# Patient Record
Sex: Male | Born: 1985 | Race: White | Hispanic: No | Marital: Single | Smoking: Current every day smoker
Health system: Southern US, Community
[De-identification: ages and names within clinical notes are randomized; demographics above are authoritative.]

## PROBLEM LIST (undated history)

## (undated) DIAGNOSIS — F411 Generalized anxiety disorder: Secondary | ICD-10-CM

## (undated) DIAGNOSIS — Z8659 Personal history of other mental and behavioral disorders: Secondary | ICD-10-CM

## (undated) DIAGNOSIS — R454 Irritability and anger: Secondary | ICD-10-CM

## (undated) DIAGNOSIS — I10 Essential (primary) hypertension: Secondary | ICD-10-CM

## (undated) DIAGNOSIS — F3289 Other specified depressive episodes: Secondary | ICD-10-CM

## (undated) DIAGNOSIS — F329 Major depressive disorder, single episode, unspecified: Secondary | ICD-10-CM

## (undated) DIAGNOSIS — F988 Other specified behavioral and emotional disorders with onset usually occurring in childhood and adolescence: Secondary | ICD-10-CM

## (undated) DIAGNOSIS — F32A Depression, unspecified: Secondary | ICD-10-CM

## (undated) DIAGNOSIS — F22 Delusional disorders: Secondary | ICD-10-CM

## (undated) HISTORY — PX: HX HERNIA REPAIR: SHX51

## (undated) HISTORY — PX: HERNIA REPAIR: SHX51

---

## 2015-10-21 ENCOUNTER — Emergency Department
Admission: EM | Admit: 2015-10-21 | Discharge: 2015-10-21 | Disposition: A | Discharge status: Home / Self Care | Payer: Self-pay | Attending: Emergency Medicine | Admitting: Emergency Medicine

## 2015-10-21 ENCOUNTER — Encounter: Payer: Self-pay | Admitting: Emergency Medicine

## 2015-10-21 DIAGNOSIS — Z5181 Encounter for therapeutic drug level monitoring: Secondary | ICD-10-CM | POA: Insufficient documentation

## 2015-10-21 DIAGNOSIS — F988 Other specified behavioral and emotional disorders with onset usually occurring in childhood and adolescence: Secondary | ICD-10-CM

## 2015-10-21 DIAGNOSIS — F17210 Nicotine dependence, cigarettes, uncomplicated: Secondary | ICD-10-CM | POA: Insufficient documentation

## 2015-10-21 DIAGNOSIS — F909 Attention-deficit hyperactivity disorder, unspecified type: Secondary | ICD-10-CM | POA: Insufficient documentation

## 2015-10-21 DIAGNOSIS — F341 Dysthymic disorder: Secondary | ICD-10-CM

## 2015-10-21 DIAGNOSIS — F32A Depression, unspecified: Secondary | ICD-10-CM

## 2015-10-21 DIAGNOSIS — F329 Major depressive disorder, single episode, unspecified: Secondary | ICD-10-CM | POA: Insufficient documentation

## 2015-10-21 DIAGNOSIS — R45851 Suicidal ideations: Secondary | ICD-10-CM

## 2015-10-21 HISTORY — DX: Depression, unspecified: F32.A

## 2015-10-21 HISTORY — DX: Delusional disorders: F22

## 2015-10-21 HISTORY — DX: Major depressive disorder, single episode, unspecified: F32.9

## 2015-10-21 HISTORY — DX: Other specified behavioral and emotional disorders with onset usually occurring in childhood and adolescence: F98.8

## 2015-10-21 LAB — SALICYLATE LEVEL: Salicylate Lvl: <7 mg/dL (ref 2.8–30.0)

## 2015-10-21 LAB — URINE DRUG SCREEN, QUALITATIVE (ARMC ONLY)
AMPHETAMINES, UR SCREEN: NOT DETECTED
Barbiturates, Ur Screen: NOT DETECTED
Benzodiazepine, Ur Scrn: NOT DETECTED
CANNABINOID 50 NG, UR ~~LOC~~: NOT DETECTED
Cocaine Metabolite,Ur ~~LOC~~: NOT DETECTED
MDMA (ECSTASY) UR SCREEN: NOT DETECTED
Methadone Scn, Ur: NOT DETECTED
OPIATE, UR SCREEN: NOT DETECTED
PHENCYCLIDINE (PCP) UR S: NOT DETECTED
Tricyclic, Ur Screen: NOT DETECTED

## 2015-10-21 LAB — COMPREHENSIVE METABOLIC PANEL
ALBUMIN: 4.5 g/dL (ref 3.5–5.0)
ALK PHOS: 59 U/L (ref 38–126)
ALT: 20 U/L (ref 17–63)
AST: 19 U/L (ref 15–41)
Anion gap: 7 (ref 5–15)
BILIRUBIN TOTAL: 0.7 mg/dL (ref 0.3–1.2)
BUN: 10 mg/dL (ref 6–20)
CO2: 28 mmol/L (ref 22–32)
CREATININE: 1.19 mg/dL (ref 0.61–1.24)
Calcium: 9.8 mg/dL (ref 8.9–10.3)
Chloride: 104 mmol/L (ref 101–111)
GFR calc Af Amer: >60 mL/min (ref >60)
GLUCOSE: 140 mg/dL — AB (ref 65–99)
Potassium: 4.5 mmol/L (ref 3.5–5.1)
Sodium: 139 mmol/L (ref 135–145)
TOTAL PROTEIN: 7.2 g/dL (ref 6.5–8.1)

## 2015-10-21 LAB — CBC
HEMATOCRIT: 49.5 % (ref 40.0–52.0)
Hemoglobin: 17.1 g/dL (ref 13.0–18.0)
MCH: 31.5 pg (ref 26.0–34.0)
MCHC: 34.5 g/dL (ref 32.0–36.0)
MCV: 91.4 fL (ref 80.0–100.0)
PLATELETS: 212 10*3/uL (ref 150–440)
RBC: 5.41 MIL/uL (ref 4.40–5.90)
RDW: 13.1 % (ref 11.5–14.5)
WBC: 7.6 10*3/uL (ref 3.8–10.6)

## 2015-10-21 LAB — ETHANOL

## 2015-10-21 LAB — ACETAMINOPHEN LEVEL: Acetaminophen (Tylenol), Serum: <10 ug/mL — ABNORMAL LOW (ref 10–30)
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
## 2015-10-21 NOTE — ED Provider Notes (Signed)
-----------------------------------------   4:49 PM on 10/21/2015 -----------------------------------------  The patient has been seen and evaluated by psychiatry and believe the patient is safe for discharge home. Patient has been medically cleared. We'll discharge with [facility] follow-up as needed.   Minna AntisKevin Bodhi Stenglein, MD 10/21/15 1650

## 2015-12-03 ENCOUNTER — Ambulatory Visit: Payer: Self-pay

## 2019-12-28 ENCOUNTER — Other Ambulatory Visit: Payer: Self-pay

## 2019-12-28 ENCOUNTER — Encounter: Payer: Self-pay | Admitting: Emergency Medicine

## 2019-12-28 ENCOUNTER — Emergency Department
Admission: EM | Admit: 2019-12-28 | Discharge: 2019-12-28 | Disposition: A | Discharge status: Home / Self Care | Payer: Self-pay | Attending: Emergency Medicine | Admitting: Emergency Medicine

## 2019-12-28 DIAGNOSIS — J3489 Other specified disorders of nose and nasal sinuses: Secondary | ICD-10-CM | POA: Insufficient documentation

## 2019-12-28 DIAGNOSIS — R059 Cough, unspecified: Secondary | ICD-10-CM | POA: Insufficient documentation

## 2019-12-28 DIAGNOSIS — F17210 Nicotine dependence, cigarettes, uncomplicated: Secondary | ICD-10-CM | POA: Insufficient documentation

## 2019-12-28 DIAGNOSIS — R42 Dizziness and giddiness: Secondary | ICD-10-CM | POA: Insufficient documentation

## 2019-12-28 DIAGNOSIS — J029 Acute pharyngitis, unspecified: Secondary | ICD-10-CM | POA: Insufficient documentation

## 2019-12-28 LAB — CBC
HCT: 47.7 % (ref 39.0–52.0)
Hemoglobin: 15.8 g/dL (ref 13.0–17.0)
MCH: 30.9 pg (ref 26.0–34.0)
MCHC: 33.1 g/dL (ref 30.0–36.0)
MCV: 93.3 fL (ref 80.0–100.0)
Platelets: 232 10*3/uL (ref 150–400)
RBC: 5.11 MIL/uL (ref 4.22–5.81)
RDW: 13 % (ref 11.5–15.5)
WBC: 7.5 10*3/uL (ref 4.0–10.5)
nRBC: 0 % (ref 0.0–0.2)

## 2019-12-28 LAB — BASIC METABOLIC PANEL
Anion gap: 12 (ref 5–15)
BUN: 10 mg/dL (ref 6–20)
CO2: 22 mmol/L (ref 22–32)
Calcium: 8.7 mg/dL — ABNORMAL LOW (ref 8.9–10.3)
Chloride: 105 mmol/L (ref 98–111)
Creatinine, Ser: 0.91 mg/dL (ref 0.61–1.24)
GFR, Estimated: >60 mL/min (ref >60)
Glucose, Bld: 96 mg/dL (ref 70–99)
Potassium: 4.2 mmol/L (ref 3.5–5.1)
Sodium: 139 mmol/L (ref 135–145)

## 2019-12-28 LAB — CBG MONITORING, ED: Glucose-Capillary: 100 mg/dL — ABNORMAL HIGH (ref 70–99)

## 2019-12-28 NOTE — ED Notes (Signed)
Pts UDS collected per [employer]; walked to lab by this tech.

## 2019-12-28 NOTE — ED Provider Notes (Signed)
[facility] Emergency Department Provider Note   ____________________________________________   Event Date/Time   First MD Initiated Contact with Patient 12/28/19 1138     (approximate)  I have reviewed the triage vital signs and the nursing notes.   HISTORY  Chief Complaint Dizziness    HPI Joshua [name] is a 34 y.o. male with a stated past medical history of ADHD who presents for lightheadedness that occurred while at work earlier today.  Patient states that he did have a heavy night of drinking prior to this.  Patient states he is now having lightheadedness with any exertion.  Patient also notes associated cough, rhinorrhea, and mild sore throat that began yesterday and has been worsening.  Patient also states that his supervisor sent paperwork requesting a urine drug screen for Worker's Comp.  Patient was informed that he would need to see a primary care physician.  Patient currently denies any vision changes, tinnitus, difficulty speaking, facial droop, sore throat, chest pain, shortness of breath, abdominal pain, nausea/vomiting/diarrhea, dysuria, or weakness/numbness/paresthesias in any extremity         Past Medical History:  Diagnosis Date   ADD (attention deficit disorder)    Depression    Paranoid (HCC)     Patient Active Problem List   Diagnosis Date Noted   ADD (attention deficit disorder) 10/21/2015   Dysthymia 10/21/2015    Past Surgical History:  Procedure Laterality Date   HERNIA REPAIR      Prior to Admission medications   Not on File    Allergies Patient has no known allergies.  History reviewed. No pertinent family history.  Social History Social History   Tobacco Use   Smoking status: Current Every Day Smoker    Packs/day: 0.50    Types: Cigarettes   Smokeless tobacco: Never Used  Substance Use Topics   Alcohol use: Yes    Comment: occassional 24oz beer at night   Drug use: No    Review of  Systems Constitutional: No fever/chills Eyes: No visual changes. ENT: No sore throat. Cardiovascular: Denies chest pain. Respiratory: Denies shortness of breath. Gastrointestinal: No abdominal pain.  No nausea, no vomiting.  No diarrhea. Genitourinary: Negative for dysuria. Musculoskeletal: Negative for acute arthralgias Skin: Negative for rash. Neurological: Negative for headaches, weakness/numbness/paresthesias in any extremity Psychiatric: Negative for suicidal ideation/homicidal ideation   ____________________________________________   PHYSICAL EXAM:  VITAL SIGNS: ED Triage Vitals [12/28/19 0935]  Enc Vitals Group     BP 124/82     Pulse Rate 79     Resp 18     Temp 99.1 F (37.3 C)     Temp Source Oral     SpO2 99 %     Weight 161 lb (73 kg)     Height 5\' 4"  (1.626 m)     Head Circumference      Peak Flow      Pain Score 7     Pain Loc      Pain Edu?      Excl. in GC?    Constitutional: Alert and oriented. Well appearing and in no acute distress. Eyes: Conjunctivae are normal. PERRL. Head: Atraumatic. Nose: No congestion/rhinnorhea. Mouth/Throat: Mucous membranes are moist.  Erythematous posterior oropharynx Neck: No stridor Cardiovascular: Grossly normal heart sounds.  Good peripheral circulation. Respiratory: Normal respiratory effort.  No retractions. Gastrointestinal: Soft and nontender. No distention. Musculoskeletal: No obvious deformities Neurologic:  Normal speech and language. No gross focal neurologic deficits are appreciated. Skin:  Skin is warm and dry. No rash noted. Psychiatric: Mood and affect are normal. Speech and behavior are normal.  ____________________________________________   LABS (all labs ordered are listed, but only abnormal results are displayed)  Labs Reviewed  BASIC METABOLIC PANEL - Abnormal; Notable for the following components:      Result Value   Calcium 8.7 (*)    All other components within normal limits  CBG  MONITORING, ED - Abnormal; Notable for the following components:   Glucose-Capillary 100 (*)    All other components within normal limits  CBC  URINALYSIS, COMPLETE (UACMP) WITH MICROSCOPIC   ____________________________________________  EKG  ED ECG REPORT I, Merwyn Katos, the attending physician, personally viewed and interpreted this ECG.  Date: 12/28/2019 EKG Time: 0931 Rate: 82 Rhythm: normal sinus rhythm QRS Axis: normal Intervals: normal ST/T Wave abnormalities: normal Narrative Interpretation: no evidence of acute ischemia    PROCEDURES  Procedure(s) performed (including Critical Care):  .1-3 Lead EKG Interpretation Performed by: Merwyn Katos, MD Authorized by: Merwyn Katos, MD     Interpretation: normal     ECG rate:  81   ECG rate assessment: normal     Rhythm: sinus rhythm     Ectopy: none     Conduction: normal       ____________________________________________   INITIAL IMPRESSION / ASSESSMENT AND PLAN / ED COURSE  As part of my medical decision making, I reviewed the following data within the electronic MEDICAL RECORD NUMBER Nursing notes reviewed and incorporated, Labs reviewed, EKG interpreted, Old chart reviewed, and Notes from prior ED visits reviewed and incorporated      Patient presents with complaints of lightheadedness ED Workup:  CBC, BMP, Troponin, ECG Differential diagnosis includes HF, ICH, seizure, stroke, HOCM, ACS, aortic dissection, malignant arrhythmia, or GI bleed. Findings: No evidence of acute laboratory abnormalities.  Troponin negative x1 EKG: No e/o STEMI. No evidence of Brugadas sign, delta wave, epsilon wave, significantly prolonged QTc, or malignant arrhythmia. Patient has history and symptoms concerning for upper respiratory infection.  Patient was offered Covid/flu testing here and denied. Disposition: Discharge. Patient is at baseline at this time. Return precautions expressed and understood in person. Advised  follow up with primary care provider or clinic physician in next 24 hours.     ____________________________________________   FINAL CLINICAL IMPRESSION(S) / ED DIAGNOSES  Final diagnoses:  Lightheadedness  Rhinorrhea  Cough     ED Discharge Orders    None       Note:  This document was prepared using Dragon voice recognition software and may include unintentional dictation errors.   Merwyn Katos, MD 12/28/19 [phone]

## 2019-12-28 NOTE — ED Triage Notes (Signed)
Pt to ED via [facility] with c/o feeling faint and light headed. Per EMS pt was at work and started feeling same. Per EMS pt's supervisor sent paperwork requesting UDS for Worker's Comp.

## 2020-03-28 ENCOUNTER — Emergency Department
Admission: EM | Admit: 2020-03-28 | Discharge: 2020-03-31 | Disposition: A | Discharge status: Home / Self Care | Payer: Self-pay | Attending: Emergency Medicine | Admitting: Emergency Medicine

## 2020-03-28 ENCOUNTER — Other Ambulatory Visit: Payer: Self-pay

## 2020-03-28 DIAGNOSIS — F17210 Nicotine dependence, cigarettes, uncomplicated: Secondary | ICD-10-CM | POA: Insufficient documentation

## 2020-03-28 DIAGNOSIS — T1491XA Suicide attempt, initial encounter: Secondary | ICD-10-CM

## 2020-03-28 DIAGNOSIS — F332 Major depressive disorder, recurrent severe without psychotic features: Secondary | ICD-10-CM | POA: Insufficient documentation

## 2020-03-28 DIAGNOSIS — R45851 Suicidal ideations: Secondary | ICD-10-CM | POA: Insufficient documentation

## 2020-03-28 DIAGNOSIS — F1010 Alcohol abuse, uncomplicated: Secondary | ICD-10-CM

## 2020-03-28 DIAGNOSIS — Z20822 Contact with and (suspected) exposure to covid-19: Secondary | ICD-10-CM | POA: Insufficient documentation

## 2020-03-28 LAB — URINE DRUG SCREEN, QUALITATIVE (ARMC ONLY)
Amphetamines, Ur Screen: NOT DETECTED
Barbiturates, Ur Screen: NOT DETECTED
Benzodiazepine, Ur Scrn: NOT DETECTED
Cannabinoid 50 Ng, Ur ~~LOC~~: NOT DETECTED
Cocaine Metabolite,Ur ~~LOC~~: NOT DETECTED
MDMA (Ecstasy)Ur Screen: NOT DETECTED
Methadone Scn, Ur: NOT DETECTED
Opiate, Ur Screen: NOT DETECTED
Phencyclidine (PCP) Ur S: NOT DETECTED
Tricyclic, Ur Screen: NOT DETECTED

## 2020-03-28 LAB — RESP PANEL BY RT-PCR (FLU A&B, COVID) ARPGX2
Influenza A by PCR: NEGATIVE
Influenza B by PCR: NEGATIVE
SARS Coronavirus 2 by RT PCR: NEGATIVE

## 2020-03-28 LAB — COMPREHENSIVE METABOLIC PANEL
ALT: 22 U/L (ref 0–44)
AST: 21 U/L (ref 15–41)
Albumin: 4.4 g/dL (ref 3.5–5.0)
Alkaline Phosphatase: 53 U/L (ref 38–126)
Anion gap: 9 (ref 5–15)
BUN: 13 mg/dL (ref 6–20)
CO2: 26 mmol/L (ref 22–32)
Calcium: 9.1 mg/dL (ref 8.9–10.3)
Chloride: 105 mmol/L (ref 98–111)
Creatinine, Ser: 0.94 mg/dL (ref 0.61–1.24)
GFR, Estimated: >60 mL/min (ref >60)
Glucose, Bld: 109 mg/dL — ABNORMAL HIGH (ref 70–99)
Potassium: 4.2 mmol/L (ref 3.5–5.1)
Sodium: 140 mmol/L (ref 135–145)
Total Bilirubin: 0.5 mg/dL (ref 0.3–1.2)
Total Protein: 7.1 g/dL (ref 6.5–8.1)

## 2020-03-28 LAB — CBC
HCT: 47.5 % (ref 39.0–52.0)
Hemoglobin: 15.8 g/dL (ref 13.0–17.0)
MCH: 31.3 pg (ref 26.0–34.0)
MCHC: 33.3 g/dL (ref 30.0–36.0)
MCV: 94.2 fL (ref 80.0–100.0)
Platelets: 247 10*3/uL (ref 150–400)
RBC: 5.04 MIL/uL (ref 4.22–5.81)
RDW: 13.3 % (ref 11.5–15.5)
WBC: 6.4 10*3/uL (ref 4.0–10.5)
nRBC: 0 % (ref 0.0–0.2)

## 2020-03-28 LAB — ACETAMINOPHEN LEVEL: Acetaminophen (Tylenol), Serum: <10 ug/mL — ABNORMAL LOW (ref 10–30)

## 2020-03-28 LAB — ETHANOL: Alcohol, Ethyl (B): 188 mg/dL — ABNORMAL HIGH (ref <10)

## 2020-03-28 LAB — SALICYLATE LEVEL: Salicylate Lvl: <7 mg/dL — ABNORMAL LOW (ref 7.0–30.0)

## 2020-03-28 MED ORDER — MELATONIN 3 MG PO TABS
3.0000 mg | ORAL_TABLET | Freq: Every day | ORAL | Status: DC
  Filled 2020-03-28 (×3): qty 1

## 2020-03-28 NOTE — ED Provider Notes (Signed)
[facility] Emergency Department Provider Note  Time seen: 2:57 AM  I have reviewed the triage vital signs and the nursing notes.   HISTORY  Chief Complaint Suicidal   HPI Joshua [name] is a 35 y.o. male the past medical history of depression, presents to the emergency department for suicidal ideation.  According to the patient he called 911 saying that he was going to hang himself.  Per IVC police arrived at his house to find him on top of his lawn more with a electrical cord tied around his neck but he was not hanging from the cord.  He was able to be talked down and they brought him to the emergency department under IVC.  The patient continues to state depression and suicidal thoughts.  Denies any acute event that caused him to feel this way.  No medical complaints.  Does admit to alcohol use.   Past Medical History:  Diagnosis Date  . ADD (attention deficit disorder)   . Depression   . Paranoid [name])     Patient Active Problem List   Diagnosis Date Noted  . ADD (attention deficit disorder) 10/21/2015  . Dysthymia 10/21/2015    Past Surgical History:  Procedure Laterality Date  . HERNIA REPAIR      Prior to Admission medications   Not on File    No Known Allergies  No family history on file.  Social History Social History   Tobacco Use  . Smoking status: Current Every Day Smoker    Packs/day: 0.50    Types: Cigarettes  . Smokeless tobacco: Never Used  Substance Use Topics  . Alcohol use: Yes    Comment: occassional 24oz beer at night  . Drug use: No    Review of Systems Constitutional: Negative for fever. Cardiovascular: Negative for chest pain. Respiratory: Negative for shortness of breath. Gastrointestinal: Negative for abdominal pain Musculoskeletal: Negative for musculoskeletal complaints Neurological: Negative for headache All other ROS negative  ____________________________________________   PHYSICAL EXAM:  VITAL  SIGNS: ED Triage Vitals  Enc Vitals Group     BP 03/28/20 0222 (!) 142/90     Pulse Rate 03/28/20 0222 81     Resp 03/28/20 0222 16     Temp 03/28/20 0222 97.7 F (36.5 C)     Temp Source 03/28/20 0222 Oral     SpO2 03/28/20 0222 98 %     Weight --      Height --      Head Circumference --      Peak Flow --      Pain Score 03/28/20 0221 0     Pain Loc --      Pain Edu? --      Excl. in GC? --     Constitutional: Alert and oriented. Well appearing and in no distress. Eyes: Normal exam ENT      Head: Normocephalic and atraumatic.  Neck appears normal as well, no contusions abrasions or indentations.      Mouth/Throat: Mucous membranes are moist. Cardiovascular: Normal rate, regular rhythm. No murmur Respiratory: Normal respiratory effort without tachypnea nor retractions. Breath sounds are clear  Gastrointestinal: Soft and nontender. No distention.  Musculoskeletal: Nontender with normal range of motion in all extremities.  Neurologic:  Normal speech and language. No gross focal neurologic deficits  Skin:  Skin is warm, dry and intact.  Psychiatric: Light affect  ____________________________________________   INITIAL IMPRESSION / ASSESSMENT AND PLAN / ED COURSE  Pertinent labs & imaging  results that were available during my care of the patient were reviewed by me and considered in my medical decision making (see chart for details).   Patient presents emergency department for worsening depression and suicidal ideation.  Was going to hang himself tonight.  We will maintain the IVC so psychiatry can evaluate.  We will check labs and continue to closely monitor.  Patient has no medical concerns tonight.  Lab work largely nonrevealing besides an alcohol level of 188.  Patient has been seen by psychiatry and they will be admitting to their service once a bed becomes available.  Joshua [name] was evaluated in Emergency Department on 03/28/2020 for the symptoms described in the  history of present illness. He was evaluated in the context of the global COVID-19 pandemic, which necessitated consideration that the patient might be at risk for infection with the SARS-CoV-2 virus that causes COVID-19. Institutional protocols and algorithms that pertain to the evaluation of patients at risk for COVID-19 are in a state of rapid change based on information released by regulatory bodies including the CDC and federal and state organizations. These policies and algorithms were followed during the patient's care in the ED.  The patient has been placed in psychiatric observation due to the need to provide a safe environment for the patient while obtaining psychiatric consultation and evaluation, as well as ongoing medical and medication management to treat the patient's condition.  The patient has been placed under full IVC at this time.  ____________________________________________   FINAL CLINICAL IMPRESSION(S) / ED DIAGNOSES  Suicidal ideation   Minna Antis, MD 03/28/20 [phone]

## 2020-03-28 NOTE — ED Notes (Signed)
Report to include Situation, Background, Assessment, and Recommendations received from Jennifer RN. Patient alert and oriented, warm and dry, in no acute distress. Patient denies SI, HI, AVH and pain. Patient made aware of Q15 minute rounds and security cameras for their safety. Patient instructed to come to me with needs or concerns.  

## 2020-03-28 NOTE — ED Notes (Signed)
Breakfast tray and drink given.  

## 2020-03-28 NOTE — ED Notes (Signed)
IVC  SEEN  BY  DR  CLAPACS  PENDING  PLACEMENT 

## 2020-03-28 NOTE — Consult Note (Signed)
[facility] Face-to-Face Psychiatry Consult   Reason for Consult:Suicidal Referring Physician: Dr. Lenard Lance Patient Identification: Joshua [name] MRN:  [id number] Principal Diagnosis: <principal problem not specified> Diagnosis:  Active Problems:   MDD (major depressive disorder), recurrent episode, severe (HCC)   Total Time spent with patient: 15 minutes  Subjective:   Joshua [name] is a 35 y.o. male patient presented to [facility] ED via BIB [place] [facility] placed under involuntary commitment status (IVC).  Per the ED triage nurse note, Pt states "I want to die." Pt states he tried to hang himself. Pt states [facility] has sent him here before for having suicidal thoughts. Pt states it has gotten worse over the years. Pt BIB [place] [facility] and stated they are taking out IVC paperwork.   The patient was seen face-to-face by this provider; chart reviewed and consulted with Dr. Lenard Lance on 03/28/2020 due to the care of the patient. It was discussed with the EDP that the patient does meet the criteria to be admitted to the psychiatric inpatient unit.  On evaluation the patient  is alert and oriented x 3, calm and cooperative, and mood-congruent with affect. The patient does not appear to be responding to internal or external stimuli. Neither is the patient presenting with any delusional thinking. The patient denies auditory or visual hallucinations. The patient admits to suicidal ideation with a plan to hang himself but denies homicidal, or self-harm ideations. The patient is presenting with some psychotic and paranoid behaviors.   HPI: per Dr. Lenard Lance, Joshua [name] is a 35 y.o. male the past medical history of depression, presents to the emergency department for suicidal ideation.  According to the patient he called 911 saying that he was going to hang himself.  Per IVC police arrived at his house to find him on top of his lawn more with a electrical cord tied around his neck but he was not hanging  from the cord.  He was able to be talked down and they brought him to the emergency department under IVC.  The patient continues to state depression and suicidal thoughts.  Denies any acute event that caused him to feel this way.  No medical complaints.  Does admit to alcohol use.  Past Psychiatric History:  ADD (attention deficit disorder) Depression Paranoid (HCC)  Risk to Self:   Yes Risk to Others:   No Prior Inpatient Therapy:  Yes Prior Outpatient Therapy:  Yes  Past Medical History:  Past Medical History:  Diagnosis Date  . ADD (attention deficit disorder)   . Depression   . Paranoid [facility])     Past Surgical History:  Procedure Laterality Date  . HERNIA REPAIR     Family History: No family history on file. Family Psychiatric  History:  Social History:  Social History   Substance and Sexual Activity  Alcohol Use Yes   Comment: occassional 24oz beer at night     Social History   Substance and Sexual Activity  Drug Use No    Social History   Socioeconomic History  . Marital status: Single    Spouse name: Not on file  . Number of children: Not on file  . Years of education: Not on file  . Highest education level: Not on file  Occupational History  . Not on file  Tobacco Use  . Smoking status: Current Every Day Smoker    Packs/day: 0.50    Types: Cigarettes  . Smokeless tobacco: Never Used  Substance and Sexual Activity  . Alcohol use: Yes  Comment: occassional 24oz beer at night  . Drug use: No  . Sexual activity: Not on file  Other Topics Concern  . Not on file  Social History Narrative  . Not on file   Social Determinants of Health   Financial Resource Strain: Not on file  Food Insecurity: Not on file  Transportation Needs: Not on file  Physical Activity: Not on file  Stress: Not on file  Social Connections: Not on file   Additional Social History:    Allergies:  No Known Allergies  Labs:  Results for orders placed or performed  during the hospital encounter of 03/28/20 (from the past 48 hour(s))  Comprehensive metabolic panel     Status: Abnormal   Collection Time: 03/28/20  2:28 AM  Result Value Ref Range   Sodium 140 135 - 145 mmol/L   Potassium 4.2 3.5 - 5.1 mmol/L   Chloride 105 98 - 111 mmol/L   CO2 26 22 - 32 mmol/L   Glucose, Bld 109 (H) 70 - 99 mg/dL    Comment: Glucose reference range applies only to samples taken after fasting for at least 8 hours.   BUN 13 6 - 20 mg/dL   Creatinine, Ser 5.78 0.61 - 1.24 mg/dL   Calcium 9.1 8.9 - 46.9 mg/dL   Total Protein 7.1 6.5 - 8.1 g/dL   Albumin 4.4 3.5 - 5.0 g/dL   AST 21 15 - 41 U/L   ALT 22 0 - 44 U/L   Alkaline Phosphatase 53 38 - 126 U/L   Total Bilirubin 0.5 0.3 - 1.2 mg/dL   GFR, Estimated >62 >95 mL/min    Comment: (NOTE) Calculated using the CKD-EPI Creatinine Equation (2021)    Anion gap 9 5 - 15    Comment: Performed at [facility], [address] [place], [place] [address]  Ethanol     Status: Abnormal   Collection Time: 03/28/20  2:28 AM  Result Value Ref Range   Alcohol, Ethyl (B) 188 (H) <10 mg/dL    Comment: (NOTE) Lowest detectable limit for serum alcohol is 10 mg/dL.  For medical purposes only. Performed at [facility], [address] [place], [place] [address]   Salicylate level     Status: Abnormal   Collection Time: 03/28/20  2:28 AM  Result Value Ref Range   Salicylate Lvl <7.0 (L) 7.0 - 30.0 mg/dL    Comment: Performed at [facility], [address] [place], [place] [address]  Acetaminophen level     Status: Abnormal   Collection Time: 03/28/20  2:28 AM  Result Value Ref Range   Acetaminophen (Tylenol), Serum <10 (L) 10 - 30 ug/mL    Comment: (NOTE) Therapeutic concentrations vary significantly. A range of 10-30 ug/mL  may be an effective concentration for many patients. However, some  are best treated at concentrations outside of this range. Acetaminophen concentrations >150 ug/mL at  4 hours after ingestion  and >50 ug/mL at 12 hours after ingestion are often associated with  toxic reactions.  Performed at [facility], [address] [place], [place] [address]   cbc     Status: None   Collection Time: 03/28/20  2:28 AM  Result Value Ref Range   WBC 6.4 4.0 - 10.5 K/uL   RBC 5.04 4.22 - 5.81 MIL/uL   Hemoglobin 15.8 13.0 - 17.0 g/dL   HCT 03.4 74.2 - 59.5 %   MCV 94.2 80.0 - 100.0 fL   MCH 31.3  26.0 - 34.0 pg   MCHC 33.3 30.0 - 36.0 g/dL   RDW 14.7 82.9 - 56.2 %   Platelets 247 150 - 400 K/uL   nRBC 0.0 0.0 - 0.2 %    Comment: Performed at [facility], [address] [place], [place] [address]  Urine Drug Screen, Qualitative     Status: None   Collection Time: 03/28/20  3:20 AM  Result Value Ref Range   Tricyclic, Ur Screen NONE DETECTED NONE DETECTED   Amphetamines, Ur Screen NONE DETECTED NONE DETECTED   MDMA (Ecstasy)Ur Screen NONE DETECTED NONE DETECTED   Cocaine Metabolite,Ur [place] NONE DETECTED NONE DETECTED   Opiate, Ur Screen NONE DETECTED NONE DETECTED   Phencyclidine (PCP) Ur S NONE DETECTED NONE DETECTED   Cannabinoid 50 Ng, Ur [place] NONE DETECTED NONE DETECTED   Barbiturates, Ur Screen NONE DETECTED NONE DETECTED   Benzodiazepine, Ur Scrn NONE DETECTED NONE DETECTED   Methadone Scn, Ur NONE DETECTED NONE DETECTED    Comment: (NOTE) Tricyclics + metabolites, urine    Cutoff 1000 ng/mL Amphetamines + metabolites, urine  Cutoff 1000 ng/mL MDMA (Ecstasy), urine              Cutoff 500 ng/mL Cocaine Metabolite, urine          Cutoff 300 ng/mL Opiate + metabolites, urine        Cutoff 300 ng/mL Phencyclidine (PCP), urine         Cutoff 25 ng/mL Cannabinoid, urine                 Cutoff 50 ng/mL Barbiturates + metabolites, urine  Cutoff 200 ng/mL Benzodiazepine, urine              Cutoff 200 ng/mL Methadone, urine                   Cutoff 300 ng/mL  The urine drug screen provides only a preliminary, unconfirmed analytical test  result and should not be used for non-medical purposes. Clinical consideration and professional judgment should be applied to any positive drug screen result due to possible interfering substances. A more specific alternate chemical method must be used in order to obtain a confirmed analytical result. Gas chromatography / mass spectrometry (GC/MS) is the preferred confirm atory method. Performed at [facility], [address] [place], [place] [address]   Resp Panel by RT-PCR (Flu A&B, Covid) Nasopharyngeal Swab     Status: None   Collection Time: 03/28/20  3:20 AM   Specimen: Nasopharyngeal Swab; Nasopharyngeal(NP) swabs in vial transport medium  Result Value Ref Range   SARS Coronavirus 2 by RT PCR NEGATIVE NEGATIVE    Comment: (NOTE) SARS-CoV-2 target nucleic acids are NOT DETECTED.  The SARS-CoV-2 RNA is generally detectable in upper respiratory specimens during the acute phase of infection. The lowest concentration of SARS-CoV-2 viral copies this assay can detect is 138 copies/mL. A negative result does not preclude SARS-Cov-2 infection and should not be used as the sole basis for treatment or other patient management decisions. A negative result may occur with  improper specimen collection/handling, submission of specimen other than nasopharyngeal swab, presence of viral mutation(s) within the areas targeted by this assay, and inadequate number of viral copies(<138 copies/mL). A negative result must be combined with clinical observations, patient history, and epidemiological information. The expected result is Negative.  Fact Sheet for Patients:  BloggerCourse.com  Fact Sheet for Healthcare Providers:  SeriousBroker.it  This test is no t yet  approved or cleared by the [place] FDA and  has been authorized for detection and/or diagnosis of SARS-CoV-2 by FDA under an Emergency Use Authorization (EUA). This EUA  will remain  in effect (meaning this test can be used) for the duration of the COVID-19 declaration under Section 564(b)(1) of the Act, 21 U.S.C.section 360bbb-3(b)(1), unless the authorization is terminated  or revoked sooner.       Influenza A by PCR NEGATIVE NEGATIVE   Influenza B by PCR NEGATIVE NEGATIVE    Comment: (NOTE) The Xpert Xpress SARS-CoV-2/FLU/RSV plus assay is intended as an aid in the diagnosis of influenza from Nasopharyngeal swab specimens and should not be used as a sole basis for treatment. Nasal washings and aspirates are unacceptable for Xpert Xpress SARS-CoV-2/FLU/RSV testing.  Fact Sheet for Patients: BloggerCourse.comhttps://www.fda.gov/media/152166/download  Fact Sheet for Healthcare Providers: SeriousBroker.ithttps://www.fda.gov/media/152162/download  This test is not yet approved or cleared by the [place] FDA and has been authorized for detection and/or diagnosis of SARS-CoV-2 by FDA under an Emergency Use Authorization (EUA). This EUA will remain in effect (meaning this test can be used) for the duration of the COVID-19 declaration under Section 564(b)(1) of the Act, 21 U.S.C. section 360bbb-3(b)(1), unless the authorization is terminated or revoked.  Performed at [facility], [address] [place], [place] [id number]     No current facility-administered medications for this encounter.   No current outpatient medications on file.    Musculoskeletal: Strength & Muscle Tone: within normal limits Gait & Station: normal Patient leans: N/A Psychiatric Specialty Exam:  Presentation  General Appearance: Bizarre  Eye Contact:Minimal  Speech:Garbled; Slow  Speech Volume:Decreased  Handedness:Right   Mood and Affect  Mood:Hopeless  Affect:Blunt; Congruent; Depressed   Thought Process  Thought Processes:Coherent  Descriptions of Associations:Loose  Orientation:Full (Time, Place and Person)  Thought Content:Logical  History of  Schizophrenia/Schizoaffective disorder:No data recorded Duration of Psychotic Symptoms:No data recorded Hallucinations:Hallucinations: None  Ideas of Reference:None  Suicidal Thoughts:Suicidal Thoughts: Yes, Active SI Active Intent and/or Plan: With Intent; With Plan; With Means to Carry Out  Homicidal Thoughts:Homicidal Thoughts: No   Sensorium  Memory:Immediate Good; Recent Good; Remote Good  Judgment:Impaired  Insight:Lacking   Executive Functions  Concentration:Poor  Attention Span:Fair  Recall:Fair  Fund of Knowledge:Fair  Language:Fair   Psychomotor Activity  Psychomotor Activity:Psychomotor Activity: Normal   Assets  Assets:Communication Skills; Desire for Improvement; Social Support; Resilience   Sleep  Sleep:Sleep: Fair   Physical Exam: Physical Exam Vitals and nursing note reviewed.  HENT:     Right Ear: External ear normal.     Left Ear: External ear normal.     Nose: Nose normal.  Cardiovascular:     Rate and Rhythm: Tachycardia present.  Pulmonary:     Effort: Pulmonary effort is normal.  Musculoskeletal:        General: Normal range of motion.     Cervical back: Normal range of motion and neck supple.  Neurological:     Mental Status: He is alert and oriented to person, place, and time.  Psychiatric:        Attention and Perception: Attention normal.        Mood and Affect: Mood is anxious and depressed.        Speech: Speech is delayed.        Behavior: Behavior is cooperative.        Thought Content: Thought content includes suicidal ideation. Thought content includes suicidal plan.  Cognition and Memory: Cognition and memory normal.        Judgment: Judgment is impulsive and inappropriate.    Review of Systems  Psychiatric/Behavioral: Positive for depression and suicidal ideas. The patient is nervous/anxious.   All other systems reviewed and are negative.  Blood pressure (!) 142/90, pulse 81, temperature 97.7 F (36.5  C), temperature source Oral, resp. rate 16, SpO2 98 %. There is no height or weight on file to calculate BMI.  Treatment Plan Summary: Plan The patient is a safety risk to himself and and requires psychiatric inpatient admission for stabilization and treatment.  Disposition: Recommend psychiatric Inpatient admission when medically cleared. Supportive therapy provided about ongoing stressors.  Gillermo Murdoch, NP 03/28/2020 5:58 AM

## 2020-03-28 NOTE — ED Notes (Signed)
Pt belongings:  1 pair brown boots 1 pair grey socks.  1 cell phone 1 black jacket 1 black shirt 1 pair blue pants 1 blue and white pair underwear.

## 2020-03-28 NOTE — ED Notes (Signed)
Pt given the phone at this time.  

## 2020-03-28 NOTE — ED Notes (Signed)
Hourly rounding reveals patient in room. No complaints, stable, in no acute distress. Q15 minute rounds and monitoring via Security Cameras to continue. 

## 2020-03-28 NOTE — Consult Note (Signed)
[facility] Face-to-Face Psychiatry Consult   Reason for Consult: Consult follow-up 35 year old man presented to the hospital after a suicide attempt Referring Physician: Scotty Court Patient Identification: Joshua [name] MRN:  [id number] Principal Diagnosis: MDD (major depressive disorder), recurrent episode, severe (HCC) Diagnosis:  Principal Problem:   MDD (major depressive disorder), recurrent episode, severe (HCC) Active Problems:   Alcohol abuse   Suicide attempt (HCC)   Total Time spent with patient: 30 minutes  Subjective:   Joshua [name] is a 35 y.o. male patient admitted with "I tried to kill myself".  HPI: Patient seen chart reviewed.  35 year old man presented to the hospital after trying to hang himself.  Called 911 himself but when they came he had wrapped an electrical cord around his neck and had to be cut down.  Patient says mood is been extremely depressed for a long time but is been getting worse for the last few weeks.  Energy level low.  Sleeping poorly.  Does not site any specific stressors other than being estranged from his family working 2 jobs feeling lonely having little support.  He is drinking alcohol daily and had a blood alcohol level of 188 on presentation.  Does not use other drugs other than very infrequent marijuana.  No medical problems currently taking no medicine getting no outpatient treatment.  No history of seizures or DTs.  Past Psychiatric History: Patient has presented with depression in the past.  Alcohol problem seems to a developed over the last couple years.  Has been referred for outpatient treatment but has not followed up for years.  Risk to Self:   Risk to Others:   Prior Inpatient Therapy:   Prior Outpatient Therapy:    Past Medical History:  Past Medical History:  Diagnosis Date  . ADD (attention deficit disorder)   . Depression   . Paranoid [facility])     Past Surgical History:  Procedure Laterality Date  . HERNIA REPAIR     Family  History: No family history on file. Family Psychiatric  History: None known Social History:  Social History   Substance and Sexual Activity  Alcohol Use Yes   Comment: occassional 24oz beer at night     Social History   Substance and Sexual Activity  Drug Use No    Social History   Socioeconomic History  . Marital status: Single    Spouse name: Not on file  . Number of children: Not on file  . Years of education: Not on file  . Highest education level: Not on file  Occupational History  . Not on file  Tobacco Use  . Smoking status: Current Every Day Smoker    Packs/day: 0.50    Types: Cigarettes  . Smokeless tobacco: Never Used  Substance and Sexual Activity  . Alcohol use: Yes    Comment: occassional 24oz beer at night  . Drug use: No  . Sexual activity: Not on file  Other Topics Concern  . Not on file  Social History Narrative  . Not on file   Social Determinants of Health   Financial Resource Strain: Not on file  Food Insecurity: Not on file  Transportation Needs: Not on file  Physical Activity: Not on file  Stress: Not on file  Social Connections: Not on file   Additional Social History:    Allergies:  No Known Allergies  Labs:  Results for orders placed or performed during the hospital encounter of 03/28/20 (from the past 48 hour(s))  Comprehensive metabolic panel  Status: Abnormal   Collection Time: 03/28/20  2:28 AM  Result Value Ref Range   Sodium 140 135 - 145 mmol/L   Potassium 4.2 3.5 - 5.1 mmol/L   Chloride 105 98 - 111 mmol/L   CO2 26 22 - 32 mmol/L   Glucose, Bld 109 (H) 70 - 99 mg/dL    Comment: Glucose reference range applies only to samples taken after fasting for at least 8 hours.   BUN 13 6 - 20 mg/dL   Creatinine, Ser 2.63 0.61 - 1.24 mg/dL   Calcium 9.1 8.9 - 78.5 mg/dL   Total Protein 7.1 6.5 - 8.1 g/dL   Albumin 4.4 3.5 - 5.0 g/dL   AST 21 15 - 41 U/L   ALT 22 0 - 44 U/L   Alkaline Phosphatase 53 38 - 126 U/L   Total  Bilirubin 0.5 0.3 - 1.2 mg/dL   GFR, Estimated >88 >50 mL/min    Comment: (NOTE) Calculated using the CKD-EPI Creatinine Equation (2021)    Anion gap 9 5 - 15    Comment: Performed at [facility], [address], [place], [place] [address]  Ethanol     Status: Abnormal   Collection Time: 03/28/20  2:28 AM  Result Value Ref Range   Alcohol, Ethyl (B) 188 (H) <10 mg/dL    Comment: (NOTE) Lowest detectable limit for serum alcohol is 10 mg/dL.  For medical purposes only. Performed at [facility], [address] [place], [place] [address]   Salicylate level     Status: Abnormal   Collection Time: 03/28/20  2:28 AM  Result Value Ref Range   Salicylate Lvl <7.0 (L) 7.0 - 30.0 mg/dL    Comment: Performed at [facility], [address] [place], [place] [address]  Acetaminophen level     Status: Abnormal   Collection Time: 03/28/20  2:28 AM  Result Value Ref Range   Acetaminophen (Tylenol), Serum <10 (L) 10 - 30 ug/mL    Comment: (NOTE) Therapeutic concentrations vary significantly. A range of 10-30 ug/mL  may be an effective concentration for many patients. However, some  are best treated at concentrations outside of this range. Acetaminophen concentrations >150 ug/mL at 4 hours after ingestion  and >50 ug/mL at 12 hours after ingestion are often associated with  toxic reactions.  Performed at [facility], [address] [place], [place] [address]   cbc     Status: None   Collection Time: 03/28/20  2:28 AM  Result Value Ref Range   WBC 6.4 4.0 - 10.5 K/uL   RBC 5.04 4.22 - 5.81 MIL/uL   Hemoglobin 15.8 13.0 - 17.0 g/dL   HCT 62.8 36.6 - 29.4 %   MCV 94.2 80.0 - 100.0 fL   MCH 31.3 26.0 - 34.0 pg   MCHC 33.3 30.0 - 36.0 g/dL   RDW 76.5 46.5 - 03.5 %   Platelets 247 150 - 400 K/uL   nRBC 0.0 0.0 - 0.2 %    Comment: Performed at [facility], [address], [place], [place] [address]  Urine Drug Screen, Qualitative      Status: None   Collection Time: 03/28/20  3:20 AM  Result Value Ref Range   Tricyclic, Ur Screen NONE DETECTED NONE DETECTED   Amphetamines, Ur Screen NONE DETECTED NONE DETECTED   MDMA (Ecstasy)Ur Screen NONE DETECTED NONE DETECTED   Cocaine Metabolite,Ur [place] NONE DETECTED NONE DETECTED   Opiate, Ur Screen NONE DETECTED NONE  DETECTED   Phencyclidine (PCP) Ur S NONE DETECTED NONE DETECTED   Cannabinoid 50 Ng, Ur [place] NONE DETECTED NONE DETECTED   Barbiturates, Ur Screen NONE DETECTED NONE DETECTED   Benzodiazepine, Ur Scrn NONE DETECTED NONE DETECTED   Methadone Scn, Ur NONE DETECTED NONE DETECTED    Comment: (NOTE) Tricyclics + metabolites, urine    Cutoff 1000 ng/mL Amphetamines + metabolites, urine  Cutoff 1000 ng/mL MDMA (Ecstasy), urine              Cutoff 500 ng/mL Cocaine Metabolite, urine          Cutoff 300 ng/mL Opiate + metabolites, urine        Cutoff 300 ng/mL Phencyclidine (PCP), urine         Cutoff 25 ng/mL Cannabinoid, urine                 Cutoff 50 ng/mL Barbiturates + metabolites, urine  Cutoff 200 ng/mL Benzodiazepine, urine              Cutoff 200 ng/mL Methadone, urine                   Cutoff 300 ng/mL  The urine drug screen provides only a preliminary, unconfirmed analytical test result and should not be used for non-medical purposes. Clinical consideration and professional judgment should be applied to any positive drug screen result due to possible interfering substances. A more specific alternate chemical method must be used in order to obtain a confirmed analytical result. Gas chromatography / mass spectrometry (GC/MS) is the preferred confirm atory method. Performed at [facility], [address] [place], [place] [address]   Resp Panel by RT-PCR (Flu A&B, Covid) Nasopharyngeal Swab     Status: None   Collection Time: 03/28/20  3:20 AM   Specimen: Nasopharyngeal Swab; Nasopharyngeal(NP) swabs in vial transport medium  Result Value Ref Range    SARS Coronavirus 2 by RT PCR NEGATIVE NEGATIVE    Comment: (NOTE) SARS-CoV-2 target nucleic acids are NOT DETECTED.  The SARS-CoV-2 RNA is generally detectable in upper respiratory specimens during the acute phase of infection. The lowest concentration of SARS-CoV-2 viral copies this assay can detect is 138 copies/mL. A negative result does not preclude SARS-Cov-2 infection and should not be used as the sole basis for treatment or other patient management decisions. A negative result may occur with  improper specimen collection/handling, submission of specimen other than nasopharyngeal swab, presence of viral mutation(s) within the areas targeted by this assay, and inadequate number of viral copies(<138 copies/mL). A negative result must be combined with clinical observations, patient history, and epidemiological information. The expected result is Negative.  Fact Sheet for Patients:  BloggerCourse.com  Fact Sheet for Healthcare Providers:  SeriousBroker.it  This test is no t yet approved or cleared by the [place] FDA and  has been authorized for detection and/or diagnosis of SARS-CoV-2 by FDA under an Emergency Use Authorization (EUA). This EUA will remain  in effect (meaning this test can be used) for the duration of the COVID-19 declaration under Section 564(b)(1) of the Act, 21 U.S.C.section 360bbb-3(b)(1), unless the authorization is terminated  or revoked sooner.       Influenza A by PCR NEGATIVE NEGATIVE   Influenza B by PCR NEGATIVE NEGATIVE    Comment: (NOTE) The Xpert Xpress SARS-CoV-2/FLU/RSV plus assay is intended as an aid in the diagnosis of influenza from Nasopharyngeal swab specimens and should not be used as a sole basis for  treatment. Nasal washings and aspirates are unacceptable for Xpert Xpress SARS-CoV-2/FLU/RSV testing.  Fact Sheet for Patients: BloggerCourse.comhttps://www.fda.gov/media/152166/download  Fact  Sheet for Healthcare Providers: SeriousBroker.ithttps://www.fda.gov/media/152162/download  This test is not yet approved or cleared by the [place] FDA and has been authorized for detection and/or diagnosis of SARS-CoV-2 by FDA under an Emergency Use Authorization (EUA). This EUA will remain in effect (meaning this test can be used) for the duration of the COVID-19 declaration under Section 564(b)(1) of the Act, 21 U.S.C. section 360bbb-3(b)(1), unless the authorization is terminated or revoked.  Performed at [facility], [address] [place], [place] [id number]     No current facility-administered medications for this encounter.   No current outpatient medications on file.    Musculoskeletal: Strength & Muscle Tone: within normal limits Gait & Station: normal Patient leans: N/A            Psychiatric Specialty Exam:  Presentation  General Appearance: Bizarre  Eye Contact:Minimal  Speech:Garbled; Slow  Speech Volume:Decreased  Handedness:Right   Mood and Affect  Mood:Hopeless  Affect:Blunt; Congruent; Depressed   Thought Process  Thought Processes:Coherent  Descriptions of Associations:Loose  Orientation:Full (Time, Place and Person)  Thought Content:Logical  History of Schizophrenia/Schizoaffective disorder:No  Duration of Psychotic Symptoms:No data recorded Hallucinations:Hallucinations: None  Ideas of Reference:None  Suicidal Thoughts:Suicidal Thoughts: Yes, Active SI Active Intent and/or Plan: With Intent; With Plan; With Means to Carry Out  Homicidal Thoughts:Homicidal Thoughts: No   Sensorium  Memory:Immediate Good; Recent Good; Remote Good  Judgment:Impaired  Insight:Lacking   Executive Functions  Concentration:Poor  Attention Span:Fair  Recall:Fair  Fund of Knowledge:Fair  Language:Fair   Psychomotor Activity  Psychomotor Activity:Psychomotor Activity: Normal   Assets  Assets:Communication Skills; Desire  for Improvement; Social Support; Resilience   Sleep  Sleep:Sleep: Fair   Physical Exam: Physical Exam Vitals and nursing note reviewed.  Constitutional:      Appearance: Normal appearance.  HENT:     Head: Normocephalic and atraumatic.     Mouth/Throat:     Pharynx: Oropharynx is clear.  Eyes:     Pupils: Pupils are equal, round, and reactive to light.  Cardiovascular:     Rate and Rhythm: Normal rate and regular rhythm.  Pulmonary:     Effort: Pulmonary effort is normal.     Breath sounds: Normal breath sounds.  Abdominal:     General: Abdomen is flat.     Palpations: Abdomen is soft.  Musculoskeletal:        General: Normal range of motion.  Skin:    General: Skin is warm and dry.  Neurological:     General: No focal deficit present.     Mental Status: He is alert. Mental status is at baseline.  Psychiatric:        Attention and Perception: He is inattentive.        Mood and Affect: Mood is depressed. Affect is blunt.        Speech: Speech is delayed.        Behavior: Behavior is slowed.        Thought Content: Thought content includes suicidal ideation. Thought content includes suicidal plan.        Cognition and Memory: Cognition is impaired. Memory is impaired.        Judgment: Judgment is impulsive.    Review of Systems  Constitutional: Negative.   HENT: Negative.   Eyes: Negative.   Respiratory: Negative.   Cardiovascular: Negative.   Gastrointestinal: Negative.   Musculoskeletal:  Negative.   Skin: Negative.   Neurological: Negative.   Psychiatric/Behavioral: Positive for depression, substance abuse and suicidal ideas. The patient is nervous/anxious.    Blood pressure (!) 142/90, pulse 81, temperature 97.7 F (36.5 C), temperature source Oral, resp. rate 16, SpO2 98 %. There is no height or weight on file to calculate BMI.  Treatment Plan Summary: Medication management and Plan 35 year old man with severe major depression without psychotic features  and suicide attempt continues to have suicidal ideation.  Alcohol abuse intoxicated but not delirious no history of complicated withdrawal.  Patient is under IVC.  Recommend continue under IVC monitor for any symptoms of withdrawal.  Needs inpatient psychiatric hospitalization for depression and suicidality.  Recommend referral out to inpatient beds.  Disposition: Recommend psychiatric Inpatient admission when medically cleared. Supportive therapy provided about ongoing stressors.  Mordecai Rasmussen, MD 03/28/2020 11:42 AM

## 2020-03-28 NOTE — ED Notes (Signed)
Snack and beverage given. 

## 2020-03-28 NOTE — ED Triage Notes (Signed)
Pt states "I want to die." Pt states he tried to hang himself. Pt states [facility] has sent him here before for having suicidal thoughts. Pt states it has gotten worse over the years.   Pt BIB [facility] and stated they are taking out IVC paperwork.

## 2020-03-28 NOTE — ED Provider Notes (Signed)
Emergency Medicine Observation Re-evaluation Note  Joshua [name] is a 35 y.o. male, seen on rounds today.  Pt initially presented to the ED for complaints of Suicidal  Currently, the patient is calm, no acute complaints.  Physical Exam  Blood pressure (!) 155/85, pulse 77, temperature 98.4 F (36.9 C), temperature source Oral, resp. rate 16, SpO2 96 %. Physical Exam General: NAD Lungs: CTAB Psych: not agitated  ED Course / MDM  EKG:    I have reviewed the labs performed to date as well as medications administered while in observation.  Recent changes in the last 24 hours include no acute events overnight.  Psychiatry reassessed today, continues to recommend inpatient treatment  Plan  Current plan is for inpatient psychiatric treatment. Patient is under full IVC at this time.   Sharman Cheek, MD 03/28/20 1435

## 2020-03-28 NOTE — BH Assessment (Signed)
Referral information for Psychiatric Hospitalization faxed to;   . [facility] ([phone]-or- [phone]),   . [facility] ([phone]--[phone])  . [facility] ([phone]---[phone]---[phone]),  . [facility] ([phone], [phone], [phone] or [phone]),   . [facility] ([phone] or [phone])  . [facility] ([phone]),   . [facility] ([phone] -or- [phone]),   . [facility] ([phone]). 

## 2020-03-28 NOTE — ED Notes (Signed)
Lunch meal tray given.  

## 2020-03-28 NOTE — BH Assessment (Signed)
Comprehensive Clinical Assessment (CCA) Note  03/28/2020 Joshua [name] [id number]   Recommendations for Services/Supports/Treatments: Per Madaline Brilliant., NP, pt. is recommended for inpatient treatment.   Joshua [name] is a 35 year old patient who presented to [facility] ED under IVC due to SI. Pt was noted to have soft, barely intelligible speech. Pt was not expansive and answered with short responses. Pt presents with thought processes that were preoccupied with not wanting to be alive. Pt presented with a casual appearance. When asked what brought him to the hospital the pt. stated, "I tried to hang myself" Pt presented with a depressed mood and a flat affect. The patient was calm and cooperative throughout the interview. Pt reported his last alcohol use was 03/27/20. Pt presents with a BAL of 188. Pt presents with poor insight and judgment. Pt denied SI, paranoia, HI, and AV/H. Pt refused to answer questions for the remainder of the assessment.  Flowsheet Row ED from 03/28/2020 in [facility] EMERGENCY DEPARTMENT  C-SSRS RISK CATEGORY High Risk    The patient demonstrates the following risk factors for suicide: Chronic risk factors for suicide include: psychiatric disorder of Major Depressive Disorder. Acute risk factors for suicide include: N/A. Protective factors for this patient include: n/a. Considering these factors, the overall suicide risk at this point appears to be high. Patient is not appropriate for outpatient follow up.  Chief Complaint:  Chief Complaint  Patient presents with  . Suicidal   Visit Diagnosis: Major Depressive Disorder, recurrent severe    CCA Screening, Triage and Referral (STR)  Patient Reported Information How did you hear about [place]? Other (Comment)  Referral name: [facility]  Referral phone number: [phone]   Whom do you see for routine medical problems? I don't have a doctor  Practice/Facility Name: No data recorded Practice/Facility Phone  Number: No data recorded Name of Contact: No data recorded Contact Number: No data recorded Contact Fax Number: No data recorded Prescriber Name: No data recorded Prescriber Address (if known): No data recorded  What Is the Reason for Your Visit/Call Today? Suicidal ideation  How Long Has This Been Causing You Problems? -- (Unable to quantify)  What Do You Feel Would Help You the Most Today? -- (UTA)   Have You Recently Been in Any Inpatient Treatment (Hospital/Detox/Crisis Center/28-Day Program)? No  Name/Location of Program/Hospital:No data recorded How Long Were You There? No data recorded When Were You Discharged? No data recorded  Have You Ever Received Services From [facility] Before? No  Who Do You See at [facility]? No data recorded  Have You Recently Had Any Thoughts About Hurting Yourself? Yes  Are You Planning to Commit Suicide/Harm Yourself At This time? No   Have you Recently Had Thoughts About Hurting Someone Joshua [name]? No  Explanation: No data recorded  Have You Used Any Alcohol or Drugs in the Past 24 Hours? No  How Long Ago Did You Use Drugs or Alcohol? No data recorded What Did You Use and How Much? No data recorded  Do You Currently Have a Therapist/Psychiatrist? No data recorded Name of Therapist/Psychiatrist: No data recorded  Have You Been Recently Discharged From Any Office Practice or Programs? No  Explanation of Discharge From Practice/Program: No data recorded    CCA Screening Triage Referral Assessment Type of Contact: No data recorded Is this Initial or Reassessment? No data recorded Date Telepsych consult ordered in [facility]:  No data recorded Time Telepsych consult ordered in [facility]:  No data recorded  Patient Reported Information Reviewed?  Yes  Patient Left Without Being Seen? No data recorded Reason for Not Completing Assessment: No data recorded  Collateral Involvement: No data recorded  Does Patient Have a Court Appointed Legal  Guardian? No data recorded Name and Contact of Legal Guardian: No data recorded If Minor and Not Living with Parent(s), Who has Custody? No data recorded Is CPS involved or ever been involved? Never  Is APS involved or ever been involved? Never   Patient Determined To Be At Risk for Harm To Self or Others Based on Review of Patient Reported Information or Presenting Complaint? Yes, for Self-Harm  Method: No data recorded Availability of Means: No data recorded Intent: No data recorded Notification Required: No data recorded Additional Information for Danger to Others Potential: No data recorded Additional Comments for Danger to Others Potential: No data recorded Are There Guns or Other Weapons in Your Home? No data recorded Types of Guns/Weapons: No data recorded Are These Weapons Safely Secured?                            No data recorded Who Could Verify You Are Able To Have These Secured: No data recorded Do You Have any Outstanding Charges, Pending Court Dates, Parole/Probation? No data recorded Contacted To Inform of Risk of Harm To Self or Others: No data recorded  Location of Assessment: [facility] ED   Does Patient Present under Involuntary Commitment? Yes  IVC Papers Initial File Date: 03/28/2020   [place] of Residence: [place]   Patient Currently Receiving the Following Services: No data recorded  Determination of Need: Urgent (48 hours)   Options For Referral: Inpatient Hospitalization     CCA Biopsychosocial Intake/Chief Complaint:  Suicidal ideation  Current Symptoms/Problems: Pt reports no wanting to live   Patient Reported Schizophrenia/Schizoaffective Diagnosis in Past: No   Strengths: Pt has insight into his problems  Preferences: None noted  Abilities: Able to communicate feelings   Type of Services Patient Feels are Needed: Unable to specify   Initial Clinical Notes/Concerns: No data recorded  Mental Health Symptoms Depression:   Hopelessness; Worthlessness; Tearfulness; Change in energy/activity   Duration of Depressive symptoms: Greater than two weeks   Mania:  None   Anxiety:   Tension   Psychosis:  None   Duration of Psychotic symptoms: No data recorded  Trauma:  None   Obsessions:  None   Compulsions:  None   Inattention:  N/A   Hyperactivity/Impulsivity:  N/A   Oppositional/Defiant Behaviors:  N/A   Emotional Irregularity:  Recurrent suicidal behaviors/gestures/threats   Other Mood/Personality Symptoms:  No data recorded   Mental Status Exam Appearance and self-care  Stature:  Average   Weight:  Average weight   Clothing:  Casual   Grooming:  Normal   Cosmetic use:  None   Posture/gait:  Normal   Motor activity:  Slowed   Sensorium  Attention:  Normal   Concentration:  Normal   Orientation:  X5   Recall/memory:  Normal   Affect and Mood  Affect:  Depressed   Mood:  Depressed   Relating  Eye contact:  Avoided   Facial expression:  Depressed   Attitude toward examiner:  Cooperative   Thought and Language  Speech flow: Soft   Thought content:  Appropriate to Mood and Circumstances   Preoccupation:  Suicide   Hallucinations:  None   Organization:  No data recorded  [employer] of Knowledge:  Average  Intelligence:  Average   Abstraction:  Normal   Judgement:  Poor   Reality Testing:  Adequate   Insight:  No data recorded  Decision Making:  Impulsive   Social Functioning  Social Maturity:  Impulsive   Social Judgement:  Normal   Stress  Stressors:  -- Industrial/product designer)   Coping Ability:  Exhausted   Skill Deficits:  Decision making   Supports:  Support needed     Religion:    Leisure/Recreation:    Exercise/Diet: Exercise/Diet Do You Have Any Trouble Sleeping?: No   CCA Employment/Education Employment/Work Situation:    Education:     CCA Family/Childhood History Family and Relationship History: Family  history Marital status: Single Are you sexually active?:  (UTA)  Childhood History:     Child/Adolescent Assessment:     CCA Substance Use Alcohol/Drug Use: Alcohol / Drug Use Pain Medications: See PTA Prescriptions: See PTA History of alcohol / drug use?: Yes Longest period of sobriety (when/how long): Unable to quantify Negative Consequences of Use: Personal relationships Withdrawal Symptoms: Other (Comment) (None noted) Substance #1 Name of Substance 1: Alcohol 1 - Frequency: Daily 1 - Last Use / Amount: 03/27/20                       ASAM's:  Six Dimensions of Multidimensional Assessment  Dimension 1:  Acute Intoxication and/or Withdrawal Potential:      Dimension 2:  Biomedical Conditions and Complications:      Dimension 3:  Emotional, Behavioral, or Cognitive Conditions and Complications:     Dimension 4:  Readiness to Change:     Dimension 5:  Relapse, Continued use, or Continued Problem Potential:     Dimension 6:  Recovery/Living Environment:     ASAM Severity Score:    ASAM Recommended Level of Treatment:     Substance use Disorder (SUD)    Recommendations for Services/Supports/Treatments:    DSM5 Diagnoses: Patient Active Problem List   Diagnosis Date Noted  . MDD (major depressive disorder), recurrent episode, severe (HCC) 03/28/2020  . ADD (attention deficit disorder) 10/21/2015  . Dysthymia 10/21/2015    Joshua [name] R Fonnie Crookshanks, LCAS

## 2020-03-29 MED ORDER — MELATONIN 5 MG PO TABS
2.5000 mg | ORAL_TABLET | Freq: Every day | ORAL | Status: DC
  Administered 2020-03-29 – 2020-03-30 (×2): 2.5 mg via ORAL
  Filled 2020-03-29 (×2): qty 1

## 2020-03-29 NOTE — ED Notes (Signed)
Hourly rounding reveals patient in room. No complaints, stable, in no acute distress. Q15 minute rounds and monitoring via Security Cameras to continue. 

## 2020-03-29 NOTE — ED Notes (Signed)
Pt asleep. Will obtain vitals once awake.  

## 2020-03-29 NOTE — ED Notes (Signed)
Pt given breakfast tray. States that room was hot but pt got some sleep. States that he "feels the same" as yesterday. Calm, cooperative, pleasant.

## 2020-03-29 NOTE — BHH Counselor (Signed)
[facility] Counselor called [facility] to follow up on referral; Coordinator states they have 22 admissions today; Admission will call back on Sunday regarding status; 09:35AM  Joshua [name] TTS

## 2020-03-29 NOTE — ED Provider Notes (Signed)
Emergency Medicine Observation Re-evaluation Note  Joshua [name] is a 35 y.o. male, seen on rounds today.  Pt initially presented to the ED for complaints of Suicidal Currently, the patient is laying comfortably in bed, denies complaints.  Physical Exam  BP 135/71   Pulse (!) 55   Temp 97.9 F (36.6 C) (Oral)   Resp 18   SpO2 98%  Physical Exam Constitutional: Resting comfortably. Eyes: Conjunctivae are normal. Head: Atraumatic. Nose: No congestion/rhinnorhea. Mouth/Throat: Mucous membranes are moist. Neck: Normal ROM Cardiovascular: No cyanosis noted. Respiratory: Normal respiratory effort. Gastrointestinal: Non-distended. Genitourinary: deferred Musculoskeletal: No lower extremity tenderness nor edema. Neurologic:  Normal speech and language. No gross focal neurologic deficits are appreciated. Skin:  Skin is warm, dry and intact. No rash noted.    ED Course / MDM  EKG:   I have reviewed the labs performed to date as well as medications administered while in observation.  Recent changes in the last 24 hours include none.  Plan  Current plan is for psychiatric admission pending placement. Patient is under full IVC at this time.   Chesley Noon, MD 03/29/20 [phone]

## 2020-03-29 NOTE — ED Notes (Signed)
Report to include Situation, Background, Assessment, and Recommendations received from Sarah RN. Patient alert and oriented, warm and dry, in no acute distress. Patient denies SI, HI, AVH and pain. Patient made aware of Q15 minute rounds and security cameras for their safety. Patient instructed to come to me with needs or concerns.  

## 2020-03-29 NOTE — BH Assessment (Signed)
Referral checks:   Joshua [name] ([phone]- [phone]), Per Kenard Gower, pt denied due to financial.   Joshua [name] ([phone]--[phone]) no answer/option to leave voicemail.   Joshua [name] ([phone]---[phone]), Per Tamela Oddi, there are no intake staff available. Advised to try again later.   Joshua [name] [phone], [phone] or [phone]), no answer   Joshua [name] [phone] or [phone]) Joshua [name] requested a refax. Task completed at: 6:20 AM   [facility] [phone]), Joshua [name] pt's are currently on the wait list. Follow up needed.   Joshua [name] [phone] -or- [phone]), Joshua [name] requested a refax. Task completed at: 6:20 AM   Joshua [name] [phone]). no answer; mailbox is full

## 2020-03-29 NOTE — ED Notes (Signed)
Pt given meal tray.

## 2020-03-30 NOTE — ED Notes (Signed)
Hourly rounding reveals patient in room. No complaints, stable, in no acute distress. Q15 minute rounds and monitoring via Security Cameras to continue. 

## 2020-03-30 NOTE — ED Provider Notes (Signed)
Emergency Medicine Observation Re-evaluation Note  Joshua [name] is a 35 y.o. male, seen on rounds today.  Pt initially presented to the ED for complaints of Suicidal Currently, the patient is resting.  Physical Exam  BP 133/84   Pulse 64   Temp 98.3 F (36.8 C) (Oral)   Resp 18   SpO2 98%  Physical Exam General: resting.  NAD Cardiac: well perfused Lungs: even and unlabored resp Psych: calm  ED Course / MDM  EKG:   I have reviewed the labs performed to date as well as medications administered while in observation.  Recent changes in the last 24 hours include none.  Plan  Current plan is for psych dispo. Patient is under full IVC at this time.   Willy Eddy, MD 03/30/20 [phone]

## 2020-03-30 NOTE — BH Assessment (Signed)
Referral checks:   [facility] ([phone]--[phone]) no answer; (after hours).   [facility] ([phone]---[phone]), Per Tamela Oddi, there are no intake staff available. Advised to refax to access center.    [facility] [phone], [phone], [phone] or [phone]), no answer   [facility] [phone] or [phone]) Joni Reining advised this writer to call back as there is no provider to review referrals at this time.    [facility] [phone]), Under review. Follow up needed.   [facility] [phone] -or- [phone]), Durenda Age denied due to financial.   [facility] [phone]). no answer; mailbox is full   Pt's information refaxed to [facility] after hours access [phone]). Task completed at 12:55 AM.

## 2020-03-30 NOTE — ED Notes (Signed)
Pt given lunch tray.

## 2020-03-30 NOTE — ED Notes (Signed)
VS not taken, patient asleep 

## 2020-03-30 NOTE — ED Notes (Signed)
Report to include Situation, Background, Assessment, and Recommendations received from Amber RN. Patient alert and oriented, warm and dry, in no acute distress. Patient denies SI, HI, AVH and pain. Patient made aware of Q15 minute rounds and security cameras for their safety. Patient instructed to come to me with needs or concerns.  

## 2020-03-30 NOTE — ED Notes (Signed)
Pt awake, breakfast tray served.

## 2020-03-30 NOTE — ED Notes (Signed)
Patient given sprite.

## 2020-03-30 NOTE — ED Notes (Signed)
Pt was given dinner tray and drink 

## 2020-03-31 NOTE — ED Notes (Signed)
Hourly rounding reveals patient in room. No complaints, stable, in no acute distress. Q15 minute rounds and monitoring via Security Cameras to continue. 

## 2020-03-31 NOTE — ED Notes (Signed)
Dr. Toni Amend at bedside,

## 2020-03-31 NOTE — ED Notes (Signed)
Pt given Sprite 

## 2020-03-31 NOTE — ED Notes (Signed)
Pt is irritable and wants to go home.  "I'm tired of sitting here. The doctor needs to hurry up."

## 2020-03-31 NOTE — ED Notes (Signed)
Meal tray given 

## 2020-03-31 NOTE — ED Provider Notes (Signed)
Patient cleared for discharge by psychiatry.  Discharge stable condition.   Gilles Chiquito, MD 03/31/20 [phone]

## 2020-03-31 NOTE — ED Notes (Signed)
   [facility] ([phone]--[phone])no answer; (after hours).  [facility] ([phone]),No staff to provide updates.    [facility] [phone], [phone] or [phone]),no answer

## 2020-03-31 NOTE — ED Notes (Signed)
Pt discharged home. VS stable. All belongings returned to patient. Discharge instructions reviewed with patient.  Pt given work note.  Pt denies SI.

## 2020-03-31 NOTE — ED Notes (Signed)
VS not taken, patient asleep 

## 2020-03-31 NOTE — ED Notes (Signed)
Pt asleep. VS will be taken once awake. 

## 2020-03-31 NOTE — BH Assessment (Signed)
Referral Check:   [facility] ([phone]--[phone])Per Archie Patten not accepting outside referrals at this time    [facility] ([phone]---[phone]) [facility] request re-fax, task completed 11:08am   [facility] [phone], [phone], [phone] or [phone]) Left a voicemail for [facility] [phone] or [phone] answer, left voicemail    [facility] [phone]) [place] denied due to no insurance, can only offer out of pocket pay rate   [facility] [phone] -or- [phone]) Per [place] no beds available today, try tomorrow     [facility] [phone]) No answer, received message of mailbox being full

## 2020-03-31 NOTE — ED Notes (Signed)
Pt allowed to use the phone. 

## 2020-03-31 NOTE — Consult Note (Signed)
[facility] Face-to-Face Psychiatry Consult   Reason for Consult: Follow-up consult for 35 year old man with a history of alcohol abuse and depression who has been in the emergency room over the weekend Referring Physician: Katrinka Blazing Patient Identification: Joshua [name] MRN:  [id number] Principal Diagnosis: MDD (major depressive disorder), recurrent episode, severe (HCC) Diagnosis:  Principal Problem:   MDD (major depressive disorder), recurrent episode, severe (HCC) Active Problems:   Alcohol abuse   Suicide attempt (HCC)   Total Time spent with patient: 30 minutes  Subjective:   Joshua [name] is a 35 y.o. male patient admitted with "I think maybe I can just go".  HPI: Patient seen chart reviewed.  Patient is a gentleman who came to the emergency room prior to the weekend after a suicide attempt at home.  He was intoxicated on presentation.  Today after resting for 2 more days he tells me he is feeling well enough that he thinks he can go home.  I explained to him that we have no beds in our hospital and have referred him out to multiple other facilities.  Patient states he does not have any acute suicidal ideation.  Still feels mildly dysphoric but feels like he can cope with that better.  Minimizes the degree to which his drinking influences his behavior.  Denies homicidal ideation denies psychotic symptoms.  Patient has at least 2 jobs that he mentions and is anxious to get back to them.  He says that he feels certain he is not going to harm himself in the near future and can plan to reach out and get help before it gets that bad again.  Past Psychiatric History: History of depression and alcohol abuse.  Minimal follow-up.  He says he has taken medicines in the past but they never really made him feel any better.  He is strongly convinced that he has ADHD.  Not clear he is ever been treated for it.  Risk to Self:   Risk to Others:   Prior Inpatient Therapy:   Prior Outpatient Therapy:    Past  Medical History:  Past Medical History:  Diagnosis Date  . ADD (attention deficit disorder)   . Depression   . Paranoid [facility])     Past Surgical History:  Procedure Laterality Date  . HERNIA REPAIR     Family History: No family history on file. Family Psychiatric  History: See previous Social History:  Social History   Substance and Sexual Activity  Alcohol Use Yes   Comment: occassional 24oz beer at night     Social History   Substance and Sexual Activity  Drug Use No    Social History   Socioeconomic History  . Marital status: Single    Spouse name: Not on file  . Number of children: Not on file  . Years of education: Not on file  . Highest education level: Not on file  Occupational History  . Not on file  Tobacco Use  . Smoking status: Current Every Day Smoker    Packs/day: 0.50    Types: Cigarettes  . Smokeless tobacco: Never Used  Substance and Sexual Activity  . Alcohol use: Yes    Comment: occassional 24oz beer at night  . Drug use: No  . Sexual activity: Not on file  Other Topics Concern  . Not on file  Social History Narrative  . Not on file   Social Determinants of Health   Financial Resource Strain: Not on file  Food Insecurity: Not on file  Transportation Needs: Not on file  Physical Activity: Not on file  Stress: Not on file  Social Connections: Not on file   Additional Social History:    Allergies:  No Known Allergies  Labs: No results found for this or any previous visit (from the past 48 hour(s)).  Current Facility-Administered Medications  Medication Dose Route Frequency Provider Last Rate Last Admin  . melatonin tablet 2.5 mg  2.5 mg Oral QHS Foye Deer, RPH   2.5 mg at 03/30/20 2121   No current outpatient medications on file.    Musculoskeletal: Strength & Muscle Tone: within normal limits Gait & Station: normal Patient leans: N/A            Psychiatric Specialty Exam:  Presentation  General Appearance:  Bizarre  Eye Contact:Minimal  Speech:Garbled; Slow  Speech Volume:Decreased  Handedness:Right   Mood and Affect  Mood:Hopeless  Affect:Blunt; Congruent; Depressed   Thought Process  Thought Processes:Coherent  Descriptions of Associations:Loose  Orientation:Full (Time, Place and Person)  Thought Content:Logical  History of Schizophrenia/Schizoaffective disorder:No  Duration of Psychotic Symptoms:No data recorded Hallucinations:No data recorded Ideas of Reference:None  Suicidal Thoughts:No data recorded Homicidal Thoughts:No data recorded  Sensorium  Memory:Immediate Good; Recent Good; Remote Good  Judgment:Impaired  Insight:Lacking   Executive Functions  Concentration:Poor  Attention Span:Fair  Recall:Fair  Fund of Knowledge:Fair  Language:Fair   Psychomotor Activity  Psychomotor Activity:No data recorded  Assets  Assets:Communication Skills; Desire for Improvement; Social Support; Resilience   Sleep  Sleep:No data recorded  Physical Exam: Physical Exam Vitals and nursing note reviewed.  Constitutional:      Appearance: Normal appearance.  HENT:     Head: Normocephalic and atraumatic.     Mouth/Throat:     Pharynx: Oropharynx is clear.  Eyes:     Pupils: Pupils are equal, round, and reactive to light.  Cardiovascular:     Rate and Rhythm: Normal rate and regular rhythm.  Pulmonary:     Effort: Pulmonary effort is normal.     Breath sounds: Normal breath sounds.  Abdominal:     General: Abdomen is flat.     Palpations: Abdomen is soft.  Musculoskeletal:        General: Normal range of motion.  Skin:    General: Skin is warm and dry.  Neurological:     General: No focal deficit present.     Mental Status: He is alert. Mental status is at baseline.  Psychiatric:        Mood and Affect: Mood normal.        Thought Content: Thought content normal.    Review of Systems  Constitutional: Negative.   HENT: Negative.   Eyes:  Negative.   Respiratory: Negative.   Cardiovascular: Negative.   Gastrointestinal: Negative.   Musculoskeletal: Negative.   Skin: Negative.   Neurological: Negative.   Psychiatric/Behavioral: Negative for depression, hallucinations, memory loss, substance abuse and suicidal ideas. The patient is nervous/anxious and has insomnia.    Blood pressure 125/82, pulse 74, temperature 98.2 F (36.8 C), temperature source Oral, resp. rate 18, SpO2 97 %. There is no height or weight on file to calculate BMI.  Treatment Plan Summary: Plan Reviewed with patient's of the risks of depression and alcohol abuse.  Strongly encouraged him to avoid drinking again as this obviously makes his mood much worse and increases his risk of self-harm.  He agrees that he will work on that and will follow-up with [facility].  Denies any current suicidal thoughts.  I have discontinued the IVC and reviewed the case with emergency room doctor.  He can be discharged with follow-up at [facility].  Disposition: No evidence of imminent risk to self or others at present.   Patient does not meet criteria for psychiatric inpatient admission. Supportive therapy provided about ongoing stressors. Discussed crisis plan, support from social network, calling 911, coming to the Emergency Department, and calling Suicide Hotline.  Mordecai Rasmussen, MD 03/31/2020 3:57 PM

## 2020-04-01 ENCOUNTER — Other Ambulatory Visit: Payer: Self-pay

## 2020-04-01 ENCOUNTER — Emergency Department
Admission: EM | Admit: 2020-04-01 | Discharge: 2020-04-02 | Disposition: A | Discharge status: Home / Self Care | Payer: Self-pay | Attending: Emergency Medicine | Admitting: Emergency Medicine

## 2020-04-01 DIAGNOSIS — F988 Other specified behavioral and emotional disorders with onset usually occurring in childhood and adolescence: Secondary | ICD-10-CM | POA: Diagnosis present

## 2020-04-01 DIAGNOSIS — F331 Major depressive disorder, recurrent, moderate: Secondary | ICD-10-CM

## 2020-04-01 DIAGNOSIS — F17210 Nicotine dependence, cigarettes, uncomplicated: Secondary | ICD-10-CM | POA: Insufficient documentation

## 2020-04-01 DIAGNOSIS — R45851 Suicidal ideations: Secondary | ICD-10-CM | POA: Insufficient documentation

## 2020-04-01 DIAGNOSIS — F1010 Alcohol abuse, uncomplicated: Secondary | ICD-10-CM | POA: Diagnosis present

## 2020-04-01 DIAGNOSIS — F10920 Alcohol use, unspecified with intoxication, uncomplicated: Secondary | ICD-10-CM

## 2020-04-01 DIAGNOSIS — F10129 Alcohol abuse with intoxication, unspecified: Secondary | ICD-10-CM | POA: Insufficient documentation

## 2020-04-01 DIAGNOSIS — F332 Major depressive disorder, recurrent severe without psychotic features: Secondary | ICD-10-CM | POA: Insufficient documentation

## 2020-04-01 DIAGNOSIS — F341 Dysthymic disorder: Secondary | ICD-10-CM | POA: Diagnosis present

## 2020-04-01 DIAGNOSIS — Z20822 Contact with and (suspected) exposure to covid-19: Secondary | ICD-10-CM | POA: Insufficient documentation

## 2020-04-01 LAB — CBC
HCT: 49.4 % (ref 39.0–52.0)
Hemoglobin: 16.5 g/dL (ref 13.0–17.0)
MCH: 31.1 pg (ref 26.0–34.0)
MCHC: 33.4 g/dL (ref 30.0–36.0)
MCV: 93 fL (ref 80.0–100.0)
Platelets: 242 10*3/uL (ref 150–400)
RBC: 5.31 MIL/uL (ref 4.22–5.81)
RDW: 12.8 % (ref 11.5–15.5)
WBC: 9.9 10*3/uL (ref 4.0–10.5)
nRBC: 0 % (ref 0.0–0.2)

## 2020-04-01 LAB — COMPREHENSIVE METABOLIC PANEL
ALT: 29 U/L (ref 0–44)
AST: 24 U/L (ref 15–41)
Albumin: 4.7 g/dL (ref 3.5–5.0)
Alkaline Phosphatase: 55 U/L (ref 38–126)
Anion gap: 12 (ref 5–15)
BUN: 11 mg/dL (ref 6–20)
CO2: 21 mmol/L — ABNORMAL LOW (ref 22–32)
Calcium: 9.3 mg/dL (ref 8.9–10.3)
Chloride: 103 mmol/L (ref 98–111)
Creatinine, Ser: 0.94 mg/dL (ref 0.61–1.24)
GFR, Estimated: >60 mL/min (ref >60)
Glucose, Bld: 96 mg/dL (ref 70–99)
Potassium: 3.5 mmol/L (ref 3.5–5.1)
Sodium: 136 mmol/L (ref 135–145)
Total Bilirubin: 0.8 mg/dL (ref 0.3–1.2)
Total Protein: 7.5 g/dL (ref 6.5–8.1)

## 2020-04-01 LAB — SALICYLATE LEVEL: Salicylate Lvl: <7 mg/dL — ABNORMAL LOW (ref 7.0–30.0)

## 2020-04-01 LAB — ETHANOL: Alcohol, Ethyl (B): 143 mg/dL — ABNORMAL HIGH (ref <10)

## 2020-04-01 LAB — ACETAMINOPHEN LEVEL: Acetaminophen (Tylenol), Serum: <10 ug/mL — ABNORMAL LOW (ref 10–30)
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
## 2020-04-01 NOTE — ED Notes (Signed)
Black shirt Tan pants White socks Black hoodie Black boots Plaid underwear Cell phone Lighter Cigarettes

## 2020-04-01 NOTE — ED Provider Notes (Signed)
[facility] Emergency Department Provider Note   ____________________________________________   Event Date/Time   First MD Initiated Contact with Patient 04/01/20 2322     (approximate)  I have reviewed the triage vital signs and the nursing notes.   HISTORY  Chief Complaint Mental Health Problem    HPI Joshua [name] is a 35 y.o. male returns to the ED from home requesting behavioral medicine evaluation for depression.  Patient was seen 03/28/2020 after attempting to hang himself and released by psychiatry yesterday.  He returns for continued suicidal thoughts.  Endorses EtOH consumption.  Denies HI/AH/VH.  Voices no medical complaints.     Past Medical History:  Diagnosis Date  . ADD (attention deficit disorder)   . Depression   . Paranoid [facility])     Patient Active Problem List   Diagnosis Date Noted  . MDD (major depressive disorder), recurrent episode, severe (HCC) 03/28/2020  . Alcohol abuse 03/28/2020  . Suicide attempt (HCC) 03/28/2020  . ADD (attention deficit disorder) 10/21/2015  . Dysthymia 10/21/2015    Past Surgical History:  Procedure Laterality Date  . HERNIA REPAIR      Prior to Admission medications   Not on File    Allergies Patient has no known allergies.  History reviewed. No pertinent family history.  Social History Social History   Tobacco Use  . Smoking status: Current Every Day Smoker    Packs/day: 0.50    Types: Cigarettes  . Smokeless tobacco: Never Used  Substance Use Topics  . Alcohol use: Yes    Comment: occassional 24oz beer at night  . Drug use: No    Review of Systems  Constitutional: No fever/chills Eyes: No visual changes. ENT: No sore throat. Cardiovascular: Denies chest pain. Respiratory: Denies shortness of breath. Gastrointestinal: No abdominal pain.  No nausea, no vomiting.  No diarrhea.  No constipation. Genitourinary: Negative for dysuria. Musculoskeletal: Negative for back  pain. Skin: Negative for rash. Neurological: Negative for headaches, focal weakness or numbness. Psychiatric:  Positive for depression with SI.  ____________________________________________   PHYSICAL EXAM:  VITAL SIGNS: ED Triage Vitals [04/01/20 2223]  Enc Vitals Group     BP 123/81     Pulse Rate 84     Resp 18     Temp 98 F (36.7 C)     Temp Source Oral     SpO2 96 %     Weight 168 lb (76.2 kg)     Height 5\' 4"  (1.626 m)     Head Circumference      Peak Flow      Pain Score 0     Pain Loc      Pain Edu?      Excl. in GC?     Constitutional: Alert and oriented. Well appearing and in no acute distress. Eyes: Conjunctivae are normal. PERRL. EOMI. Head: Atraumatic. Nose: No congestion/rhinnorhea. Mouth/Throat: Mucous membranes are moist.   Neck: No stridor.   Cardiovascular: Normal rate, regular rhythm. Grossly normal heart sounds.  Good peripheral circulation. Respiratory: Normal respiratory effort.  No retractions. Lungs CTAB. Gastrointestinal: Soft and nontender. No distention. No abdominal bruits. No CVA tenderness. Musculoskeletal: No lower extremity tenderness nor edema.  No joint effusions. Neurologic:  Normal speech and language. No gross focal neurologic deficits are appreciated. No gait instability. Skin:  Skin is warm, dry and intact. No rash noted. Psychiatric: Mood and affect are flat. Speech and behavior are normal.  ____________________________________________   LABS (all labs ordered  are listed, but only abnormal results are displayed)  Labs Reviewed  COMPREHENSIVE METABOLIC PANEL - Abnormal; Notable for the following components:      Result Value   CO2 21 (*)    All other components within normal limits  ETHANOL - Abnormal; Notable for the following components:   Alcohol, Ethyl (B) 143 (*)    All other components within normal limits  SALICYLATE LEVEL - Abnormal; Notable for the following components:   Salicylate Lvl <7.0 (*)    All other  components within normal limits  ACETAMINOPHEN LEVEL - Abnormal; Notable for the following components:   Acetaminophen (Tylenol), Serum <10 (*)    All other components within normal limits  RESP PANEL BY RT-PCR (FLU A&B, COVID) ARPGX2  CBC  URINE DRUG SCREEN, QUALITATIVE ([facility] ONLY)   ____________________________________________  EKG  None ____________________________________________  RADIOLOGY I, Perri Aragones J, personally viewed and evaluated these images (plain radiographs) as part of my medical decision making, as well as reviewing the written report by the radiologist.  ED MD interpretation: None  Official radiology report(s): No results found.  ____________________________________________   PROCEDURES  Procedure(s) performed (including Critical Care):  Procedures   ____________________________________________   INITIAL IMPRESSION / ASSESSMENT AND PLAN / ED COURSE  As part of my medical decision making, I reviewed the following data within the electronic MEDICAL RECORD NUMBER Nursing notes reviewed and incorporated, Labs reviewed, Old chart reviewed, A consult was requested and obtained from this/these consultant(s) Psychiatry and Notes from prior ED visits     35 year old male returning for continued depression.  Contracts for safety while in the emergency department. The patient has been placed in psychiatric observation due to the need to provide a safe environment for the patient while obtaining psychiatric consultation and evaluation, as well as ongoing medical and medication management to treat the patient's condition.  The patient has not been placed under full IVC at this time.       ____________________________________________   FINAL CLINICAL IMPRESSION(S) / ED DIAGNOSES  Final diagnoses:  Moderate episode of recurrent major depressive disorder (HCC)  Alcoholic intoxication without complication [facility])     ED Discharge Orders    None      *Please  note:  Joshua [name] was evaluated in Emergency Department on 04/02/2020 for the symptoms described in the history of present illness. He was evaluated in the context of the global COVID-19 pandemic, which necessitated consideration that the patient might be at risk for infection with the SARS-CoV-2 virus that causes COVID-19. Institutional protocols and algorithms that pertain to the evaluation of patients at risk for COVID-19 are in a state of rapid change based on information released by regulatory bodies including the CDC and federal and state organizations. These policies and algorithms were followed during the patient's care in the ED.  Some ED evaluations and interventions may be delayed as a result of limited staffing during and the pandemic.*   Note:  This document was prepared using Dragon voice recognition software and may include unintentional dictation errors.   Irean Hong, MD 04/02/20 0700

## 2020-04-01 NOTE — ED Triage Notes (Signed)
Pt presents to ER stating he wants to get checked out for his mental problems.  Pt states he was seen here Friday after attempting to hang himself.  When asked if he still has thoughts of harming himself, pt states "yes and no."  Pt states he wants to go through with treatment this time around. Pt A&O x4 at this time.  No other needs noted.  Pt states he has been drinking since around 1500 today and stopped drinking appx 45 minutes ago.

## 2020-04-02 ENCOUNTER — Encounter: Payer: Self-pay | Admitting: Psychiatry

## 2020-04-02 ENCOUNTER — Inpatient Hospital Stay
Admission: AD | Admit: 2020-04-02 | Discharge: 2020-04-07 | DRG: 885 | Disposition: A | Discharge status: Home / Self Care | Payer: Self-pay | Source: Intra-hospital | Attending: Behavioral Health | Admitting: Behavioral Health

## 2020-04-02 DIAGNOSIS — F332 Major depressive disorder, recurrent severe without psychotic features: Secondary | ICD-10-CM

## 2020-04-02 DIAGNOSIS — Z9151 Personal history of suicidal behavior: Secondary | ICD-10-CM

## 2020-04-02 DIAGNOSIS — Z79899 Other long term (current) drug therapy: Secondary | ICD-10-CM

## 2020-04-02 DIAGNOSIS — F17210 Nicotine dependence, cigarettes, uncomplicated: Secondary | ICD-10-CM | POA: Diagnosis present

## 2020-04-02 DIAGNOSIS — I10 Essential (primary) hypertension: Secondary | ICD-10-CM | POA: Diagnosis present

## 2020-04-02 DIAGNOSIS — F1020 Alcohol dependence, uncomplicated: Secondary | ICD-10-CM | POA: Diagnosis present

## 2020-04-02 DIAGNOSIS — Z20822 Contact with and (suspected) exposure to covid-19: Secondary | ICD-10-CM | POA: Diagnosis present

## 2020-04-02 DIAGNOSIS — F988 Other specified behavioral and emotional disorders with onset usually occurring in childhood and adolescence: Secondary | ICD-10-CM | POA: Diagnosis present

## 2020-04-02 DIAGNOSIS — Y906 Blood alcohol level of 120-199 mg/100 ml: Secondary | ICD-10-CM | POA: Diagnosis present

## 2020-04-02 DIAGNOSIS — R45851 Suicidal ideations: Secondary | ICD-10-CM | POA: Diagnosis present

## 2020-04-02 LAB — RESP PANEL BY RT-PCR (FLU A&B, COVID) ARPGX2
Influenza A by PCR: NEGATIVE
Influenza B by PCR: NEGATIVE
SARS Coronavirus 2 by RT PCR: NEGATIVE

## 2020-04-02 MED ORDER — MAGNESIUM HYDROXIDE 400 MG/5ML PO SUSP: 30.0000 mL | Freq: Every day | ORAL | Status: DC | PRN

## 2020-04-02 MED ORDER — ALUM & MAG HYDROXIDE-SIMETH 200-200-20 MG/5ML PO SUSP: 30.0000 mL | ORAL | Status: DC | PRN

## 2020-04-02 MED ORDER — HYDROXYZINE HCL 50 MG PO TABS
50.0000 mg | ORAL_TABLET | Freq: Three times a day (TID) | ORAL | Status: DC | PRN
  Administered 2020-04-03 – 2020-04-07 (×5): 50 mg via ORAL
  Filled 2020-04-02 (×6): qty 1

## 2020-04-02 MED ORDER — ACETAMINOPHEN 325 MG PO TABS
650.0000 mg | ORAL_TABLET | Freq: Four times a day (QID) | ORAL | Status: DC | PRN
  Administered 2020-04-04: 650 mg via ORAL
  Filled 2020-04-02: qty 2

## 2020-04-02 MED ORDER — TRAZODONE HCL 100 MG PO TABS
100.0000 mg | ORAL_TABLET | Freq: Every evening | ORAL | Status: DC | PRN
  Administered 2020-04-03 – 2020-04-06 (×4): 100 mg via ORAL
  Filled 2020-04-02 (×4): qty 1

## 2020-04-02 NOTE — ED Notes (Signed)
Pt ate all of breakfast tray. 

## 2020-04-02 NOTE — ED Notes (Signed)
Resumed care from ally rn.  Pt sitting in room, alert, calm watching tv.

## 2020-04-02 NOTE — ED Notes (Signed)
Hourly rounding reveals patient in room. No complaints, stable, in no acute distress. Q15 minute rounds and monitoring via Security Cameras to continue. 

## 2020-04-02 NOTE — BH Assessment (Signed)
Patient is to be admitted to [facility] by Dr. Toni Amend.  Attending Physician will be Dr. Neale Burly.   Patient has been assigned to room 322, by [facility] Charge Nurse Marchelle Folks.     ER staff is aware of the admission:  Misty Stanley, ER Secretary    Dr. Larinda Buttery, ER MD   Amy, Patient's Nurse   Clydie Braun, Patient Access.

## 2020-04-02 NOTE — BH Assessment (Signed)
Referral information for Psychiatric Hospitalization faxed to:   [facility] ([phone]-or- [phone]),    [facility] ([phone]),    [facility] ([phone] -or- [phone]),    [facility] (-[phone] -or- [phone])  [phone]   [place] ([phone]---[phone]---[phone]),    [place] ([phone] or [phone])   [facility] ([phone] or [phone])   [place] ([phone] or [phone]),    [place] ([phone]). 

## 2020-04-02 NOTE — ED Notes (Signed)
Report off to [facility]wan rn 

## 2020-04-02 NOTE — ED Notes (Signed)
Pt eating dinner tray °

## 2020-04-02 NOTE — ED Notes (Signed)
Pt awake and eating breakfast.

## 2020-04-02 NOTE — ED Notes (Signed)
Pt. To BHU from ED ambulatory without difficulty, to room  BHU 2. Report from [facility]. Pt. Is alert and oriented, warm and dry in no distress. Pt. Denies SI, HI, and AVH. Pt. Calm and cooperative. Pt. Made aware of security cameras and Q15 minute rounds. Pt. Encouraged to let Nursing staff know of any concerns or needs.

## 2020-04-02 NOTE — ED Notes (Signed)
VOL  PENDING  GOING  TO  [facility]  TONIGHT

## 2020-04-02 NOTE — ED Notes (Addendum)
Resumed care from ally rn.  Pt in room .

## 2020-04-02 NOTE — ED Notes (Signed)
Patient is stable in NAD. He is calm and cooperative. He is transferred to [facility] via NT and officer. Patient belongings given to BMU unit. VOL consent sent to BMU unit via [facility]. Report given to [facility] RN. No issues.

## 2020-04-02 NOTE — BH Assessment (Signed)
Comprehensive Clinical Assessment (CCA) Note  04/02/2020 Joshua [name] [id number] Recommendations for Services/Supports/Treatments: Consulted with Madaline Brilliant., NP, who recommended pt. be observed overnight and reassess in the AM. Notified Dr. Dolores Frame and Cala Bradford, RN of disposition recommendation.    Joshua [name] is a 35 year old patient who presented to [facility] ED, voluntarily for a psych evaluation. Pt presented with slowed psychomotor activity. Pt was drowsy, but thought processes were clear and intact. When asked what brought him to the hospital the pt. stated, "I was just released. I thought I was alright. I called to talk to my aunt and uncle, and they suggested I come back." Pt had fair insight. Pt presented with a depressed mood and a dysphoric affect. Pt identified his stressors as his inability to see his daughter and his grandfather being ill. Pt endorsed symptoms of depression. Pt denied NSSIB, however he reported a hx of cutting. Pt denies suicidal/homicidal ideations and auditory/visual hallucinations. Pt reported having poor sleep and a good appetite. Pt admitted to substance abuse, however pt. minimized his drinking and was unable to identify it as a problem. Pt reported that he currently stays with a friend. When asked why he didn't follow up with [facility] as recommended pt. stated, "I tried to, but they didn't want to help me. They wouldn't listen to me, really." Flowsheet Row ED from 04/01/2020 in [facility] EMERGENCY DEPARTMENT ED from 03/28/2020 in [facility]  C-SSRS RISK CATEGORY High Risk Error: Q3, 4, or 5 should not be populated when Q2 is No    The patient demonstrates the following risk factors for suicide: Chronic risk factors for suicide include: psychiatric disorder of MDD and substance use disorder. Acute risk factors for suicide include: family or marital conflict. Protective factors for this patient include: responsibility  to others (children, family). Considering these factors, the overall suicide risk at this point appears to be moderate. Patient is appropriate for outpatient follow up.  Chief Complaint:  Chief Complaint  Patient presents with  . Mental Health Problem   Visit Diagnosis: Major Depressive Disorder, recurrent, severe without psychosis  Alcohol use disorder, mild    CCA Screening, Triage and Referral (STR)  Patient Reported Information How did you hear about [place]? Self  Referral name: Self  Referral phone number: [phone]   Whom do you see for routine medical problems? I don't have a doctor  Practice/Facility Name: No data recorded Practice/Facility Phone Number: No data recorded Name of Contact: No data recorded Contact Number: No data recorded Contact Fax Number: No data recorded Prescriber Name: No data recorded Prescriber Address (if known): No data recorded  What Is the Reason for Your Visit/Call Today? Psych Eval  How Long Has This Been Causing You Problems? 1 wk - 1 month  What Do You Feel Would Help You the Most Today? Treatment for Depression or other mood problem   Have You Recently Been in Any Inpatient Treatment (Hospital/Detox/Crisis Center/28-Day Program)? No  Name/Location of Program/Hospital:No data recorded How Long Were You There? No data recorded When Were You Discharged? No data recorded  Have You Ever Received Services From [facility] Before? No  Who Do You See at [facility]? No data recorded  Have You Recently Had Any Thoughts About Hurting Yourself? Yes  Are You Planning to Commit Suicide/Harm Yourself At This time? No   Have you Recently Had Thoughts About Hurting Someone Karolee Ohs? No  Explanation: No data recorded  Have You Used Any Alcohol or Drugs  in the Past 24 Hours? Yes  How Long Ago Did You Use Drugs or Alcohol? No data recorded What Did You Use and How Much? Alcohol; unknown amount   Do You Currently Have a  Therapist/Psychiatrist? No  Name of Therapist/Psychiatrist: No data recorded  Have You Been Recently Discharged From Any Office Practice or Programs? No  Explanation of Discharge From Practice/Program: No data recorded    CCA Screening Triage Referral Assessment Type of Contact: Face-to-Face  Is this Initial or Reassessment? No data recorded Date Telepsych consult ordered in [facility]:  No data recorded Time Telepsych consult ordered in [facility]:  No data recorded  Patient Reported Information Reviewed? Yes  Patient Left Without Being Seen? No data recorded Reason for Not Completing Assessment: No data recorded  Collateral Involvement: No data recorded  Does Patient Have a Court Appointed Legal Guardian? No data recorded Name and Contact of Legal Guardian: No data recorded If Minor and Not Living with Parent(s), Who has Custody? n/a  Is CPS involved or ever been involved? Never  Is APS involved or ever been involved? Never   Patient Determined To Be At Risk for Harm To Self or Others Based on Review of Patient Reported Information or Presenting Complaint? No  Method: No data recorded Availability of Means: No data recorded Intent: No data recorded Notification Required: No data recorded Additional Information for Danger to Others Potential: No data recorded Additional Comments for Danger to Others Potential: No data recorded Are There Guns or Other Weapons in Your Home? No data recorded Types of Guns/Weapons: No data recorded Are These Weapons Safely Secured?                            No data recorded Who Could Verify You Are Able To Have These Secured: No data recorded Do You Have any Outstanding Charges, Pending Court Dates, Parole/Probation? No data recorded Contacted To Inform of Risk of Harm To Self or Others: No data recorded  Location of Assessment: [facility]   Does Patient Present under Involuntary Commitment? No  IVC Papers Initial File Date: 03/28/2020   [place] of  Residence: [place]   Patient Currently Receiving the Following Services: Not Receiving Services   Determination of Need: Routine (7 days)   Options For Referral: Other: Comment (Per Annice Pih, T, NP pt is recommended for overnight obs and reassessment in the AM)     CCA Biopsychosocial Intake/Chief Complaint:  Psych Eval  Current Symptoms/Problems: Depression   Patient Reported Schizophrenia/Schizoaffective Diagnosis in Past: No   Strengths: Pt has insight into his problems  Preferences: None noted  Abilities: Able to take care of self   Type of Services Patient Feels are Needed: Treatment   Initial Clinical Notes/Concerns: Pt was just discharge with a recommendation to follow up with [facility]; pt reported that he didn't like [facility] as they wouldn't listen to him.   Mental Health Symptoms Depression:  Hopelessness; Worthlessness; Change in energy/activity   Duration of Depressive symptoms: Greater than two weeks   Mania:  None   Anxiety:   Tension   Psychosis:  None   Duration of Psychotic symptoms: No data recorded  Trauma:  None   Obsessions:  None   Compulsions:  None   Inattention:  N/A; None   Hyperactivity/Impulsivity:  N/A   Oppositional/Defiant Behaviors:  None   Emotional Irregularity:  Potentially harmful impulsivity   Other Mood/Personality Symptoms:  No data recorded   Mental Status  Exam Appearance and self-care  Stature:  Average   Weight:  Average weight   Clothing:  -- (n/a; pt in hospital scrubs)   Grooming:  Neglected   Cosmetic use:  None   Posture/gait:  -- (n/a)   Motor activity:  Not Remarkable   Sensorium  Attention:  Normal   Concentration:  Normal   Orientation:  X5   Recall/memory:  Normal   Affect and Mood  Affect:  Depressed   Mood:  Dysphoric   Relating  Eye contact:  Normal   Facial expression:  Sad   Attitude toward examiner:  Cooperative   Thought and Language  Speech flow: Clear and Coherent    Thought content:  Appropriate to Mood and Circumstances   Preoccupation:  None   Hallucinations:  None   Organization:  No data recorded  Aff[employer] Computer ServicesExecutive Functions  Fund of Knowledge:  Average   Intelligence:  Average   Abstraction:  Normal   Judgement:  Impaired   Reality Testing:  Adequate   Insight:  Good   Decision Making:  Vacilates   Social Functioning  Social Maturity:  Impulsive   Social Judgement:  Normal   Stress  Stressors:  Family conflict   Coping Ability:  Deficient supports; Overwhelmed   Skill Deficits:  Decision making   Supports:  Support needed     Religion: Religion/Spirituality How Might This Affect Treatment?:  (UTA)  Leisure/Recreation: Leisure / Recreation Do You Have Hobbies?:  (N/A)  Exercise/Diet: Exercise/Diet Do You Exercise?:  (N/A) Have You Gained or Lost A Significant Amount of Weight in the Past Six Months?: No Do You Follow a Special Diet?: No Do You Have Any Trouble Sleeping?: No   CCA Employment/Education Employment/Work Situation: Employment / Work Situation Employment situation: Employed Where is patient currently employed?: [employer] and at [employer] Has patient ever been in the [employer] and Companymilitary?: No  Education: Education Is Patient Currently Attending School?: No   CCA Family/Childhood History Family and Relationship History: Family history Are you sexually active?:  (N/A) Does patient have children?: Yes How many children?: 1 How is patient's relationship with their children?: Estranged; pt reports issues with seeing daughter  Childhood History:  Childhood History By whom was/is the patient raised?: Both parents Patient's description of current relationship with people who raised him/her: Pt reports having a distant relationship with his parents Did patient suffer any verbal/emotional/physical/sexual abuse as a child?: No Did patient suffer from severe childhood neglect?: No Has patient ever been  sexually abused/assaulted/raped as an adolescent or adult?: No Was the patient ever a victim of a crime or a disaster?: No Witnessed domestic violence?: No Has patient been affected by domestic violence as an adult?: No  Child/Adolescent Assessment:     CCA Substance Use Alcohol/Drug Use: Alcohol / Drug Use Pain Medications: See PTA Prescriptions: See PTA Over the Counter: See PTA History of alcohol / drug use?: Yes Longest period of sobriety (when/how long): Unable to quantify Negative Consequences of Use: Personal relationships Withdrawal Symptoms:  (Reports none) Substance #1 Name of Substance 1: Alcohol 1 - Frequency: Daily 1 - Last Use / Amount: 04/01/20                       ASAM's:  Six Dimensions of Multidimensional Assessment  Dimension 1:  Acute Intoxication and/or Withdrawal Potential:   Dimension 1:  Description of individual's past and current experiences of substance use and withdrawal: Pt denies withdrawla sx  Dimension 2:  Biomedical Conditions and Complications:   Dimension 2:  Description of patient's biomedical conditions and  complications: None noted  Dimension 3:  Emotional, Behavioral, or Cognitive Conditions and Complications:  Dimension 3:  Description of emotional, behavioral, or cognitive conditions and complications: Pt has a hx of MMD  Dimension 4:  Readiness to Change:  Dimension 4:  Description of Readiness to Change criteria: Pt minimized drinking  Dimension 5:  Relapse, Continued use, or Continued Problem Potential:  Dimension 5:  Relapse, continued use, or continued problem potential critiera description: Pt unable to recognize problematic nature of drinking  Dimension 6:  Recovery/Living Environment:  Dimension 6:  Recovery/Iiving environment criteria description: 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 Severity Score: ASAM's Severity Rating Score: 8  ASAM Recommended Level of Treatment: ASAM Recommended Level of Treatment:  Level I Outpatient Treatment   Substance use Disorder (SUD) Substance Use Disorder (SUD)  Checklist Symptoms of Substance Use: Continued use despite persistent or recurrent social, interpersonal problems, caused or exacerbated by use,Continued use despite having a persistent/recurrent physical/psychological problem caused/exacerbated by use,Presence of craving or strong urge to use  Recommendations for Services/Supports/Treatments: Recommendations for Services/Supports/Treatments Recommendations For Services/Supports/Treatments: Other (Comment),SAIOP (Substance Abuse Intensive Outpatient Program)  DSM5 Diagnoses: Patient Active Problem List   Diagnosis Date Noted  . MDD (major depressive disorder), recurrent episode, severe (HCC) 03/28/2020  . Alcohol abuse 03/28/2020  . Suicide attempt (HCC) 03/28/2020  . ADD (attention deficit disorder) 10/21/2015  . Dysthymia 10/21/2015    Vung Kush R Morgon Pamer, LCAS

## 2020-04-02 NOTE — ED Notes (Signed)
Pt given lunch. Denies further needs currently. Pleasant, cooperative.

## 2020-04-02 NOTE — ED Notes (Addendum)
Pt had phone for call to [employer] approx 7 minutes.

## 2020-04-02 NOTE — ED Notes (Signed)
This Clinical research associate called [facility] to give report and BMU nurse said they getting ready to get report for another ED patient and will get the patient in a few hours. No issues.

## 2020-04-02 NOTE — Consult Note (Signed)
[facility] Face-to-Face Psychiatry Consult   Reason for Consult: Psychiatric evaluation Referring Physician: Dr. Dolores Frame Patient Identification: Joshua [name] MRN:  [id number] Principal Diagnosis: <principal problem not specified> Diagnosis:  Active Problems:   ADD (attention deficit disorder)   Dysthymia   MDD (major depressive disorder), recurrent episode, severe (HCC)   Alcohol abuse   Suicide attempt (HCC)   Total Time spent with patient: 30 minutes  Subjective: " I need some help." Joshua [name] is a 35 y.o. male patient presented to [facility] ED via POV voluntarily.  The patient is seen today or same presentation.  He disclosed that he needs mental health help.  The patient was seen and referred out to seek care.  The patient did not follow through with treatment.  The patient was seen face-to-face by this provider; chart reviewed and consulted with Dr. Dolores Frame on 04/02/2020 due to the care of the patient. It was discussed with the EDP the patient remained under observation overnight and will be reassessed in the a.m. to determine if he meets the criteria for psychiatric inpatient admission; he could be discharged back home.  On evaluation the patient is alert and oriented x 4, calm and cooperative, and mood-congruent with affect. The patient does not appear to be responding to internal or external stimuli. Neither is the patient presenting with any delusional thinking. The patient denies auditory or visual hallucinations. The patient denies any suicidal, homicidal, or self-harm ideations. The patient is not presenting with any psychotic or paranoid behaviors. During an encounter with the patient, he/she was able to answer questions appropriately. Make sure information about collaborative is in the notes Ex. Collateral was obtained by mother who expresses concerns for patient's suicidal thoughts.    HPI: Per Dr. Dolores Frame,   Past Psychiatric History:   Risk to Self:   Risk to Others:   Prior Inpatient  Therapy:   Prior Outpatient Therapy:    Past Medical History:  Past Medical History:  Diagnosis Date  . ADD (attention deficit disorder)   . Depression   . Paranoid [facility])     Past Surgical History:  Procedure Laterality Date  . HERNIA REPAIR     Family History: History reviewed. No pertinent family history. Family Psychiatric  History:  Social History:  Social History   Substance and Sexual Activity  Alcohol Use Yes   Comment: occassional 24oz beer at night     Social History   Substance and Sexual Activity  Drug Use No    Social History   Socioeconomic History  . Marital status: Single    Spouse name: Not on file  . Number of children: Not on file  . Years of education: Not on file  . Highest education level: Not on file  Occupational History  . Not on file  Tobacco Use  . Smoking status: Current Every Day Smoker    Packs/day: 0.50    Types: Cigarettes  . Smokeless tobacco: Never Used  Substance and Sexual Activity  . Alcohol use: Yes    Comment: occassional 24oz beer at night  . Drug use: No  . Sexual activity: Not on file  Other Topics Concern  . Not on file  Social History Narrative  . Not on file   Social Determinants of Health   Financial Resource Strain: Not on file  Food Insecurity: Not on file  Transportation Needs: Not on file  Physical Activity: Not on file  Stress: Not on file  Social Connections: Not on file   Additional  Social History:    Allergies:  No Known Allergies  Labs:  Results for orders placed or performed during the hospital encounter of 04/01/20 (from the past 48 hour(s))  Comprehensive metabolic panel     Status: Abnormal   Collection Time: 04/01/20 10:28 PM  Result Value Ref Range   Sodium 136 135 - 145 mmol/L   Potassium 3.5 3.5 - 5.1 mmol/L   Chloride 103 98 - 111 mmol/L   CO2 21 (L) 22 - 32 mmol/L   Glucose, Bld 96 70 - 99 mg/dL    Comment: Glucose reference range applies only to samples taken after fasting for  at least 8 hours.   BUN 11 6 - 20 mg/dL   Creatinine, Ser 6.23 0.61 - 1.24 mg/dL   Calcium 9.3 8.9 - 76.2 mg/dL   Total Protein 7.5 6.5 - 8.1 g/dL   Albumin 4.7 3.5 - 5.0 g/dL   AST 24 15 - 41 U/L   ALT 29 0 - 44 U/L   Alkaline Phosphatase 55 38 - 126 U/L   Total Bilirubin 0.8 0.3 - 1.2 mg/dL   GFR, Estimated >83 >15 mL/min    Comment: (NOTE) Calculated using the CKD-EPI Creatinine Equation (2021)    Anion gap 12 5 - 15    Comment: Performed at [facility], [address] [place], [place] [address]  Ethanol     Status: Abnormal   Collection Time: 04/01/20 10:28 PM  Result Value Ref Range   Alcohol, Ethyl (B) 143 (H) <10 mg/dL    Comment: (NOTE) Lowest detectable limit for serum alcohol is 10 mg/dL.  For medical purposes only. Performed at [facility], [address] [place], [place] [address]   Salicylate level     Status: Abnormal   Collection Time: 04/01/20 10:28 PM  Result Value Ref Range   Salicylate Lvl <7.0 (L) 7.0 - 30.0 mg/dL    Comment: Performed at [facility], [address] [place], [place] [address]  Acetaminophen level     Status: Abnormal   Collection Time: 04/01/20 10:28 PM  Result Value Ref Range   Acetaminophen (Tylenol), Serum <10 (L) 10 - 30 ug/mL    Comment: (NOTE) Therapeutic concentrations vary significantly. A range of 10-30 ug/mL  may be an effective concentration for many patients. However, some  are best treated at concentrations outside of this range. Acetaminophen concentrations >150 ug/mL at 4 hours after ingestion  and >50 ug/mL at 12 hours after ingestion are often associated with  toxic reactions.  Performed at [facility], [address] [place], [place] [address]   cbc     Status: None   Collection Time: 04/01/20 10:28 PM  Result Value Ref Range   WBC 9.9 4.0 - 10.5 K/uL   RBC 5.31 4.22 - 5.81 MIL/uL   Hemoglobin 16.5 13.0 - 17.0 g/dL   HCT 27.0 35.0 - 09.3 %   MCV 93.0 80.0 -  100.0 fL   MCH 31.1 26.0 - 34.0 pg   MCHC 33.4 30.0 - 36.0 g/dL   RDW 81.8 29.9 - 37.1 %   Platelets 242 150 - 400 K/uL   nRBC 0.0 0.0 - 0.2 %    Comment: Performed at [facility], [address] [place], [place] [address]  Resp Panel by RT-PCR (Flu A&B, Covid) Nasopharyngeal Swab     Status: None   Collection Time: 04/02/20 12:02 AM   Specimen: Nasopharyngeal Swab; Nasopharyngeal(NP) swabs in vial transport medium  Result Value Ref Range   SARS Coronavirus 2 by RT PCR NEGATIVE NEGATIVE    Comment: (NOTE) SARS-CoV-2 target nucleic acids are NOT DETECTED.  The SARS-CoV-2 RNA is generally detectable in upper respiratory specimens during the acute phase of infection. The lowest concentration of SARS-CoV-2 viral copies this assay can detect is 138 copies/mL. A negative result does not preclude SARS-Cov-2 infection and should not be used as the sole basis for treatment or other patient management decisions. A negative result may occur with  improper specimen collection/handling, submission of specimen other than nasopharyngeal swab, presence of viral mutation(s) within the areas targeted by this assay, and inadequate number of viral copies(<138 copies/mL). A negative result must be combined with clinical observations, patient history, and epidemiological information. The expected result is Negative.  Fact Sheet for Patients:  BloggerCourse.com  Fact Sheet for Healthcare Providers:  SeriousBroker.it  This test is no t yet approved or cleared by the [place] FDA and  has been authorized for detection and/or diagnosis of SARS-CoV-2 by FDA under an Emergency Use Authorization (EUA). This EUA will remain  in effect (meaning this test can be used) for the duration of the COVID-19 declaration under Section 564(b)(1) of the Act, 21 U.S.C.section 360bbb-3(b)(1), unless the authorization is terminated  or revoked sooner.        Influenza A by PCR NEGATIVE NEGATIVE   Influenza B by PCR NEGATIVE NEGATIVE    Comment: (NOTE) The Xpert Xpress SARS-CoV-2/FLU/RSV plus assay is intended as an aid in the diagnosis of influenza from Nasopharyngeal swab specimens and should not be used as a sole basis for treatment. Nasal washings and aspirates are unacceptable for Xpert Xpress SARS-CoV-2/FLU/RSV testing.  Fact Sheet for Patients: BloggerCourse.com  Fact Sheet for Healthcare Providers: SeriousBroker.it  This test is not yet approved or cleared by the [place] FDA and has been authorized for detection and/or diagnosis of SARS-CoV-2 by FDA under an Emergency Use Authorization (EUA). This EUA will remain in effect (meaning this test can be used) for the duration of the COVID-19 declaration under Section 564(b)(1) of the Act, 21 U.S.C. section 360bbb-3(b)(1), unless the authorization is terminated or revoked.  Performed at [facility], [address] [place], [place] [address]     No current facility-administered medications for this encounter.   No current outpatient medications on file.    Musculoskeletal: Strength & Muscle Tone: within normal limits Gait & Station: normal Patient leans: N/A Psychiatric Specialty Exam:  Presentation  General Appearance: Appropriate for Environment  Eye Contact:Good  Speech:Clear and Coherent  Speech Volume:Decreased  Handedness:Right   Mood and Affect  Mood:Hopeless  Affect:Blunt; Congruent   Thought Process  Thought Processes:Coherent  Descriptions of Associations:Loose  Orientation:Full (Time, Place and Person)  Thought Content:Illogical  History of Schizophrenia/Schizoaffective disorder:No  Duration of Psychotic Symptoms:No data recorded Hallucinations:Hallucinations: None  Ideas of Reference:None  Suicidal Thoughts:Suicidal Thoughts: Yes, Passive SI Passive Intent  and/or Plan: With Intent; Without Intent  Homicidal Thoughts:Homicidal Thoughts: No   Sensorium  Memory:Immediate Good; Recent Good; Remote Good  Judgment:Impaired  Insight:Lacking   Executive Functions  Concentration:Poor  Attention Span:Fair  Recall:Fair  Fund of Knowledge:Fair  Language:Fair   Psychomotor Activity  Psychomotor Activity:Psychomotor Activity: Normal   Assets  Assets:Communication Skills; Desire for Improvement; Housing   Sleep  Sleep:Sleep: Fair   Physical Exam: Physical Exam HENT:     Mouth/Throat:     Mouth: Mucous membranes are moist.  Cardiovascular:     Rate and Rhythm: Normal  rate.     Pulses: Normal pulses.  Pulmonary:     Effort: Pulmonary effort is normal.  Musculoskeletal:        General: Normal range of motion.     Cervical back: Normal range of motion and neck supple.  Neurological:     General: No focal deficit present.     Mental Status: He is alert and oriented to person, place, and time. Mental status is at baseline.  Psychiatric:        Attention and Perception: Attention and perception normal.        Mood and Affect: Mood is depressed.        Cognition and Memory: Cognition normal.    ROS Blood pressure 123/81, pulse 84, temperature 98 F (36.7 C), temperature source Oral, resp. rate 18, height 5\' 4"  (1.626 m), weight 76.2 kg, SpO2 96 %. Body mass index is 28.84 kg/m.  Treatment Plan Summary: Daily contact with patient to assess and evaluate symptoms and progress in treatment and Plan The patient remained under observation overnight and will be reassessed in the a.m. to determine if he meets the criteria for psychiatric inpatient admission; he could be discharged back home.  Disposition: No evidence of imminent risk to self or others at present.   Supportive therapy provided about ongoing stressors. Discussed crisis plan, support from social network, calling 911, coming to the Emergency Department, and calling  Suicide Hotline. The patient remained under observation overnight and will be reassessed in the a.m. to determine if he meets the criteria for psychiatric inpatient admission; he could be discharged back home.  Gillermo MurdochJacqueline Diksha Tagliaferro, NP 04/02/2020 6:08 AM

## 2020-04-02 NOTE — ED Notes (Signed)
Report to include Situation, Background, Assessment, and Recommendations received from Amy RN. Patient alert and oriented, warm and dry, in no acute distress. Patient reported SI. Denied HI, AVH and pain. Patient made aware of Q15 minute rounds and security cameras for their safety. Patient instructed to come to me with needs or concerns.

## 2020-04-02 NOTE — ED Notes (Signed)
Pt in room lying on bed, calm and cooperative.

## 2020-04-02 NOTE — Consult Note (Signed)
[facility] Face-to-Face Psychiatry Consult   Reason for Consult: Follow-up note for this 35 year old man who returned to the emergency room with alcohol abuse depression and suicidal ideation Referring Physician: Siadecki Patient Identification: Joshua [name] MRN:  [id number] Principal Diagnosis: MDD (major depressive disorder), recurrent episode, severe (HCC) Diagnosis:  Principal Problem:   MDD (major depressive disorder), recurrent episode, severe (HCC) Active Problems:   ADD (attention deficit disorder)   Dysthymia   Alcohol abuse   Total Time spent with patient: 30 minutes  Subjective:   Joshua [name] is a 35 y.o. male patient admitted with "I decided to come back and get help".  HPI: Patient seen chart reviewed.  Patient was just discharged from the emergency room 2 days ago.  He went out and started drinking again.  Had a conversation apparently with his uncle who encouraged him to come back to the hospital for help.  Patient says his mood continues to be sad down negative and depressed and that he occasionally has suicidal thoughts.  Has not made any new attempts since his hanging last week but has had intermittent thoughts of self-harm.  Continues to drink daily.  Continues to feel hopeless about his situation.  Did not apparently do anything to attempt to follow-up with outpatient treatment.  Past Psychiatric History: Past history of depression and alcohol abuse suicide attempt  Risk to Self:   Risk to Others:   Prior Inpatient Therapy:   Prior Outpatient Therapy:    Past Medical History:  Past Medical History:  Diagnosis Date  . ADD (attention deficit disorder)   . Depression   . Paranoid [facility])     Past Surgical History:  Procedure Laterality Date  . HERNIA REPAIR     Family History: History reviewed. No pertinent family history. Family Psychiatric  History: See previous Social History:  Social History   Substance and Sexual Activity  Alcohol Use Yes   Comment:  occassional 24oz beer at night     Social History   Substance and Sexual Activity  Drug Use No    Social History   Socioeconomic History  . Marital status: Single    Spouse name: Not on file  . Number of children: Not on file  . Years of education: Not on file  . Highest education level: Not on file  Occupational History  . Not on file  Tobacco Use  . Smoking status: Current Every Day Smoker    Packs/day: 0.50    Types: Cigarettes  . Smokeless tobacco: Never Used  Substance and Sexual Activity  . Alcohol use: Yes    Comment: occassional 24oz beer at night  . Drug use: No  . Sexual activity: Not on file  Other Topics Concern  . Not on file  Social History Narrative  . Not on file   Social Determinants of Health   Financial Resource Strain: Not on file  Food Insecurity: Not on file  Transportation Needs: Not on file  Physical Activity: Not on file  Stress: Not on file  Social Connections: Not on file   Additional Social History:    Allergies:  No Known Allergies  Labs:  Results for orders placed or performed during the hospital encounter of 04/01/20 (from the past 48 hour(s))  Comprehensive metabolic panel     Status: Abnormal   Collection Time: 04/01/20 10:28 PM  Result Value Ref Range   Sodium 136 135 - 145 mmol/L   Potassium 3.5 3.5 - 5.1 mmol/L   Chloride 103 98 -  111 mmol/L   CO2 21 (L) 22 - 32 mmol/L   Glucose, Bld 96 70 - 99 mg/dL    Comment: Glucose reference range applies only to samples taken after fasting for at least 8 hours.   BUN 11 6 - 20 mg/dL   Creatinine, Ser 3.15 0.61 - 1.24 mg/dL   Calcium 9.3 8.9 - 17.6 mg/dL   Total Protein 7.5 6.5 - 8.1 g/dL   Albumin 4.7 3.5 - 5.0 g/dL   AST 24 15 - 41 U/L   ALT 29 0 - 44 U/L   Alkaline Phosphatase 55 38 - 126 U/L   Total Bilirubin 0.8 0.3 - 1.2 mg/dL   GFR, Estimated >16 >07 mL/min    Comment: (NOTE) Calculated using the CKD-EPI Creatinine Equation (2021)    Anion gap 12 5 - 15    Comment:  Performed at [facility], [address] [place], [place] [address]  Ethanol     Status: Abnormal   Collection Time: 04/01/20 10:28 PM  Result Value Ref Range   Alcohol, Ethyl (B) 143 (H) <10 mg/dL    Comment: (NOTE) Lowest detectable limit for serum alcohol is 10 mg/dL.  For medical purposes only. Performed at [facility], [address] [place], [place] [address]   Salicylate level     Status: Abnormal   Collection Time: 04/01/20 10:28 PM  Result Value Ref Range   Salicylate Lvl <7.0 (L) 7.0 - 30.0 mg/dL    Comment: Performed at [facility], [address] [place], [place] [address]  Acetaminophen level     Status: Abnormal   Collection Time: 04/01/20 10:28 PM  Result Value Ref Range   Acetaminophen (Tylenol), Serum <10 (L) 10 - 30 ug/mL    Comment: (NOTE) Therapeutic concentrations vary significantly. A range of 10-30 ug/mL  may be an effective concentration for many patients. However, some  are best treated at concentrations outside of this range. Acetaminophen concentrations >150 ug/mL at 4 hours after ingestion  and >50 ug/mL at 12 hours after ingestion are often associated with  toxic reactions.  Performed at [facility], [address] [place], [place] [address]   cbc     Status: None   Collection Time: 04/01/20 10:28 PM  Result Value Ref Range   WBC 9.9 4.0 - 10.5 K/uL   RBC 5.31 4.22 - 5.81 MIL/uL   Hemoglobin 16.5 13.0 - 17.0 g/dL   HCT 93.8 18.2 - 99.3 %   MCV 93.0 80.0 - 100.0 fL   MCH 31.1 26.0 - 34.0 pg   MCHC 33.4 30.0 - 36.0 g/dL   RDW 71.6 96.7 - 89.3 %   Platelets 242 150 - 400 K/uL   nRBC 0.0 0.0 - 0.2 %    Comment: Performed at [facility], [address], [place], [place] [address]  Resp Panel by RT-PCR (Flu A&B, Covid) Nasopharyngeal Swab     Status: None   Collection Time: 04/02/20 12:02 AM   Specimen: Nasopharyngeal Swab; Nasopharyngeal(NP) swabs in vial transport medium  Result Value  Ref Range   SARS Coronavirus 2 by RT PCR NEGATIVE NEGATIVE    Comment: (NOTE) SARS-CoV-2 target nucleic acids are NOT DETECTED.  The SARS-CoV-2 RNA is generally detectable in upper respiratory specimens during the acute phase of infection. The lowest concentration of SARS-CoV-2 viral copies this assay can detect is 138 copies/mL. A negative result does not preclude SARS-Cov-2 infection and should not be used as the sole basis  for treatment or other patient management decisions. A negative result may occur with  improper specimen collection/handling, submission of specimen other than nasopharyngeal swab, presence of viral mutation(s) within the areas targeted by this assay, and inadequate number of viral copies(<138 copies/mL). A negative result must be combined with clinical observations, patient history, and epidemiological information. The expected result is Negative.  Fact Sheet for Patients:  BloggerCourse.comhttps://www.fda.gov/media/152166/download  Fact Sheet for Healthcare Providers:  SeriousBroker.ithttps://www.fda.gov/media/152162/download  This test is no t yet approved or cleared by the [place] FDA and  has been authorized for detection and/or diagnosis of SARS-CoV-2 by FDA under an Emergency Use Authorization (EUA). This EUA will remain  in effect (meaning this test can be used) for the duration of the COVID-19 declaration under Section 564(b)(1) of the Act, 21 U.S.C.section 360bbb-3(b)(1), unless the authorization is terminated  or revoked sooner.       Influenza A by PCR NEGATIVE NEGATIVE   Influenza B by PCR NEGATIVE NEGATIVE    Comment: (NOTE) The Xpert Xpress SARS-CoV-2/FLU/RSV plus assay is intended as an aid in the diagnosis of influenza from Nasopharyngeal swab specimens and should not be used as a sole basis for treatment. Nasal washings and aspirates are unacceptable for Xpert Xpress SARS-CoV-2/FLU/RSV testing.  Fact Sheet for  Patients: BloggerCourse.comhttps://www.fda.gov/media/152166/download  Fact Sheet for Healthcare Providers: SeriousBroker.ithttps://www.fda.gov/media/152162/download  This test is not yet approved or cleared by the [place] FDA and has been authorized for detection and/or diagnosis of SARS-CoV-2 by FDA under an Emergency Use Authorization (EUA). This EUA will remain in effect (meaning this test can be used) for the duration of the COVID-19 declaration under Section 564(b)(1) of the Act, 21 U.S.C. section 360bbb-3(b)(1), unless the authorization is terminated or revoked.  Performed at [facility], [address] [place], [place] [address]     No current facility-administered medications for this encounter.   No current outpatient medications on file.    Musculoskeletal: Strength & Muscle Tone: within normal limits Gait & Station: normal Patient leans: N/A            Psychiatric Specialty Exam:  Presentation  General Appearance: Appropriate for Environment  Eye Contact:Good  Speech:Clear and Coherent  Speech Volume:Decreased  Handedness:Right   Mood and Affect  Mood:Hopeless  Affect:Blunt; Congruent   Thought Process  Thought Processes:Coherent  Descriptions of Associations:Loose  Orientation:Full (Time, Place and Person)  Thought Content:Illogical  History of Schizophrenia/Schizoaffective disorder:No  Duration of Psychotic Symptoms:No data recorded Hallucinations:Hallucinations: None  Ideas of Reference:None  Suicidal Thoughts:Suicidal Thoughts: Yes, Passive SI Passive Intent and/or Plan: With Intent; Without Intent  Homicidal Thoughts:Homicidal Thoughts: No   Sensorium  Memory:Immediate Good; Recent Good; Remote Good  Judgment:Impaired  Insight:Lacking   Executive Functions  Concentration:Poor  Attention Span:Fair  Recall:Fair  Fund of Knowledge:Fair  Language:Fair   Psychomotor Activity  Psychomotor Activity:Psychomotor Activity:  Normal   Assets  Assets:Communication Skills; Desire for Improvement; Housing   Sleep  Sleep:Sleep: Fair   Physical Exam: Physical Exam Constitutional:      Appearance: Normal appearance.  HENT:     Head: Normocephalic and atraumatic.     Mouth/Throat:     Pharynx: Oropharynx is clear.  Eyes:     Pupils: Pupils are equal, round, and reactive to light.  Cardiovascular:     Rate and Rhythm: Normal rate and regular rhythm.  Pulmonary:     Effort: Pulmonary effort is normal.     Breath sounds: Normal breath sounds.  Abdominal:     General:  Abdomen is flat.     Palpations: Abdomen is soft.  Musculoskeletal:        General: Normal range of motion.  Skin:    General: Skin is warm and dry.  Neurological:     General: No focal deficit present.     Mental Status: He is alert. Mental status is at baseline.  Psychiatric:        Attention and Perception: He is inattentive.        Mood and Affect: Mood normal. Affect is blunt.        Speech: Speech is delayed.        Behavior: Behavior is slowed.        Thought Content: Thought content includes suicidal ideation. Thought content does not include suicidal plan.        Cognition and Memory: Cognition is impaired. Memory is impaired.        Judgment: Judgment is impulsive.    Review of Systems  Constitutional: Negative.   HENT: Negative.   Eyes: Negative.   Respiratory: Negative.   Cardiovascular: Negative.   Gastrointestinal: Negative.   Musculoskeletal: Negative.   Skin: Negative.   Neurological: Negative.   Psychiatric/Behavioral: Positive for depression, substance abuse and suicidal ideas.   Blood pressure (!) 135/91, pulse 66, temperature 98.2 F (36.8 C), temperature source Oral, resp. rate 18, height 5\' 4"  (1.626 m), weight 76.2 kg, SpO2 98 %. Body mass index is 28.84 kg/m.  Treatment Plan Summary: Medication management and Plan 35 year old man with depression and alcohol abuse returns voicing suicidal ideation.   Not currently looking like he is delirious or having active withdrawal.  Requesting "help".  Continue with plan to refer him to inpatient psychiatric beds as we have none available here.  Reassess daily.  Disposition: Recommend psychiatric Inpatient admission when medically cleared.  34, MD 04/02/2020 3:41 PM

## 2020-04-03 ENCOUNTER — Other Ambulatory Visit: Payer: Self-pay

## 2020-04-03 DIAGNOSIS — F332 Major depressive disorder, recurrent severe without psychotic features: Principal | ICD-10-CM

## 2020-04-03 DIAGNOSIS — F1020 Alcohol dependence, uncomplicated: Secondary | ICD-10-CM | POA: Diagnosis present

## 2020-04-03 LAB — URINALYSIS, ROUTINE W REFLEX MICROSCOPIC
Bilirubin Urine: NEGATIVE
Glucose, UA: NEGATIVE mg/dL
Hgb urine dipstick: NEGATIVE
Ketones, ur: NEGATIVE mg/dL
Leukocytes,Ua: NEGATIVE
Nitrite: NEGATIVE
Protein, ur: NEGATIVE mg/dL
Specific Gravity, Urine: 1.021 (ref 1.005–1.030)
pH: 6 (ref 5.0–8.0)

## 2020-04-03 MED ORDER — BUPROPION HCL ER (XL) 150 MG PO TB24
150.0000 mg | ORAL_TABLET | Freq: Every day | ORAL | Status: DC
  Administered 2020-04-03 – 2020-04-07 (×5): 150 mg via ORAL
  Filled 2020-04-03 (×5): qty 1

## 2020-04-03 MED ORDER — THIAMINE HCL 100 MG PO TABS
100.0000 mg | ORAL_TABLET | Freq: Every day | ORAL | Status: DC
  Administered 2020-04-03 – 2020-04-07 (×5): 100 mg via ORAL
  Filled 2020-04-03 (×5): qty 1

## 2020-04-03 MED ORDER — NALTREXONE HCL 50 MG PO TABS
50.0000 mg | ORAL_TABLET | Freq: Every day | ORAL | Status: DC
  Administered 2020-04-03 – 2020-04-07 (×5): 50 mg via ORAL
  Filled 2020-04-03 (×5): qty 1

## 2020-04-03 MED ORDER — ADULT MULTIVITAMIN W/MINERALS CH
1.0000 | ORAL_TABLET | Freq: Every day | ORAL | Status: DC
  Administered 2020-04-03 – 2020-04-07 (×5): 1 via ORAL
  Filled 2020-04-03 (×5): qty 1

## 2020-04-03 MED ORDER — NICOTINE POLACRILEX 2 MG MT GUM: 2.0000 mg | CHEWING_GUM | OROMUCOSAL | Status: DC | PRN

## 2020-04-03 MED ORDER — TRAZODONE HCL 50 MG PO TABS: 50.0000 mg | ORAL_TABLET | Freq: Every evening | ORAL | Status: DC | PRN

## 2020-04-03 NOTE — Progress Notes (Signed)
Patient admitted from [facility], report recieved from [place], [place]. Patient calm and cooperative during assessment. Pt endorses passive SI but verbally contracts for safety with staff. Patient denies HI/AVH. Patient stated he came in for wanting to hurt himself and for drinking to much alcohol. Pt stated his goal is to go to long term treatment for alcohol abuse. Patient skin check completed with Cleo, pt has a small scab on right knee. No contraband found on patient. Patient oriented to the unit and his room. Pt given education, support, and encouragement to be active in his treatment plan. Patient being monitored Q 15 minutes for safety per unit protocol. Pt remains safe on the unit.

## 2020-04-03 NOTE — BHH Counselor (Signed)
Adult Comprehensive Assessment  Patient ID: Joshua [name], male   DOB: 08-31-1985, 35 y.o.   MRN: [id number]  Information Source: Information source: Patient  Current Stressors:  Patient states their primary concerns and needs for treatment are:: "Friday morning around one or two o'clock in the morning I tried to hang myself...not being able to see my daughter, grandfather is sick." Patient states their goals for this hospitilization and ongoing recovery are:: "Try to get the help I need, get on some medicine that helps...depression." Educational / Learning stressors: None identified Employment / Job issues: Pt working two jobs. He states that it is dificult for him to maintain employment. Family Relationships: Pt feels that they should also be able to call him and that he should not be the one who is always reaching out to them. Financial / Lack of resources (include bankruptcy): None identified Housing / Lack of housing: Pt stays with friends but states that he wants to find his own house. Physical health (include injuries & life threatening diseases): None reported Social relationships: "Not able to see my daughter." Substance abuse: "Drink quite a bit normally." Bereavement / Loss: None reported  Living/Environment/Situation:  Living Arrangements: Non-relatives/Friends Living conditions (as described by patient or guardian): "Aight, unless I get annoyed." Who else lives in the home?: Living with friends How long has patient lived in current situation?: "On and off over the years." What is atmosphere in current home: Comfortable,Temporary  Family History:  Marital status: Single Are you sexually active?: No What is your sexual orientation?: Heterosexual Has your sexual activity been affected by drugs, alcohol, medication, or emotional stress?: None identified Does patient have children?: Yes How many children?: 68 (15 month old) How is patient's relationship with their children?:  Estranged; pt reports issues with seeing daughter  Childhood History:  By whom was/is the patient raised?: Both parents Additional childhood history information: Parents divorced when pt was in eighth grade. Description of patient's relationship with caregiver when they were a child: "Hit and miss at times." Patient's description of current relationship with people who raised him/her: "Same now" as when he was a child. How were you disciplined when you got in trouble as a child/adolescent?: "Things taken away from me, whooped, things like that." Does patient have siblings?: Yes Number of Siblings: 2 (one older brother and a twin that is older by a few seconds.) Description of patient's current relationship with siblings: He describes his oldest brother as the golden child. He states that he wants to "beat the hell ouf of" his twin. Did patient suffer any verbal/emotional/physical/sexual abuse as a child?: No Did patient suffer from severe childhood neglect?: No Has patient ever been sexually abused/assaulted/raped as an adolescent or adult?: No Was the patient ever a victim of a crime or a disaster?: No Witnessed domestic violence?: No Has patient been affected by domestic violence as an adult?: No  Education:  Highest grade of school patient has completed: High school graduate Currently a student?: No Learning disability?: Yes What learning problems does patient have?: He reports he was in [facility] classes for AD/HD or ADD.  Employment/Work Situation:   Employment situation: Employed Where is patient currently employed?: [employer] How long has patient been employed?: Since October 2021 Patient's job has been impacted by current illness: Yes Describe how patient's job has been impacted: He stated that he finds it hard to focus and gets frustrated with what he is doing. Pt reports he used to be  on disability as a child but was taken off due to parents'  income. What is the longest time patient has a held a job?: "Little over a year" Where was the patient employed at that time?: "At a couple of jobs" Has patient ever been in the [employer]?: No  Financial Resources:   Surveyor, quantity resources: Income from [employer] Does patient have a representative payee or guardian?: No  Alcohol/Substance Abuse:   What has been your use of drugs/alcohol within the last 12 months?: He states that he does not drink on Thursdays, Fridays, or Saturdays. He reports drinking at least one bootlegger and at most an 18 pack. If attempted suicide, did drugs/alcohol play a role in this?: Yes (He states that he was drunk when he tried to hang himself.) Alcohol/Substance Abuse Treatment Hx: Denies past history If yes, describe treatment: N/A Has alcohol/substance abuse ever caused legal problems?: No (Pt states that he does not feel he needs treatment for his alcohol use.)  Social Support System:   Patient's Community Support System: Good Describe Community Support System: "Pretty good" Type of faith/religion: Pt denies any How does patient's faith help to cope with current illness?: Pt denies any faith  Leisure/Recreation:   Do You Have Hobbies?: Yes Leisure and Hobbies: "Go fishing, play golf. I like playing pool and bowling."  Strengths/Needs:   What is the patient's perception of their strengths?: "Playing pool, I'll take your money." Patient states they can use these personal strengths during their treatment to contribute to their recovery: N/A Patient states these barriers may affect/interfere with their treatment: Pt denies any outside of working times. Patient states these barriers may affect their return to the community: Pt denies Other important information patient would like considered in planning for their treatment: He wants them to be flexible to his work schedule.  Discharge Plan:   Currently receiving community mental health services: No  (Has history with [facility], says last went in 2016 or 2017.) Patient states concerns and preferences for aftercare planning are: He reports he would reconnect with [facility]. Patient states they will know when they are safe and ready for discharge when: "Just depends on how I feel the medication is doing." Does patient have access to transportation?: Yes Does patient have financial barriers related to discharge medications?: No Patient description of barriers related to discharge medications: He denies any but does not have insurance but states he can pay for it. Will patient be returning to same living situation after discharge?: Yes  Summary/Recommendations:   Summary and Recommendations (to be completed by the evaluator): Patient is a 35 year old, single, father of one (51-month-old who is with child's mother) from [place], [place] [facility] [place]). He stated that he came to the hospital because he tried to hang himself while under the influence of alcohol in the context of not being able to see his daughter and his grandfather being sick. Patient stated desire to get on some medication to make sure that this does not continue to happen (specifically depression) but does not feel that his alcohol use is an issue. He is currently stating with some friends and plans to return there upon discharge. Patient is employed but does not have insurance. Past outpatient treatment through [facility] endorsed with last visit in 2016-17. He expresses some interest in continued outpatient upon discharge at least for medication management. 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 Recommendations include crisis stabilization, therapeutic milieu, encourage group attendance and participation, medication  management for detox/mood stabilization, and development of comprehensive mental wellness/sobriety plan.  Glenis Smoker. 04/03/2020

## 2020-04-03 NOTE — Progress Notes (Signed)
Pt is alert and oriented to person, place, time and situation. PT is calm, cooperative, pleasant, social with staff and peers. Pt's affect is bright, smiles on contact, pt is polite. Pt spends some time watching tv in the dayroom, otherwise rests in bed. Pt is medication compliant, appetite is good. Pt denies suicidal ideation at this time, denies homicidal ideation, denies hallucinations. Pt reports that at times throughout the day he will have passive suicidal ideation without plan or intent, fleeting thought, contracts verbally for safety. Will continue to monitor pt per Q15 minute face checks and monitor for safety and progress.

## 2020-04-03 NOTE — BHH Group Notes (Signed)
LCSW Group Therapy Note     04/03/2020 2:01 PM     Type of Therapy/Topic:  Group Therapy:  Balance in Life     Participation Level:  Did Not Attend     Description of Group:    This group will address the concept of balance and how it feels and looks when one is unbalanced. Patients will be encouraged to process areas in their lives that are out of balance and identify reasons for remaining unbalanced. Facilitators will guide patients in utilizing problem-solving interventions to address and correct the stressor making their life unbalanced. Understanding and applying boundaries will be explored and addressed for obtaining and maintaining a balanced life. Patients will be encouraged to explore ways to assertively make their unbalanced needs known to significant others in their lives, using other group members and facilitator for support and feedback.     Therapeutic Goals:  1.      Patient will identify two or more emotions or situations they have that consume much of in their lives.  2.      Patient will identify signs/triggers that life has become out of balance:  3.      Patient will identify two ways to set boundaries in order to achieve balance in their lives:  4.      Patient will demonstrate ability to communicate their needs through discussion and/or role plays     Summary of Patient Progress: X      Therapeutic Modalities:   Cognitive Behavioral Therapy  Solution-Focused Therapy  Assertiveness Training     Felesha Moncrieffe MSW, LCSW-A  04/03/2020 2:01 PM  

## 2020-04-03 NOTE — Progress Notes (Signed)
BRIEF PHARMACY NOTE   This patient attended and participated in Medication Management Group counseling led by [facility] staff pharmacist.  This interactive class reviews basic information about prescription medications and education on personal responsibility in medication management.  The class also includes general knowledge of 3 main classes of behavioral medications, including antipsychotics, antidepressants, and mood stabilizers.     Patient behavior was appropriate for group setting.   Educational materials sourced from:  "Medication Do's and Don'ts" from [email or web address]   "Mental Health Medications" from [facility] FaxRack.tn.shtml#part 456256   Albina Billet, PharmD, BCPS Clinical Pharmacist 04/03/2020 3:13 PM

## 2020-04-03 NOTE — Plan of Care (Signed)
Patient new to the unit tonight, hasn't had time to progress  Problem: Education: Goal: Knowledge of [facility] Education information/materials will improve Outcome: Not Progressing Goal: Emotional status will improve Outcome: Not Progressing Goal: Mental status will improve Outcome: Not Progressing Goal: Verbalization of understanding the information provided will improve Outcome: Not Progressing   Problem: Safety: Goal: Periods of time without injury will increase Outcome: Not Progressing   Problem: Education: Goal: Ability to make informed decisions regarding treatment will improve Outcome: Not Progressing   Problem: Medication: Goal: Compliance with prescribed medication regimen will improve Outcome: Not Progressing   

## 2020-04-03 NOTE — Progress Notes (Signed)
Recreation Therapy Notes  Date: 04/03/2020  Time: 9:30 am  Location: Craft room    Behavioral response: Appropriate   Intervention Topic: Animal Assisted Therapy   Discussion/Intervention:  Animal Assisted Therapy took place today during group.  Animal Assisted Therapy is the planned inclusion of an animal in a patient's treatment plan. The patients were able to engage in therapy with an animal during group. Participants were educated on what a service dog is and the different between a support dog and a service dog. Patient were informed on the many animal needs there are and how their needs are similar. Individuals were enlightened on the process to get a service animal or support animal. Patients got the opportunity to pet the animal and were offered emotional support from the animal and staff.  Clinical Observations/Feedback:  Patient came to group late and was on topic and was focused on what peers and staff had to say. Participant shared their experiences and history with animals.  Individual was social with peers, staff and animal while participating in group.  Shaverence Outlaw LRT/CTRS          Shaverence  Outlaw 04/03/2020 12:45 PM

## 2020-04-03 NOTE — [facility] Suicide Risk Assessment (Signed)
[facility] INPATIENT:  Family/Significant Other Suicide Prevention Education  Suicide Prevention Education:  Contact Attempts: [name] Patrum/friend [phone]), has been identified by the patient as the family member/significant other with whom the patient will be residing, and identified as the person(s) who will aid the patient in the event of a mental health crisis.  With written consent from the patient, two attempts were made to provide suicide prevention education, prior to and/or following the patient's discharge.  We were unsuccessful in providing suicide prevention education.  A suicide education pamphlet was given to the patient to share with family/significant other.  Date and time of first attempt: 04/03/20 at 1:51pm Date and time of second attempt: Second attempt needed  Joshua [name] 04/03/2020, 1:52 PM

## 2020-04-03 NOTE — Tx Team (Signed)
Initial Treatment Plan 04/03/2020 12:00 AM Shuayb Schepers VUY:[id number]    PATIENT STRESSORS: Medication change or noncompliance Substance abuse   PATIENT STRENGTHS: Ability for insight Supportive family/friends   PATIENT IDENTIFIED PROBLEMS: Suicidal Ideation  Depression                   DISCHARGE CRITERIA:  Improved stabilization in mood, thinking, and/or behavior Verbal commitment to aftercare and medication compliance  PRELIMINARY DISCHARGE PLAN: Outpatient therapy Return to previous living arrangement  PATIENT/FAMILY INVOLVEMENT: This treatment plan has been presented to and reviewed with the patient, Brian Zeitlin. The patient has been given the opportunity to ask questions and make suggestions.  Elmyra Ricks, RN 04/03/2020, 12:00 AM
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
## 2020-04-04 ENCOUNTER — Inpatient Hospital Stay: Payer: Self-pay

## 2020-04-04 NOTE — Tx Team (Signed)
Interdisciplinary Treatment and Diagnostic Plan Update  04/04/2020 Time of Session: 9:00AM Joshua [name] MRN: [id number]  Principal Diagnosis: Severe recurrent major depression without psychotic features [facility])  Secondary Diagnoses: Principal Problem:   Severe recurrent major depression without psychotic features ([place]) Active Problems:   ADD (attention deficit disorder)   Alcohol use disorder, severe, dependence ([place])   Current Medications:  Current Facility-Administered Medications  Medication Dose Route Frequency Provider Last Rate Last Admin  . acetaminophen (TYLENOL) tablet 650 mg  650 mg Oral Q6H PRN Clapacs, John T, MD      . alum & mag hydroxide-simeth (MAALOX/MYLANTA) 200-200-20 MG/5ML suspension 30 mL  30 mL Oral Q4H PRN Clapacs, John T, MD      . buPROPion (WELLBUTRIN XL) 24 hr tablet 150 mg  150 mg Oral Daily Selina Cooley M, MD   150 mg at 04/04/20 0818  . hydrOXYzine (ATARAX/VISTARIL) tablet 50 mg  50 mg Oral TID PRN Clapacs, Madie Reno, MD   50 mg at 04/03/20 2131  . magnesium hydroxide (MILK OF MAGNESIA) suspension 30 mL  30 mL Oral Daily PRN Clapacs, John T, MD      . multivitamin with minerals tablet 1 tablet  1 tablet Oral Daily Salley Scarlet, MD   1 tablet at 04/04/20 0818  . naltrexone (DEPADE) tablet 50 mg  50 mg Oral Daily Salley Scarlet, MD   50 mg at 04/04/20 0818  . nicotine polacrilex (NICORETTE) gum 2 mg  2 mg Oral Q4H PRN Salley Scarlet, MD      . thiamine tablet 100 mg  100 mg Oral Daily Salley Scarlet, MD   100 mg at 04/04/20 0818  . traZODone (DESYREL) tablet 100 mg  100 mg Oral QHS PRN Clapacs, Madie Reno, MD   100 mg at 04/03/20 2131   PTA Medications: No medications prior to admission.    Patient Stressors: Medication change or noncompliance Substance abuse  Patient Strengths: Ability for insight Supportive family/friends  Treatment Modalities: Medication Management, Group therapy, Case management,  1 to 1 session with clinician,  Psychoeducation, Recreational therapy.   Physician Treatment Plan for Primary Diagnosis: Severe recurrent major depression without psychotic features ([place]) Long Term Goal(s): Improvement in symptoms so as ready for discharge Improvement in symptoms so as ready for discharge   Short Term Goals: Ability to identify changes in lifestyle to reduce recurrence of condition will improve Ability to verbalize feelings will improve Ability to disclose and discuss suicidal ideas Ability to demonstrate self-control will improve Ability to identify and develop effective coping behaviors will improve Ability to maintain clinical measurements within normal limits will improve Compliance with prescribed medications will improve Ability to identify triggers associated with substance abuse/mental health issues will improve Ability to identify changes in lifestyle to reduce recurrence of condition will improve Ability to verbalize feelings will improve Ability to disclose and discuss suicidal ideas Ability to demonstrate self-control will improve Ability to identify and develop effective coping behaviors will improve Ability to maintain clinical measurements within normal limits will improve Compliance with prescribed medications will improve Ability to identify triggers associated with substance abuse/mental health issues will improve  Medication Management: Evaluate patient's response, side effects, and tolerance of medication regimen.  Therapeutic Interventions: 1 to 1 sessions, Unit Group sessions and Medication administration.  Evaluation of Outcomes: Not Met  Physician Treatment Plan for Secondary Diagnosis: Principal Problem:   Severe recurrent major depression without psychotic features ([place]) Active Problems:   ADD (attention deficit disorder)  Alcohol use disorder, severe, dependence ([place])  Long Term Goal(s): Improvement in symptoms so as ready for discharge Improvement in symptoms so as  ready for discharge   Short Term Goals: Ability to identify changes in lifestyle to reduce recurrence of condition will improve Ability to verbalize feelings will improve Ability to disclose and discuss suicidal ideas Ability to demonstrate self-control will improve Ability to identify and develop effective coping behaviors will improve Ability to maintain clinical measurements within normal limits will improve Compliance with prescribed medications will improve Ability to identify triggers associated with substance abuse/mental health issues will improve Ability to identify changes in lifestyle to reduce recurrence of condition will improve Ability to verbalize feelings will improve Ability to disclose and discuss suicidal ideas Ability to demonstrate self-control will improve Ability to identify and develop effective coping behaviors will improve Ability to maintain clinical measurements within normal limits will improve Compliance with prescribed medications will improve Ability to identify triggers associated with substance abuse/mental health issues will improve     Medication Management: Evaluate patient's response, side effects, and tolerance of medication regimen.  Therapeutic Interventions: 1 to 1 sessions, Unit Group sessions and Medication administration.  Evaluation of Outcomes: Not Met   RN Treatment Plan for Primary Diagnosis: Severe recurrent major depression without psychotic features ([place]) Long Term Goal(s): Knowledge of disease and therapeutic regimen to maintain health will improve  Short Term Goals: Ability to remain free from injury will improve, Ability to verbalize frustration and anger appropriately will improve, Ability to demonstrate self-control, Ability to participate in decision making will improve, Ability to verbalize feelings will improve, Ability to disclose and discuss suicidal ideas, Ability to identify and develop effective coping behaviors will  improve and Compliance with prescribed medications will improve  Medication Management: RN will administer medications as ordered by provider, will assess and evaluate patient's response and provide education to patient for prescribed medication. RN will report any adverse and/or side effects to prescribing provider.  Therapeutic Interventions: 1 on 1 counseling sessions, Psychoeducation, Medication administration, Evaluate responses to treatment, Monitor vital signs and CBGs as ordered, Perform/monitor CIWA, COWS, AIMS and Fall Risk screenings as ordered, Perform wound care treatments as ordered.  Evaluation of Outcomes: Not Met   LCSW Treatment Plan for Primary Diagnosis: Severe recurrent major depression without psychotic features ([place]) Long Term Goal(s): Safe transition to appropriate next level of care at discharge, Engage patient in therapeutic group addressing interpersonal concerns.  Short Term Goals: Engage patient in aftercare planning with referrals and resources, Increase social support, Increase ability to appropriately verbalize feelings, Increase emotional regulation, Facilitate acceptance of mental health diagnosis and concerns, Facilitate patient progression through stages of change regarding substance use diagnoses and concerns, Identify triggers associated with mental health/substance abuse issues and Increase skills for wellness and recovery  Therapeutic Interventions: Assess for all discharge needs, 1 to 1 time with Social worker, Explore available resources and support systems, Assess for adequacy in community support network, Educate family and significant other(s) on suicide prevention, Complete Psychosocial Assessment, Interpersonal group therapy.  Evaluation of Outcomes: Not Met   Progress in Treatment: Attending groups: No. Participating in groups: No. Taking medication as prescribed: Yes. Toleration medication: Yes. Family/Significant other contact made: No, will  contact:  attempt made to contact friend, Melissa. Patient understands diagnosis: Yes. Discussing patient identified problems/goals with staff: Yes. Medical problems stabilized or resolved: Yes. Denies suicidal/homicidal ideation: Yes. Issues/concerns per patient self-inventory: No. Other: None.  New problem(s) identified: No, Describe:  None.  New  Short Term/Long Term Goal(s): detox, medication management for mood stabilization; elimination of SI thoughts; development of comprehensive mental wellness/sobriety plan.  Patient Goals: "Get up with someone about my medications."   Discharge Plan or Barriers: CSW will assist pt with development of an appropriate discharge/aftercare plan.  Reason for Continuation of Hospitalization: Anxiety Depression Medication stabilization Suicidal ideation Withdrawal symptoms  Estimated Length of Stay: 1-7 days  Attendees: Patient: Joshua [name] 04/04/2020 9:34 AM  Physician: Selina Cooley, MD 04/04/2020 9:34 AM  Nursing: Vivianne Spence, RN 04/04/2020 9:34 AM  RN Care Manager: 04/04/2020 9:34 AM  Social Worker: Chalmers Guest. Guerry Bruin, MSW, [place], [place] 04/04/2020 9:34 AM  Recreational Therapist: Devin Going, LRT  04/04/2020 9:34 AM  Other: Assunta Curtis, MSW, LCSW 04/04/2020 9:34 AM  Other:  04/04/2020 9:34 AM  Other: 04/04/2020 9:34 AM    Scribe for Treatment Team: Shirl Harris, LCSW 04/04/2020 9:34 AM
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
## 2020-04-04 NOTE — [facility] Group Notes (Signed)
LCSW Group Therapy Note  04/04/2020 12:31 PM  Type of Therapy/Topic:  Group Therapy:  Emotion Regulation  Participation Level:  Active   Description of Group:   The purpose of this group is to assist patients in learning to regulate negative emotions and experience positive emotions. Patients will be guided to discuss ways in which they have been vulnerable to their negative emotions. These vulnerabilities will be juxtaposed with experiences of positive emotions or situations, and patients will be challenged to use positive emotions to combat negative ones. Special emphasis will be placed on coping with negative emotions in conflict situations, and patients will process healthy conflict resolution skills.  Therapeutic Goals: 1. Patient will identify two positive emotions or experiences to reflect on in order to balance out negative emotions 2. Patient will label two or more emotions that they find the most difficult to experience 3. Patient will demonstrate positive conflict resolution skills through discussion and/or role plays  Summary of Patient Progress: Patient was present in group.  Patient reports that when he thinks of emotion regulation he thinks "going home".  He reports that he has felt the following emotions "everything is all over the place and getting like I need help".  He reports that his triggers are "drinking, talking to others, loss and places that you go".  He reports that he has dealt with feelings of being overwhelmed "by doing things with some friends".   Therapeutic Modalities:   Cognitive Behavioral Therapy Feelings Identification Dialectical Behavioral Therapy  Clancy Mullarkey, MSW, LCSW 04/04/2020 12:31 PM

## 2020-04-04 NOTE — Progress Notes (Signed)
Patient alert and oriented x 4, affect is flat thoughts are organized and coherent, he rated pain a 5/10 around his lower ABD he started " it feels like a sharp pain" ABD appears undistended, last BM was 04/03/20, BS X 4 present, he was compliant with medication regimen, noted interrelating appropriately with peers ands staff, 15 minutes safety checks ,aimtained will continue to monitor .

## 2020-04-04 NOTE — Progress Notes (Signed)
Pt has been calm, cooperative, pleasant, alert and oriented to person, place, time and situation. Pt c/o abdominal pain, MD ordered CT scan, which was completed. Pt has been social with peers, and staff, watching tv, is medication compliant, denies suicidal and homicidal ideation, denies hallucinations, reports anxiety and depression, rates them 4/10 on a 0-10 scale, 10 being worst. Will continue to monitor pt per Q15 minute face checks and monitor for safety and progress.

## 2020-04-05 MED ORDER — DOCUSATE SODIUM 100 MG PO CAPS
100.0000 mg | ORAL_CAPSULE | Freq: Two times a day (BID) | ORAL | Status: DC
  Administered 2020-04-05 – 2020-04-07 (×4): 100 mg via ORAL
  Filled 2020-04-05 (×4): qty 1

## 2020-04-05 MED ORDER — LISINOPRIL 5 MG PO TABS
5.0000 mg | ORAL_TABLET | Freq: Every day | ORAL | Status: DC
  Administered 2020-04-05 – 2020-04-07 (×3): 5 mg via ORAL
  Filled 2020-04-05 (×3): qty 1

## 2020-04-05 NOTE — Progress Notes (Signed)
Patient is pleasant cooperative and easy to engage in conversation.  He is active on the unit and has been in the milieu alone watching TV.  He reports feeling ready to leave and plans to follow up with [facility] once he is discharged.  He denies SI  HI AVH depression and pain at this encounter. He does endorse some mild anxiety, but reports that it stems from activity on the unit having him feeling anxious. He received PRN medications to help with symptoms. He was encouraged to come staff with any concerns and remains safe with 15 minute safety rounds.   Cleo Butler-Nicholson, LPN

## 2020-04-05 NOTE — Progress Notes (Signed)
Pt is alert and oriented to person, place, time and situation. Pt is calm, cooperative, denies suicidal and homicidal ideation, denies hallucinations, denies depression, reports anxiety. Pt is social with peers and staff. 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 Pt's appetite is good. Pt continues to c/o intermittent abdominal pain. Pt offered PRN medication for constipation but refused. Pt later given scheduled medication, stool softer. Will continue to monitor pt per Q15 minute face checks and monitor for safety and progress.
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
## 2020-04-05 NOTE — [facility] Group Notes (Signed)
CSW spoke with patient about his discharge plan due to no other group members. Patient stated he does not want to go to rehab for weeks because he wants to work. Patient stated he would like to continue with outpatient treatment. Patient stated the plan is for him to go to [facility].

## 2020-04-05 NOTE — Plan of Care (Signed)
  Problem: Education: Goal: Knowledge of [facility] Education information/materials will improve Outcome: Progressing Goal: Emotional status will improve Outcome: Progressing Goal: Mental status will improve Outcome: Progressing Goal: Verbalization of understanding the information provided will improve Outcome: Progressing   

## 2020-04-05 NOTE — [facility] Group Notes (Signed)
Patient present to NA Group.

## 2020-04-05 NOTE — Progress Notes (Incomplete)
Patient is pleasant cooperative and easy to engage in conversation.  He is active on the unit and has been in the milieu alone watching TV.  He reports feeling ready to leave the unit and plans to follow up with [facility] once he is discaheHe denies SI  HI AVH depression and pain at this encounter. He does endorse some mild anxiety, but reports that it stems from activity on the unit having him feeling anxious. He received PRN medications to help with symptoms

## 2020-04-05 NOTE — Progress Notes (Addendum)
Patient alert and oriented x 4, affect is flat , thoughts are organized and coherent, he denies SI/HI/AVH he was noted in the dayroom interacting appropriately with peers and staff. Patient was complaint with medication regimen, 15 minutes safety checks maintained will continue to monitor closely.

## 2020-04-06 NOTE — Plan of Care (Signed)
  Problem: Education: Goal: Knowledge of [name] Education information/materials will improve Outcome: Progressing Goal: Emotional status will improve Outcome: Progressing Goal: Mental status will improve Outcome: Progressing Goal: Verbalization of understanding the information provided will improve Outcome: Progressing   Problem: Safety: Goal: Periods of time without injury will increase Outcome: Progressing   Problem: Education: Goal: Ability to make informed decisions regarding treatment will improve Outcome: Progressing   Problem: Medication: Goal: Compliance with prescribed medication regimen will improve Outcome: Progressing   

## 2020-04-06 NOTE — Progress Notes (Signed)
Patient is A & O to person, place, time, and situation.  He is calm, cooperative, denies SI/HI and depression.  He endorses anxiety.  Pt is social and cooperative with staff and peers.  Patient attended N/A meeting, and is speaking appropriately with the nursing staff on students on the unit.  His appetite is good.  He accepted his medication without any issues.  Stated he slept very well, he was given trazodone at bedtime.  He states its important to get back to work sometime this week.

## 2020-04-06 NOTE — Progress Notes (Signed)
Patient is pleasant and cooperative. He has been active on the unit watching TV and hanging out in the milieu.  He denies SI  HI  AVH depression and pain at this encounter. He continues to endorse mild anxiety. He requested and received PRN medication for anxiety and sleep. He reports being ready for discharge and is hoping to leave before he has to return to work on Friday.  He was encouraged to come to staff with any concerns and remains safe with 15 minute safety rounds.     Cleo Butler-Nicholson, LPN

## 2020-04-06 NOTE — Progress Notes (Signed)
[facility] MD Progress Note  04/06/2020 11:55 AM Arwin Bisceglia  MRN:  [id number] Subjective: Follow-up patient with depression and alcohol abuse.  Patient seen chart reviewed.  Patient tells me he is feeling much better than when he first came in.  He is not having any suicidal thoughts at all.  He is optimistic and hopeful about the future.  He is thinking about plans for where he will stay on his work plans.  He is perhaps a little too optimistic about staying off of alcohol given the struggles he has had with it.  Nevertheless not currently suicidal homicidal or psychotic.  Vitals looking good and stable no new lab studies. Principal Problem: Severe recurrent major depression without psychotic features (HCC) Diagnosis: Principal Problem:   Severe recurrent major depression without psychotic features (HCC) Active Problems:   ADD (attention deficit disorder)   Alcohol use disorder, severe, dependence (HCC)  Total Time spent with patient: 30 minutes  Past Psychiatric History: Past history of alcohol abuse and depression with recent suicide attempt  Past Medical History:  Past Medical History:  Diagnosis Date  . ADD (attention deficit disorder)   . Depression   . Paranoid [facility])     Past Surgical History:  Procedure Laterality Date  . HERNIA REPAIR     Family History: History reviewed. No pertinent family history. Family Psychiatric  History: See previous Social History:  Social History   Substance and Sexual Activity  Alcohol Use Yes   Comment: occassional 24oz beer at night     Social History   Substance and Sexual Activity  Drug Use No    Social History   Socioeconomic History  . Marital status: Single    Spouse name: Not on file  . Number of children: Not on file  . Years of education: Not on file  . Highest education level: Not on file  Occupational History  . Not on file  Tobacco Use  . Smoking status: Current Every Day Smoker    Packs/day: 0.50    Types: Cigarettes   . Smokeless tobacco: Never Used  Substance and Sexual Activity  . Alcohol use: Yes    Comment: occassional 24oz beer at night  . Drug use: No  . Sexual activity: Not on file  Other Topics Concern  . Not on file  Social History Narrative  . Not on file   Social Determinants of Health   Financial Resource Strain: Not on file  Food Insecurity: Not on file  Transportation Needs: Not on file  Physical Activity: Not on file  Stress: Not on file  Social Connections: Not on file   Additional Social History:                         Sleep: Fair  Appetite:  Fair  Current Medications: Current Facility-Administered Medications  Medication Dose Route Frequency Provider Last Rate Last Admin  . acetaminophen (TYLENOL) tablet 650 mg  650 mg Oral Q6H PRN Benaiah Behan, Jackquline Denmark, MD   650 mg at 04/04/20 2114  . alum & mag hydroxide-simeth (MAALOX/MYLANTA) 200-200-20 MG/5ML suspension 30 mL  30 mL Oral Q4H PRN Umair Rosiles T, MD      . buPROPion (WELLBUTRIN XL) 24 hr tablet 150 mg  150 mg Oral Daily Jesse Sans, MD   150 mg at 04/06/20 4967  . docusate sodium (COLACE) capsule 100 mg  100 mg Oral BID Jabron Weese, Jackquline Denmark, MD   100 mg at 04/06/20 5916  .  hydrOXYzine (ATARAX/VISTARIL) tablet 50 mg  50 mg Oral TID PRN Normagene Harvie, Jackquline Denmark, MD   50 mg at 04/05/20 2123  . lisinopril (ZESTRIL) tablet 5 mg  5 mg Oral Daily Shonita Rinck, Jackquline Denmark, MD   5 mg at 04/06/20 6761  . magnesium hydroxide (MILK OF MAGNESIA) suspension 30 mL  30 mL Oral Daily PRN Ellory Khurana T, MD      . multivitamin with minerals tablet 1 tablet  1 tablet Oral Daily Jesse Sans, MD   1 tablet at 04/06/20 9509  . naltrexone (DEPADE) tablet 50 mg  50 mg Oral Daily Jesse Sans, MD   50 mg at 04/06/20 0834  . nicotine polacrilex (NICORETTE) gum 2 mg  2 mg Oral Q4H PRN Jesse Sans, MD      . thiamine tablet 100 mg  100 mg Oral Daily Jesse Sans, MD   100 mg at 04/06/20 3267  . traZODone (DESYREL) tablet 100 mg  100 mg  Oral QHS PRN Jakalyn Kratky, Jackquline Denmark, MD   100 mg at 04/05/20 2123    Lab Results: No results found for this or any previous visit (from the past 48 hour(s)).  Blood Alcohol level:  Lab Results  Component Value Date   ETH 143 (H) 04/01/2020   ETH 188 (H) 03/28/2020    Metabolic Disorder Labs: No results found for: HGBA1C, MPG No results found for: PROLACTIN No results found for: CHOL, TRIG, HDL, CHOLHDL, VLDL, LDLCALC  Physical Findings: AIMS:  , ,  ,  ,    CIWA:    COWS:     Musculoskeletal: Strength & Muscle Tone: within normal limits Gait & Station: normal Patient leans: N/A  Psychiatric Specialty Exam:  Presentation  General Appearance: Casual  Eye Contact:Fair  Speech:Clear and Coherent  Speech Volume:Normal  Handedness:Right   Mood and Affect  Mood:Irritable  Affect:Congruent   Thought Process  Thought Processes:Coherent  Descriptions of Associations:Intact  Orientation:Full (Time, Place and Person)  Thought Content:Logical  History of Schizophrenia/Schizoaffective disorder:No  Duration of Psychotic Symptoms:No data recorded Hallucinations:No data recorded Ideas of Reference:None  Suicidal Thoughts:No data recorded Homicidal Thoughts:No data recorded  Sensorium  Memory:Immediate Good; Recent Good; Remote Good  Judgment:Intact  Insight:Shallow   Executive Functions  Concentration:Fair  Attention Span:Fair  Recall:Fair  Fund of Knowledge:Fair  Language:Fair   Psychomotor Activity  Psychomotor Activity:No data recorded  Assets  Assets:Communication Skills; Desire for Improvement; Housing; Leisure Time; Physical Health; Resilience; Social Support   Sleep  Sleep:No data recorded   Physical Exam: Physical Exam Vitals and nursing note reviewed.  Constitutional:      Appearance: Normal appearance.  HENT:     Head: Normocephalic and atraumatic.     Mouth/Throat:     Pharynx: Oropharynx is clear.  Eyes:     Pupils: Pupils  are equal, round, and reactive to light.  Cardiovascular:     Rate and Rhythm: Normal rate and regular rhythm.  Pulmonary:     Effort: Pulmonary effort is normal.     Breath sounds: Normal breath sounds.  Abdominal:     General: Abdomen is flat.     Palpations: Abdomen is soft.  Musculoskeletal:        General: Normal range of motion.  Skin:    General: Skin is warm and dry.  Neurological:     General: No focal deficit present.     Mental Status: He is alert. Mental status is at baseline.  Psychiatric:  Mood and Affect: Mood normal.        Thought Content: Thought content normal.    Review of Systems  Constitutional: Negative.   HENT: Negative.   Eyes: Negative.   Respiratory: Negative.   Cardiovascular: Negative.   Gastrointestinal: Negative.   Musculoskeletal: Negative.   Skin: Negative.   Neurological: Negative.   Psychiatric/Behavioral: Negative.    Blood pressure 128/78, pulse (!) 51, temperature 98.3 F (36.8 C), temperature source Oral, resp. rate 18, height 5\' 4"  (1.626 m), weight 73 kg, SpO2 100 %. Body mass index is 27.64 kg/m.   Treatment Plan Summary: Plan Patient looks like he is doing much better.  He tells me very explicitly that he thinks he needs to stay in the hospital until "Tuesday or Wednesday".  It sounds like that may be chosen on the grounds of when somebody will let him stay with them.  Encourage patient to think about the future.  No change to any medication orders.  Thursday, MD 04/06/2020, 11:55 AM

## 2020-04-06 NOTE — [facility] Group Notes (Signed)
LCSW Group Therapy 04/06/20: 12:20 PM- 1:00 PM    Type of Therapy and Topic:  Group Therapy:  Setting Goals   Participation Level:  Active   Description of Group: In this process group, patients discussed using strengths to work toward goals and address challenges.  Patients identified two positive things about themselves and one goal they were working on.  Patients were given the opportunity to share openly and support each other's plan for self-empowerment.  The group discussed the value of gratitude and were encouraged to have a daily reflection of positive characteristics or circumstances.  Patients were encouraged to identify a plan to utilize their strengths to work on current challenges and goals.   Therapeutic Goals 1. Patient will verbalize personal strengths/positive qualities and relate how these can assist with achieving desired personal goals 2. Patients will verbalize affirmation of peers plans for personal change and goal setting 3. Patients will explore the value of gratitude and positive focus as related to successful achievement of goals 4. Patients will verbalize a plan for regular reinforcement of personal positive qualities and circumstances.   Summary of Patient Progress: Patient stated his first goal is to get out of the hospital. Patient stated his plan was to continue to take his medication and go back to work. Patient stated he is thinking about going back to [facility] but if the medication is working then what is the point. CSW encouraged patient to continue with counseling and work on his sobriety. Patient admitted if he goes to a bar that he shouldn't drink. Patient is also looking forward to his 31 B being lifted on April 5th so he can see his daughter. Patient stated he does not know his daughters location but believes they still reside in [place]. Patient also spoke about getting a place of his own and possibly his own home in the next 5 years.        Therapeutic  Modalities Cognitive Behavioral Therapy Motivational Interviewing

## 2020-04-07 ENCOUNTER — Other Ambulatory Visit (HOSPITAL_COMMUNITY): Payer: Self-pay | Admitting: Behavioral Health

## 2020-04-07 MED ORDER — TRAZODONE HCL 100 MG PO TABS: 100.0000 mg | ORAL_TABLET | Freq: Every evening | ORAL | 0 refills | Status: DC | PRN

## 2020-04-07 MED ORDER — LISINOPRIL 5 MG PO TABS: 5.0000 mg | ORAL_TABLET | Freq: Every day | ORAL | 1 refills | Status: DC

## 2020-04-07 MED ORDER — BUPROPION HCL ER (XL) 150 MG PO TB24: 150.0000 mg | ORAL_TABLET | Freq: Every day | ORAL | 1 refills | Status: DC

## 2020-04-07 MED ORDER — TRAZODONE HCL 100 MG PO TABS: 100.0000 mg | ORAL_TABLET | Freq: Every evening | ORAL | 1 refills | Status: DC | PRN

## 2020-04-07 MED ORDER — HYDROXYZINE HCL 50 MG PO TABS: 50.0000 mg | ORAL_TABLET | Freq: Three times a day (TID) | ORAL | 1 refills | Status: DC | PRN

## 2020-04-07 MED ORDER — NALTREXONE HCL 50 MG PO TABS: 50.0000 mg | ORAL_TABLET | Freq: Every day | ORAL | 0 refills | Status: DC

## 2020-04-07 MED ORDER — BUPROPION HCL ER (XL) 150 MG PO TB24: 150.0000 mg | ORAL_TABLET | Freq: Every day | ORAL | 0 refills | Status: DC

## 2020-04-07 MED ORDER — NALTREXONE HCL 50 MG PO TABS: 50.0000 mg | ORAL_TABLET | Freq: Every day | ORAL | 1 refills | Status: DC

## 2020-04-07 MED ORDER — HYDROXYZINE HCL 50 MG PO TABS: 50.0000 mg | ORAL_TABLET | Freq: Once | ORAL | Status: DC

## 2020-04-07 MED ORDER — LISINOPRIL 5 MG PO TABS: 5.0000 mg | ORAL_TABLET | Freq: Every day | ORAL | 0 refills | Status: DC

## 2020-04-07 NOTE — Progress Notes (Signed)
Patient denies SI/HI, denies A/V hallucinations. Patient verbalizes understanding of discharge instructions, follow up care and prescriptions. Patient given all belongings from [facility] locker. Patient escorted out by staff, transported by safe transport. 

## 2020-04-07 NOTE — Progress Notes (Signed)
  [facility] Adult Case Management Discharge Plan :  Will you be returning to the same living situation after discharge:  Yes,  pt plans to return to his friend's home. At discharge, do you have transportation home?: Yes,  CSW will arrange transportation home. Do you have the ability to pay for your medications: No.  Release of information consent forms completed and in the chart;  Patient's signature needed at discharge.  Patient to Follow up at:  Follow-up Information    [facility] Follow up.   Why: Your appointment is scheduled for Thursday, 04/10/20 at 9:00 with Samaria. This is an assessment over the phone. Thanks! Contact information: [address] [place] [address] [phone]               Next level of care provider has access to [facility] Link:no  Safety Planning and Suicide Prevention discussed: Yes,  SPE completed with friend, Remi Haggard.  Have you used any form of tobacco in the last 30 days? (Cigarettes, Smokeless Tobacco, Cigars, and/or Pipes): Yes  Has patient been referred to the Quitline?: Patient refused referral  Patient has been referred for addiction treatment: Pt. refused referral  Glenis Smoker, LCSW 04/07/2020, 9:38 AM

## 2020-04-07 NOTE — [facility] Suicide Risk Assessment (Signed)
[facility] Discharge Suicide Risk Assessment   Principal Problem: Severe recurrent major depression without psychotic features [facility]) Discharge Diagnoses: Principal Problem:   Severe recurrent major depression without psychotic features (HCC) Active Problems:   ADD (attention deficit disorder)   Alcohol use disorder, severe, dependence (HCC)   Total Time spent with patient: 35 minutes- 25 minutes face-to-face contact with patient, 10 minutes documentation, coordination of care, scripts   Musculoskeletal: Strength & Muscle Tone: within normal limits Gait & Station: normal Patient leans: N/A  Psychiatric Specialty Exam: Review of Systems  Constitutional: Negative for appetite change and fatigue.  HENT: Negative for rhinorrhea and sore throat.   Eyes: Negative for photophobia and visual disturbance.  Respiratory: Negative for cough and shortness of breath.   Cardiovascular: Negative for chest pain and palpitations.  Gastrointestinal: Negative for nausea and vomiting.  Endocrine: Negative for cold intolerance and heat intolerance.  Genitourinary: Negative for difficulty urinating and dysuria.  Musculoskeletal: Negative for arthralgias and myalgias.  Skin: Negative for rash and wound.  Allergic/Immunologic: Negative for environmental allergies and food allergies.  Neurological: Negative for dizziness and headaches.  Hematological: Negative for adenopathy. Does not bruise/bleed easily.  Psychiatric/Behavioral: Negative for dysphoric mood, hallucinations, sleep disturbance and suicidal ideas.    Blood pressure (!) 144/89, pulse 62, temperature 98.2 F (36.8 C), temperature source Oral, resp. rate 18, height 5\' 4"  (1.626 m), weight 73 kg, SpO2 100 %.Body mass index is 27.64 kg/m.  General Appearance: Casual  Eye Contact::  Good  Speech:  Clear and Coherent and Normal Rate  Volume:  Normal  Mood:  Euthymic  Affect:  Congruent  Thought Process:  Coherent and Linear  Orientation:  Full  (Time, Place, and Person)  Thought Content:  Logical  Suicidal Thoughts:  No  Homicidal Thoughts:  No  Memory:  Immediate;   Fair Recent;   Fair Remote;   Fair  Judgement:  Intact  Insight:  Shallow  Psychomotor Activity:  Normal  Concentration:  Fair  Recall:  [id number] of Knowledge:Fair  Language: Fair  Akathisia:  Negative  Handed:  Right  AIMS (if indicated):     Assets:  Communication Skills Desire for Improvement Housing Physical Health Resilience Social Support Talents/Skills Transportation Vocational/Educational  Sleep:  Number of Hours: 8.5  Cognition: WNL  ADL's:  Intact   Mental Status Per Nursing Assessment::   On Admission:  Suicidal ideation indicated by patient,Self-harm thoughts  Demographic Factors:  Male and Caucasian  Loss Factors: NA  Historical Factors: Prior suicide attempts and Impulsivity  Risk Reduction Factors:   Responsible for children under 51 years of age, Sense of responsibility to family, Employed, Living with another person, especially a relative, Positive social support, Positive therapeutic relationship and Positive coping skills or problem solving skills  Continued Clinical Symptoms:  Depression:   Recent sense of peace/wellbeing Alcohol/Substance Abuse/Dependencies Previous Psychiatric Diagnoses and Treatments  Cognitive Features That Contribute To Risk:  None    Suicide Risk:  Mild:  Suicidal ideation of limited frequency, intensity, duration, and specificity.  There are no identifiable plans, no associated intent, mild dysphoria and related symptoms, good self-control (both objective and subjective assessment), few other risk factors, and identifiable protective factors, including available and accessible social support.   Follow-up Information    [facility] Follow up.   Why: Your appointment is scheduled for Thursday, 04/10/20 at 9:00 with Samaria. This is an assessment over the phone. Thanks! Contact  information: [address] [place]  [phone]               Plan Of Care/Follow-up recommendations:  Activity:  as tolerated Diet:  low sodium heart healthy diet  Jesse Sans, MD 04/07/2020, 9:30 AM

## 2020-04-07 NOTE — Discharge Summary (Signed)
Physician Discharge Summary Note  Patient:  Joshua [name] is an 35 y.o., male MRN:  [id number] DOB:  January 19, 1985 Patient phone:  [phone] (home)  Patient address:   [address] [place] [address],  Total Time spent with patient: 35 minutes- 25 minutes face-to-face contact with patient, 10 minutes documentation, coordination of care, scripts   Date of Admission:  04/02/2020 Date of Discharge: 04/07/2020  Reason for Admission:  35 year old male with depression and alcohol use disorder presenting to emergency room with suicidal ideations  Principal Problem: Severe recurrent major depression without psychotic features [facility]) Discharge Diagnoses: Principal Problem:   Severe recurrent major depression without psychotic features (HCC) Active Problems:   ADD (attention deficit disorder)   Alcohol use disorder, severe, dependence (HCC)   Past Psychiatric History: Past history of ADHD treated with ritalin as a child. History of depression and trial of zoloft he felt did not help. Previous suicide attempt via hanging. Significant alcohol abuse.   Past Medical History:  Past Medical History:  Diagnosis Date  . ADD (attention deficit disorder)   . Depression   . Paranoid [facility])     Past Surgical History:  Procedure Laterality Date  . HERNIA REPAIR     Family History: History reviewed. No pertinent family history. Family Psychiatric  History: Denies Social History:  Social History   Substance and Sexual Activity  Alcohol Use Yes   Comment: occassional 24oz beer at night     Social History   Substance and Sexual Activity  Drug Use No    Social History   Socioeconomic History  . Marital status: Single    Spouse name: Not on file  . Number of children: Not on file  . Years of education: Not on file  . Highest education level: Not on file  Occupational History  . Not on file  Tobacco Use  . Smoking status: Current Every Day Smoker    Packs/day: 0.50    Types:  Cigarettes  . Smokeless tobacco: Never Used  Substance and Sexual Activity  . Alcohol use: Yes    Comment: occassional 24oz beer at night  . Drug use: No  . Sexual activity: Not on file  Other Topics Concern  . Not on file  Social History Narrative  . Not on file   Social Determinants of Health   Financial Resource Strain: Not on file  Food Insecurity: Not on file  Transportation Needs: Not on file  Physical Activity: Not on file  Stress: Not on file  Social Connections: Not on file    Hospital Course:  35 year old male with depression and alcohol use disorder. He notes he tried to hang himself, and was in the emergency room for a few days. He discharged to seek outpatient treatment, but immediately relapsed on alcohol. His mood worsened, suicidal ideations returned, and he came back to the hospital after speaking with family. On screening for bipolar disorder he did not describe any consistent symptoms for 4-7 days suggestive of hypomania or mania. He specifically denied going several days without sleep but feeling awake, he denied euphoria or grandiosity. He  has moments of irritability and anger outbursts, but nothing sustained. No increase in goal directed activity. No history of seizures or eating disorders. He was started on Wellbutrin XL 150 mg daily for depression, Naltrexone 50 mg daily for alcohol use disorder, and given trazodone 100 mg QHS PRN for sleep. While on the unit he felt his mood improved, and he no longer endorsed  suicidal ideations. He declined referral to inpatient detox or rehab program for substance abuse, and decided to follow up outpatient with [facility] for mental health services. He denies suicidal ideations, homicidal ideations, visual hallucinations, and auditory hallucinations. Of note, he did complain of intermittent abdominal pain radiating from flank to groin. However, CT renal stone study was negative for any abnormality in abdomen or pelvis. This pain gradually  resolved on its own.   Physical Findings: AIMS:  , ,  ,  ,    CIWA:    COWS:     Musculoskeletal: Strength & Muscle Tone: within normal limits Gait & Station: normal Patient leans: N/A    Psychiatric Specialty Exam: General Appearance: Casual  Eye Contact::  Good  Speech:  Clear and Coherent and Normal Rate  Volume:  Normal  Mood:  Euthymic  Affect:  Congruent  Thought Process:  Coherent and Linear  Orientation:  Full (Time, Place, and Person)  Thought Content:  Logical  Suicidal Thoughts:  No  Homicidal Thoughts:  No  Memory:  Immediate;   Fair Recent;   Fair Remote;   Fair  Judgement:  Intact  Insight:  Shallow  Psychomotor Activity:  Normal  Concentration:  Fair  Recall:  [employer] of Knowledge:Fair  Language: Fair  Akathisia:  Negative  Handed:  Right  AIMS (if indicated):     Assets:  Communication Skills Desire for Improvement Housing Physical Health Resilience Social Support Talents/Skills Transportation Vocational/Educational  Sleep:  Number of Hours: 8.5  Cognition: WNL  ADL's:  Intact     Physical Exam: Physical Exam Vitals and nursing note reviewed.  Constitutional:      Appearance: Normal appearance.  HENT:     Head: Normocephalic and atraumatic.     Right Ear: External ear normal.     Left Ear: External ear normal.     Nose: Nose normal.     Mouth/Throat:     Mouth: Mucous membranes are moist.     Pharynx: Oropharynx is clear.  Eyes:     Conjunctiva/sclera: Conjunctivae normal.     Pupils: Pupils are equal, round, and reactive to light.  Cardiovascular:     Rate and Rhythm: Normal rate.     Pulses: Normal pulses.  Pulmonary:     Effort: Pulmonary effort is normal.     Breath sounds: Normal breath sounds.  Abdominal:     General: Abdomen is flat.     Palpations: Abdomen is soft.  Musculoskeletal:        General: No swelling. Normal range of motion.     Cervical back: Normal range of motion and neck supple.  Skin:     General: Skin is warm and dry.  Neurological:     General: No focal deficit present.     Mental Status: He is alert and oriented to person, place, and time.  Psychiatric:        Mood and Affect: Mood normal.        Behavior: Behavior normal.        Thought Content: Thought content normal.        Judgment: Judgment normal.   Review of Systems  Constitutional: Negative for appetite change and fatigue.  HENT: Negative for rhinorrhea and sore throat.   Eyes: Negative for photophobia and visual disturbance.  Respiratory: Negative for cough and shortness of breath.   Cardiovascular: Negative for chest pain and palpitations.  Gastrointestinal: Negative for nausea and vomiting.  Endocrine: Negative for cold intolerance and  heat intolerance.  Genitourinary: Negative for difficulty urinating and dysuria.  Musculoskeletal: Negative for arthralgias and myalgias.  Skin: Negative for rash and wound.  Allergic/Immunologic: Negative for environmental allergies and food allergies.  Neurological: Negative for dizziness and headaches.  Hematological: Negative for adenopathy. Does not bruise/bleed easily.  Psychiatric/Behavioral: Negative for dysphoric mood, hallucinations, sleep disturbance and suicidal ideas.  Blood pressure (!) 144/89, pulse 62, temperature 98.2 F (36.8 C), temperature source Oral, resp. rate 18, height 5\' 4"  (1.626 m), weight 73 kg, SpO2 100 %. Body mass index is 27.64 kg/m.   Have you used any form of tobacco in the last 30 days? (Cigarettes, Smokeless Tobacco, Cigars, and/or Pipes): Yes  Has this patient used any form of tobacco in the last 30 days? (Cigarettes, Smokeless Tobacco, Cigars, and/or Pipes)  Yes, A prescription for an FDA-approved tobacco cessation medication was offered at discharge and the patient refused  Blood Alcohol level:  Lab Results  Component Value Date   ETH 143 (H) 04/01/2020   ETH 188 (H) 03/28/2020    Metabolic Disorder Labs:  No results found  for: HGBA1C, MPG No results found for: PROLACTIN No results found for: CHOL, TRIG, HDL, CHOLHDL, VLDL, LDLCALC  See Psychiatric Specialty Exam and Suicide Risk Assessment completed by Attending Physician prior to discharge.  Discharge destination:  Friend's home  Is patient on multiple antipsychotic therapies at discharge:  No   Has Patient had three or more failed trials of antipsychotic monotherapy by history:  No  Recommended Plan for Multiple Antipsychotic Therapies: NA  Discharge Instructions    Diet - low sodium heart healthy   Complete by: As directed    Increase activity slowly   Complete by: As directed      Allergies as of 04/07/2020   No Known Allergies     Medication List    TAKE these medications     Indication  buPROPion 150 MG 24 hr tablet Commonly known as: WELLBUTRIN XL Take 1 tablet (150 mg total) by mouth daily. Start taking on: April 08, 2020  Indication: Attention Deficit Hyperactivity Disorder, Major Depressive Disorder   hydrOXYzine 50 MG tablet Commonly known as: ATARAX/VISTARIL Take 1 tablet (50 mg total) by mouth 3 (three) times daily as needed for anxiety.  Indication: Feeling Anxious   lisinopril 5 MG tablet Commonly known as: ZESTRIL Take 1 tablet (5 mg total) by mouth daily. Start taking on: April 08, 2020  Indication: High Blood Pressure Disorder   naltrexone 50 MG tablet Commonly known as: DEPADE Take 1 tablet (50 mg total) by mouth daily. Start taking on: April 08, 2020  Indication: Abuse or Misuse of Alcohol   traZODone 100 MG tablet Commonly known as: DESYREL Take 1 tablet (100 mg total) by mouth at bedtime as needed for sleep.  Indication: Trouble Sleeping       Follow-up Information    April 10, [facility] Follow up.   Why: Your appointment is scheduled for Thursday, 04/10/20 at 9:00 with [facility]. This is an assessment over the phone. Thanks! Contact information: [address] [place]  [place] [phone]               Follow-up recommendations:  Activity:  as tolerated Diet:  low sodium heart healthy diet  Comments:  7-day supply of free medications provided to patient at discharge along with printed 30-day scripts with 1 refill   Signed: [phone], MD 04/07/2020, 9:34 AM

## 2020-04-07 NOTE — [facility] Group Notes (Signed)
LCSW Group Therapy Note   04/07/2020 1:57 PM  Type of Therapy and Topic:  Group Therapy:  Overcoming Obstacles   Participation Level:  Active   Description of Group:    In this group patients will be encouraged to explore what they see as obstacles to their own wellness and recovery. They will be guided to discuss their thoughts, feelings, and behaviors related to these obstacles. The group will process together ways to cope with barriers, with attention given to specific choices patients can make. Each patient will be challenged to identify changes they are motivated to make in order to overcome their obstacles. This group will be process-oriented, with patients participating in exploration of their own experiences as well as giving and receiving support and challenge from other group members.   Therapeutic Goals: 1. Patient will identify personal and current obstacles as they relate to admission. 2. Patient will identify barriers that currently interfere with their wellness or overcoming obstacles.  3. Patient will identify feelings, thought process and behaviors related to these barriers. 4. Patient will identify two changes they are willing to make to overcome these obstacles:      Summary of Patient Progress Patient was present for the entirety of group. He was able to speak about his issues around family, including not being able to see his daughter and an ill grandfather which became overwhelming for him. He spoke about refocusing on his goal of getting his own place so that he can eventually have visitation with his daughter.    Therapeutic Modalities:   Cognitive Behavioral Therapy Solution Focused Therapy Motivational Interviewing Relapse Prevention Therapy  Simona Huh R. Algis Greenhouse, MSW, LCSW, LCAS 04/07/2020 1:57 PM

## 2020-04-07 NOTE — Progress Notes (Signed)
Recreation Therapy Notes  Date: 04/07/2020  Time: 9:30 am   Location: Craft room   Behavioral response: Appropriate  Intervention Topic: Relaxation   Discussion/Intervention:  Group content today was focused on relaxation. The group defined relaxation and identified healthy ways to relax. Individuals expressed how much time they spend relaxing. Patients expressed how much their life would be if they did not make time for themselves to relax. The group stated ways they could improve their relaxation techniques in the future.  Individuals participated in the intervention "Time to Relax" where they had a chance to experience different relaxation techniques.  Clinical Observations/Feedback: Patient came to group and defined relaxation as sleeping. He stated that he likes to play pool and watch movies to relax. Participant expressed that staying busy is what helps him relax. Individual was social with staff and peers while participating in the intervention. Desiree Daise LRT/CTRS         Heydi Swango 04/07/2020 12:29 PM

## 2020-06-24 ENCOUNTER — Other Ambulatory Visit: Payer: Self-pay

## 2020-06-24 MED ORDER — HYDROXYZINE HCL 50 MG PO TABS
50.0000 mg | ORAL_TABLET | Freq: Two times a day (BID) | ORAL | 1 refills | Status: DC | PRN
  Filled 2020-06-24: qty 60, 30d supply, fill #0

## 2020-06-24 MED ORDER — BUPROPION HCL ER (XL) 300 MG PO TB24
300.0000 mg | ORAL_TABLET | Freq: Every morning | ORAL | 1 refills | Status: DC
  Filled 2020-06-24: qty 30, 30d supply, fill #0

## 2020-06-24 MED ORDER — TRAZODONE HCL 100 MG PO TABS
100.0000 mg | ORAL_TABLET | Freq: Every evening | ORAL | 1 refills | Status: DC | PRN
  Filled 2020-06-24: qty 30, 30d supply, fill #0

## 2020-07-10 ENCOUNTER — Emergency Department
Admission: EM | Admit: 2020-07-10 | Discharge: 2020-07-12 | Disposition: A | Discharge status: Home / Self Care | Payer: Self-pay | Attending: Emergency Medicine | Admitting: Emergency Medicine

## 2020-07-10 ENCOUNTER — Other Ambulatory Visit: Payer: Self-pay

## 2020-07-10 DIAGNOSIS — R059 Cough, unspecified: Secondary | ICD-10-CM | POA: Insufficient documentation

## 2020-07-10 DIAGNOSIS — F1020 Alcohol dependence, uncomplicated: Secondary | ICD-10-CM | POA: Diagnosis present

## 2020-07-10 DIAGNOSIS — Y908 Blood alcohol level of 240 mg/100 ml or more: Secondary | ICD-10-CM | POA: Insufficient documentation

## 2020-07-10 DIAGNOSIS — Z79899 Other long term (current) drug therapy: Secondary | ICD-10-CM | POA: Insufficient documentation

## 2020-07-10 DIAGNOSIS — F332 Major depressive disorder, recurrent severe without psychotic features: Secondary | ICD-10-CM | POA: Diagnosis present

## 2020-07-10 DIAGNOSIS — Z20822 Contact with and (suspected) exposure to covid-19: Secondary | ICD-10-CM | POA: Insufficient documentation

## 2020-07-10 DIAGNOSIS — F17210 Nicotine dependence, cigarettes, uncomplicated: Secondary | ICD-10-CM | POA: Insufficient documentation

## 2020-07-10 DIAGNOSIS — T1491XA Suicide attempt, initial encounter: Secondary | ICD-10-CM | POA: Diagnosis present

## 2020-07-10 DIAGNOSIS — F988 Other specified behavioral and emotional disorders with onset usually occurring in childhood and adolescence: Secondary | ICD-10-CM | POA: Diagnosis present

## 2020-07-10 DIAGNOSIS — F341 Dysthymic disorder: Secondary | ICD-10-CM | POA: Diagnosis present

## 2020-07-10 DIAGNOSIS — R45851 Suicidal ideations: Secondary | ICD-10-CM | POA: Insufficient documentation

## 2020-07-10 DIAGNOSIS — F1010 Alcohol abuse, uncomplicated: Secondary | ICD-10-CM | POA: Diagnosis present

## 2020-07-10 LAB — URINALYSIS, COMPLETE (UACMP) WITH MICROSCOPIC
Bacteria, UA: NONE SEEN
Bilirubin Urine: NEGATIVE
Glucose, UA: NEGATIVE mg/dL
Hgb urine dipstick: NEGATIVE
Ketones, ur: NEGATIVE mg/dL
Leukocytes,Ua: NEGATIVE
Nitrite: NEGATIVE
Protein, ur: NEGATIVE mg/dL
Specific Gravity, Urine: 1.005 (ref 1.005–1.030)
pH: 5 (ref 5.0–8.0)

## 2020-07-10 LAB — CBC
HCT: 45.7 % (ref 39.0–52.0)
Hemoglobin: 15.3 g/dL (ref 13.0–17.0)
MCH: 30.8 pg (ref 26.0–34.0)
MCHC: 33.5 g/dL (ref 30.0–36.0)
MCV: 92 fL (ref 80.0–100.0)
Platelets: 229 10*3/uL (ref 150–400)
RBC: 4.97 MIL/uL (ref 4.22–5.81)
RDW: 12.5 % (ref 11.5–15.5)
WBC: 7.4 10*3/uL (ref 4.0–10.5)
nRBC: 0 % (ref 0.0–0.2)

## 2020-07-10 LAB — COMPREHENSIVE METABOLIC PANEL
ALT: 22 U/L (ref 0–44)
AST: 19 U/L (ref 15–41)
Albumin: 4.7 g/dL (ref 3.5–5.0)
Alkaline Phosphatase: 58 U/L (ref 38–126)
Anion gap: 9 (ref 5–15)
BUN: 12 mg/dL (ref 6–20)
CO2: 23 mmol/L (ref 22–32)
Calcium: 9.1 mg/dL (ref 8.9–10.3)
Chloride: 105 mmol/L (ref 98–111)
Creatinine, Ser: 1.02 mg/dL (ref 0.61–1.24)
GFR, Estimated: >60 mL/min (ref >60)
Glucose, Bld: 110 mg/dL — ABNORMAL HIGH (ref 70–99)
Potassium: 4.2 mmol/L (ref 3.5–5.1)
Sodium: 137 mmol/L (ref 135–145)
Total Bilirubin: 0.7 mg/dL (ref 0.3–1.2)
Total Protein: 7.8 g/dL (ref 6.5–8.1)

## 2020-07-10 LAB — URINE DRUG SCREEN, QUALITATIVE (ARMC ONLY)
Amphetamines, Ur Screen: NOT DETECTED
Barbiturates, Ur Screen: NOT DETECTED
Benzodiazepine, Ur Scrn: NOT DETECTED
Cannabinoid 50 Ng, Ur ~~LOC~~: NOT DETECTED
Cocaine Metabolite,Ur ~~LOC~~: NOT DETECTED
MDMA (Ecstasy)Ur Screen: NOT DETECTED
Methadone Scn, Ur: NOT DETECTED
Opiate, Ur Screen: NOT DETECTED
Phencyclidine (PCP) Ur S: NOT DETECTED
Tricyclic, Ur Screen: NOT DETECTED

## 2020-07-10 LAB — RESP PANEL BY RT-PCR (FLU A&B, COVID) ARPGX2
Influenza A by PCR: NEGATIVE
Influenza B by PCR: NEGATIVE
SARS Coronavirus 2 by RT PCR: NEGATIVE

## 2020-07-10 LAB — ETHANOL: Alcohol, Ethyl (B): 251 mg/dL — ABNORMAL HIGH (ref <10)

## 2020-07-10 MED ORDER — LORAZEPAM 2 MG/ML IJ SOLN
2.0000 mg | Freq: Once | INTRAMUSCULAR | Status: AC
  Administered 2020-07-10: 2 mg via INTRAMUSCULAR
  Filled 2020-07-10: qty 1

## 2020-07-10 MED ORDER — HALOPERIDOL LACTATE 5 MG/ML IJ SOLN
5.0000 mg | Freq: Once | INTRAMUSCULAR | Status: AC
  Administered 2020-07-10: 5 mg via INTRAMUSCULAR
  Filled 2020-07-10: qty 1

## 2020-07-10 NOTE — BH Assessment (Signed)
Comprehensive Clinical Assessment (CCA) Note  07/10/2020 Joshua BlightMichael [name] [id number]  Chief Complaint: Patient is a 35 year old male presenting to [facility] ED initially voluntary but has since been IVC'd. Per triage note [facility], pt is voluntary. During assessment patient appeared alert and oriented x4, cooperative, somewhat irritable and under the influence of substances, patient speech was slurred during the assessment. Patient reports "I want to kill myself." Patient reports the reason he wants to hurt himself "you imagine not seeing your daughter, she is about a year and a half old, the mom is a piece of shit." Patient reports that he went to [facility] to seek out assistance for his substance use but believes that nobody wants to help him. Patient reports SI with no plan, he does report that he has tried to hurt himself in the past "I tried to hang myself before" and reports being hospitalized in March of this year. Patient was admitted to [facility] in March of this year. Patient reports drinking alcohol today "3 beers that were 80% and some Budlight." Patient reports that he has been drinking "since Elementary or Middle school, it got worse when I graduated high school." Patient also reports using Cocaine, he reports his last use being last Monday. Patient reports SI, denies current HI/AH/VH and does not appear to be responding to any internal or external stimuli.  Per Psyc NP Elenore PaddyJackie Thompson patient will be observed overnight and reassessed  Chief Complaint  Patient presents with   Suicidal   Addiction Problem   Visit Diagnosis: Alcohol Use Disorder, Severe. Depression    CCA Screening, Triage and Referral (STR)  Patient Reported Information How did you hear about us? Other (Comment)  Referral name: Self  Referral phone number: [phone]   Whom do you see for routine medical problems? I don't have a doctor  Practice/Facility Name: No data  recorded Practice/Facility Phone Number: No data recorded Name of Contact: No data recorded Contact Number: No data recorded Contact Fax Number: No data recorded Prescriber Name: No data recorded Prescriber Address (if known): No data recorded  What Is the Reason for Your Visit/Call Today? Patient is presenting via [facility] due to alcohol abuse and SI  How Long Has This Been Causing You Problems? > than 6 months  What Do You Feel Would Help You the Most Today? Alcohol or Drug Use Treatment; Treatment for Depression or other mood problem   Have You Recently Been in Any Inpatient Treatment (Hospital/Detox/Crisis Center/28-Day Program)? No  Name/Location of Program/Hospital:No data recorded How Long Were You There? No data recorded When Were You Discharged? No data recorded  Have You [facility] Re[facility] Services [facility] Before? No  Who Do You See at [facility]? No data recorded  Have You Recently Had Any Thoughts About Hurting Yourself? Yes  Are You Planning to Commit Suicide/Harm Yourself At This time? No   Have you Recently Had Thoughts About Hurting Someone Karolee Ohslse? No  Explanation: No data recorded  Have You Used Any Alcohol or Drugs in the Past 24 Hours? Yes  How Long Ago Did You Use Drugs or Alcohol? No data recorded What Did You Use and How Much? Alcohol   Do You Currently Have a Therapist/Psychiatrist? No  Name of Therapist/Psychiatrist: No data recorded  Have You Been Recently Discharged From Any Office Practice or Programs? No  Explanation of Discharge From Practice/Program: No data recorded    CCA Screening Triage Referral Assessment Type of Contact: Face-to-Face  Is this Initial or Reassessment? No data recorded Date Telepsych consult ordered in [facility]:  No data recorded Time Telepsych consult ordered in [facility]:  No data recorded  Patient Reported Information Reviewed? Yes  Patient Left Without Being Seen? No data recorded Reason for Not Completing  Assessment: No data recorded  Collateral Involvement: No data recorded  Does Patient Have a Court Appointed Legal Guardian? No data recorded Name and Contact of Legal Guardian: No data recorded If Minor and Not Living with Parent(s), Who has Custody? n/a  Is CPS involved or ever been involved? Never  Is APS involved or ever been involved? Never   Patient Determined To Be At Risk for Harm To Self or Others Based on Review of Patient Reported Information or Presenting Complaint? No  Method: No data recorded Availability of Means: No data recorded Intent: No data recorded Notification Required: No data recorded Additional Information for Danger to Others Potential: No data recorded Additional Comments for Danger to Others Potential: No data recorded Are There Guns or Other Weapons in Your Home? No data recorded Types of Guns/Weapons: No data recorded Are These Weapons Safely Secured?                            No data recorded Who Could Verify You Are Able To Have These Secured: No data recorded Do You Have any Outstanding Charges, Pending Court Dates, Parole/Probation? No data recorded Contacted To Inform of Risk of Harm To Self or Others: No data recorded  Location of Assessment: [facility] ED   Does Patient Present under Involuntary Commitment? Yes  IVC Papers Initial File Date: 07/10/20   [place] of Residence: [place]   Patient Currently Receiving the Following Services: Not Receiving Services   Determination of Need: Emergent (2 hours)   Options For Referral: Other: Comment (Per Annice Pih, T, NP pt is recommended for overnight obs and reassessment in the AM)     CCA Biopsychosocial Intake/Chief Complaint:  Psych Eval  Current Symptoms/Problems: Depression   Patient Reported Schizophrenia/Schizoaffective Diagnosis in Past: No   Strengths: Patient is able to communicate his needs  Preferences: None noted  Abilities: Able to take care of self   Type of Services  Patient Feels are Needed: Treatment   Initial Clinical Notes/Concerns: Pt was just discharge with a recommendation to follow up with [facility]; pt reported that he didn't like [facility] as they wouldn't listen to him.   Mental Health Symptoms Depression:   Change in energy/activity; Fatigue; Hopelessness; Irritability; Worthlessness   Duration of Depressive symptoms:  Greater than two weeks   Mania:   None   Anxiety:    Tension; Worrying; Irritability   Psychosis:   None   Duration of Psychotic symptoms: No data recorded  Trauma:   None   Obsessions:   None   Compulsions:   None   Inattention:   None   Hyperactivity/Impulsivity:   None   Oppositional/Defiant Behaviors:   None   Emotional Irregularity:   None   Other Mood/Personality Symptoms:  No data recorded   Mental Status Exam Appearance and self-care  Stature:   Average   Weight:   Average weight   Clothing:   Disheveled   Grooming:   Neglected   Cosmetic use:   None   Posture/gait:   Normal   Motor activity:   Not Remarkable   Sensorium  Attention:   Normal   Concentration:   Normal   Orientation:  X5   Recall/memory:   Normal   Affect and Mood  Affect:   Appropriate   Mood:   Irritable   Relating  Eye contact:   Normal   Facial expression:   Responsive   Attitude toward examiner:   Cooperative   Thought and Language  Speech flow:  Slurred   Thought content:   Appropriate to Mood and Circumstances   Preoccupation:   None   Hallucinations:   None   Organization:  No data recorded  [employer] of Knowledge:   Fair   Intelligence:   Average   Abstraction:   Functional   Judgement:   Poor   Reality Testing:   Realistic   Insight:   Poor; Lacking   Decision Making:   Impulsive   Social Functioning  Social Maturity:   Irresponsible   Social Judgement:   Normal   Stress  Stressors:   Other (Comment); Relationship    Coping Ability:   Exhausted   Skill Deficits:   None   Supports:   Support needed     Religion: Religion/Spirituality Are You A Religious Person?: No  Leisure/Recreation: Leisure / Recreation Do You Have Hobbies?: No  Exercise/Diet: Exercise/Diet Do You Exercise?: No Have You Gained or Lost A Significant Amount of Weight in the Past Six Months?: No Do You Follow a Special Diet?: No Do You Have Any Trouble Sleeping?: No   CCA Employment/Education Employment/Work Situation: Employment / Work Situation Employment Situation: Unemployed Has Patient ever Been in Equities trader?: No  Education: Education Is Patient Currently Attending School?: No Did You Have An Individualized Education Program (IIEP): No Did You Have Any Difficulty At [employer]?: No Patient's Education Has Been Impacted by Current Illness: No   CCA Family/Childhood History Family and Relationship History: Family history Marital status: Single Does patient have children?: Yes How many children?: 1 How is patient's relationship with their children?: Patient is currently unable to see his daughter  Childhood History:  Childhood History Did patient suffer any verbal/emotional/physical/sexual abuse as a child?: No Did patient suffer from severe childhood neglect?: No Has patient ever been sexually abused/assaulted/raped as an adolescent or adult?: No Was the patient ever a victim of a crime or a disaster?: No Witnessed domestic violence?: No Has patient been affected by domestic violence as an adult?: No  Child/Adolescent Assessment:     CCA Substance Use Alcohol/Drug Use: Alcohol / Drug Use Pain Medications: See MAR Prescriptions: See MAR Over the Counter: See MAR History of alcohol / drug use?: Yes Withdrawal Symptoms: Agitation, Change in blood pressure Substance #1 Name of Substance 1: Alcohol 1 - Age of First Use: "Elementary or Middle School aged" 1 - Amount (size/oz): "3 beers and  Budlight" 1 - Frequency: "if I can daily" 1 - Last Use / Amount: 07/10/20                       ASAM's:  Six Dimensions of Multidimensional Assessment  Dimension 1:  Acute Intoxication and/or Withdrawal Potential:   Dimension 1:  Description of individual's past and current experiences of substance use and withdrawal: No current withdrawal experienced currently  Dimension 2:  Biomedical Conditions and Complications:   Dimension 2:  Description of patient's biomedical conditions and  complications: Patient has high blood pressure  Dimension 3:  Emotional, Behavioral, or Cognitive Conditions and Complications:  Dimension 3:  Description of emotional, behavioral, or cognitive conditions and complications: Patient is currently having emotional  issues with the custody of his daughter  Dimension 4:  Readiness to Change:  Dimension 4:  Description of Readiness to Change criteria: Patient unsure of his willingness to change  Dimension 5:  Relapse, Continued use, or Continued Problem Potential:  Dimension 5:  Relapse, continued use, or continued problem potential critiera description: Patient has a high potential for relapse  Dimension 6:  Recovery/Living Environment:  Dimension 6:  Recovery/Iiving environment criteria description: Unhealthy living environment  ASAM Severity Score: ASAM's Severity Rating Score: 13  ASAM Recommended Level of Treatment: ASAM Recommended Level of Treatment: Level II Intensive Outpatient Treatment   Substance use Disorder (SUD) Substance Use Disorder (SUD)  Checklist Symptoms of Substance Use: Continued use despite having a persistent/recurrent physical/psychological problem caused/exacerbated by use, Evidence of tolerance, Presence of craving or strong urge to use, Social, occupational, recreational activities given up or reduced due to use, Recurrent use that results in a failure to fulfill major role obligations (work, school, home), Substance(s) often taken in  larger amounts or over longer times than was intended, Large amounts of time spent to obtain, use or recover from the substance(s), Continued use despite persistent or recurrent social, interpersonal problems, caused or exacerbated by use, Persistent desire or unsuccessful efforts to cut down or control use, Repeated use in physically hazardous situations  Recommendations for Services/Supports/Treatments:  Per Psyc NP Elenore Paddy patient will be observed overnight and reassessed   DSM5 Diagnoses: Patient Active Problem List   Diagnosis Date Noted   Alcohol use disorder, severe, dependence (HCC) 04/03/2020   Severe recurrent major depression without psychotic features (HCC) 04/02/2020   MDD (major depressive disorder), recurrent episode, severe (HCC) 03/28/2020   Alcohol abuse 03/28/2020   Suicide attempt (HCC) 03/28/2020   ADD (attention deficit disorder) 10/21/2015   Dysthymia 10/21/2015    Patient Centered Plan: Patient is on the following Treatment Plan(s):  Depression and Substance Abuse   Referrals to Alternative Service(s): Referred to Alternative Service(s):   Place:   Date:   Time:    Referred to Alternative Service(s):   Place:   Date:   Time:    Referred to Alternative Service(s):   Place:   Date:   Time:    Referred to Alternative Service(s):   Place:   Date:   Time:     Oneika Simonian A Laquinton Bihm, LCAS-A

## 2020-07-10 NOTE — ED Provider Notes (Addendum)
[facility] Emergency Department Provider Note  ____________________________________________   Event Date/Time   First MD Initiated Contact with Patient 07/10/20 2131     (approximate)  I have reviewed the triage vital signs and the nursing notes.   HISTORY  Chief Complaint Suicidal    HPI Joshua [name] is a 35 y.o. male depression who comes in with SI.  Patient comes in with concerns for SI, HI.  When asked patient he had been drinking tonight he laughed.  I asked if he had been having thoughts of hurting himself but he states "I tried to hang myself back in March."  I asked him if he had a plan and he stated "good thing I do not have a gun".  Unclear how long these thoughts have been going on, severe, constant, nothing makes them better, nothing makes them worse.  Reports a mild intermittent cough but denies any other medical concern          Past Medical History:  Diagnosis Date   ADD (attention deficit disorder)    Depression    Paranoid [facility])     Patient Active Problem List   Diagnosis Date Noted   Alcohol use disorder, severe, dependence (HCC) 04/03/2020   Severe recurrent major depression without psychotic features (HCC) 04/02/2020   MDD (major depressive disorder), recurrent episode, severe (HCC) 03/28/2020   Alcohol abuse 03/28/2020   Suicide attempt (HCC) 03/28/2020   ADD (attention deficit disorder) 10/21/2015   Dysthymia 10/21/2015    Past Surgical History:  Procedure Laterality Date   HERNIA REPAIR      Prior to Admission medications   Medication Sig Start Date End Date Taking? Authorizing Provider  buPROPion (WELLBUTRIN XL) 150 MG 24 hr tablet Take 1 tablet (150 mg total) by mouth daily. 04/08/20   Jesse Sans, MD  buPROPion (WELLBUTRIN XL) 150 MG 24 hr tablet TAKE 1 TABLET (150 MG TOTAL) BY MOUTH DAILY. 04/07/20 04/07/21  Jesse Sans, MD  buPROPion (WELLBUTRIN XL) 300 MG 24 hr tablet Take 1 tablet (300 mg total) by  mouth in the morning. 06/24/20     hydrOXYzine (ATARAX/VISTARIL) 50 MG tablet Take 1 tablet (50 mg total) by mouth 3 (three) times daily as needed for anxiety. 04/07/20   Jesse Sans, MD  hydrOXYzine (ATARAX/VISTARIL) 50 MG tablet Take 1 tablet (50 mg total) by mouth 2 (two) times daily as needed. 06/24/20     lisinopril (ZESTRIL) 5 MG tablet Take 1 tablet (5 mg total) by mouth daily. 04/08/20   Jesse Sans, MD  lisinopril (ZESTRIL) 5 MG tablet TAKE 1 TABLET (5 MG TOTAL) BY MOUTH DAILY. 04/07/20 04/07/21  Jesse Sans, MD  naltrexone (DEPADE) 50 MG tablet Take 1 tablet (50 mg total) by mouth daily. 04/08/20   Jesse Sans, MD  naltrexone (DEPADE) 50 MG tablet TAKE 1 TABLET (50 MG TOTAL) BY MOUTH DAILY. 04/07/20 04/07/21  Jesse Sans, MD  traZODone (DESYREL) 100 MG tablet Take 1 tablet (100 mg total) by mouth at bedtime as needed for sleep. 04/07/20   Jesse Sans, MD  traZODone (DESYREL) 100 MG tablet Take 1 tablet (100 mg total) by mouth at bedtime as needed. 06/24/20     traZODone (DESYREL) 100 MG tablet TAKE 1 TABLET (100 MG TOTAL) BY MOUTH AT BEDTIME AS NEEDED FOR SLEEP. 04/07/20 04/07/21  Jesse Sans, MD    Allergies Patient has no known allergies.  No family history on file.  Social  History Social History   Tobacco Use   Smoking status: Every Day    Packs/day: 0.50    Pack years: 0.00    Types: Cigarettes   Smokeless tobacco: Never  Substance Use Topics   Alcohol use: Yes    Comment: occassional 24oz beer at night   Drug use: No      Review of Systems Constitutional: No fever/chills Eyes: No visual changes. ENT: No sore throat. Cardiovascular: Denies chest pain. Respiratory: Denies shortness of breath.  Cough Gastrointestinal: No abdominal pain.  No nausea, no vomiting.  No diarrhea.  No constipation. Genitourinary: Negative for dysuria. Musculoskeletal: Negative for back pain. Skin: Negative for rash. Neurological: Negative for headaches, focal  weakness or numbness. Psych: SI, HI All other ROS negative ____________________________________________   PHYSICAL EXAM:  VITAL SIGNS: ED Triage Vitals  Enc Vitals Group     BP 07/10/20 2121 (!) 126/102     Pulse Rate 07/10/20 2121 84     Resp 07/10/20 2121 20     Temp 07/10/20 2121 98.3 F (36.8 C)     Temp Source 07/10/20 2121 Oral     SpO2 07/10/20 2121 96 %     Weight 07/10/20 2122 160 lb (72.6 kg)     Height 07/10/20 2122 5\' 4"  (1.626 m)     Head Circumference --      Peak Flow --      Pain Score 07/10/20 2121 0     Pain Loc --      Pain Edu? --      Excl. in GC? --     Constitutional: Alert and oriented. Well appearing and in no acute distress. Eyes: Conjunctivae are normal. No swelling around eyes Head: Atraumatic. Nose: No congestion/rhinnorhea. Mouth/Throat: Mucous membranes are moist.   Neck: No stridor. Trachea Midline. FROM Cardiovascular: Normal rate, no swelling noted Respiratory: No increased wob, no stridor Gastrointestinal: Soft and nontender. No distention. No abdominal bruits.  Musculoskeletal: No lower extremity tenderness nor edema.  No joint effusions. Neurologic:  Normal speech and language. No gross focal neurologic deficits are appreciated.  Skin:  Skin is warm, dry and intact. No rash noted. Psychiatric: Positive SI, HI GU: Deferred   ____________________________________________   LABS (all labs ordered are listed, but only abnormal results are displayed)  Labs Reviewed  CBC  COMPREHENSIVE METABOLIC PANEL  ETHANOL  URINALYSIS, COMPLETE (UACMP) WITH MICROSCOPIC  URINE DRUG SCREEN, QUALITATIVE ([facility] ONLY)   ____________________________________________   PROCEDURES  Procedure(s) performed (including Critical Care):  Procedures   ____________________________________________   INITIAL IMPRESSION / ASSESSMENT AND PLAN / ED COURSE  Joshua [name] was evaluated in Emergency Department on 07/10/2020 for the symptoms described in  the history of present illness. He was evaluated in the context of the global COVID-19 pandemic, which necessitated consideration that the patient might be at risk for infection with the SARS-CoV-2 virus that causes COVID-19. Institutional protocols and algorithms that pertain to the evaluation of patients at risk for COVID-19 are in a state of rapid change based on information released by regulatory bodies including the CDC and federal and state organizations. These policies and algorithms were followed during the patient's care in the ED.    Pt is without any acute medical complaints.  Patient reports a mild cough but patient's not hypoxic and lung sounds clear unlikely pneumonia.  Will get COVID swab.  No exam findings to suggest medical cause of current presentation. Will order psychiatric screening labs and discuss further w/ psychiatric service.  Patient came in voluntary and was already seen by psychiatry who just feels that patient is most likely having these thoughts doing to being drunk.  Patient tries to leave patient will need reassessment to see if he is still having SI or if he is now sober.   D/d includes but is not limited to psychiatric disease, behavioral/personality disorder, inadequate socioeconomic support, medical.  Based on HPI, exam, unremarkable labs, no concern for acute medical problem at this time. No rigidity, clonus, hyperthermia, focal neurologic deficit, diaphoresis, tachycardia, meningismus, ataxia, gait abnormality or other finding to suggest this visit represents a non-psychiatric problem. Screening labs reviewed.    Given this, pt medically cleared, to be dispositioned per Psych.    The patient has been placed in psychiatric observation due to the need to provide a safe environment for the patient while obtaining psychiatric consultation and evaluation, as well as ongoing medical and medication management to treat the patient's condition.  The patient has not been  placed under full IVC at this time.   10:57 PM Patient attempted to walk out of the emergency room but unfortunately he is still intoxicated therefore patient was placed under IVC       ____________________________________________   FINAL CLINICAL IMPRESSION(S) / ED DIAGNOSES   Final diagnoses:  Suicidal ideation  ETOH abuse      MEDICATIONS GIVEN DURING THIS VISIT:  Medications - No data to display   ED Discharge Orders     None        Note:  This document was prepared using Dragon voice recognition software and may include unintentional dictation errors.    Concha Se, MD 07/10/20 2247    Concha Se, MD 07/10/20 [phone]

## 2020-07-10 NOTE — ED Notes (Signed)
Black boots White socks Green tshirt Jeans All locked in [employer] area

## 2020-07-10 NOTE — ED Notes (Signed)
ED Provider at bedside. 

## 2020-07-10 NOTE — ED Triage Notes (Signed)
Pt brought by [facility] for suicidal thoughts, pt is voluntary.

## 2020-07-11 NOTE — ED Notes (Signed)
Rescinded by Celso Amy NP moved to [facility] 2

## 2020-07-11 NOTE — ED Notes (Signed)
Pt has woken up and is eating lunch tray.

## 2020-07-11 NOTE — ED Notes (Signed)
Pt given dinner, awake and eating at this time.

## 2020-07-11 NOTE — ED Provider Notes (Signed)
Emergency Medicine Observation Re-evaluation Note  Joshua [name] is a 35 y.o. male, seen on rounds today.  Pt initially presented to the ED for complaints of Suicidal and Addiction Problem Currently, the patient is resting, voices no medical complaints.  Physical Exam  BP (!) 126/102 (BP Location: Left Arm)   Pulse 84   Temp 98.3 F (36.8 C) (Oral)   Resp 20   Ht 5\' 4"  (1.626 m)   Wt 72.6 kg   SpO2 96%   BMI 27.46 kg/m  Physical Exam General: Resting in no acute distress Cardiac: No cyanosis Lungs: Equal rise and fall Psych: Not agitated  ED Course / MDM  EKG:   I have reviewed the labs performed to date as well as medications administered while in observation.  Recent changes in the last 24 hours include no events overnight.  Plan  Current plan is for psychiatric disposition. Patient is under full IVC at this time.   , MD 07/11/20 [phone]

## 2020-07-11 NOTE — BH Assessment (Addendum)
Writer spoke with the patient to complete an updated/reassessment. Patient denies SI/HI and AV/H.  Writer spoke with [facility] for possible admission for tomorrow (07/12/2020). They advised to call back tomorrow and check on bed availability. If they have a bed, he will need to have transportation to get there, as well as complete the 24 hour timeframe of him not on IVC.

## 2020-07-11 NOTE — ED Notes (Signed)
VOL  PENDING  PLACEMENT 

## 2020-07-11 NOTE — Consult Note (Signed)
[facility] Face-to-Face Psychiatry Consult   Reason for Consult: Suicidal Referring Physician: Dr. Fuller Plan Patient Identification: Joshua [name] MRN:  [id number] Principal Diagnosis: <principal problem not specified> Diagnosis:  Active Problems:   ADD (attention deficit disorder)   Dysthymia   MDD (major depressive disorder), recurrent episode, severe (HCC)   Alcohol abuse   Suicide attempt (HCC)   Severe recurrent major depression without psychotic features (HCC)   Alcohol use disorder, severe, dependence (HCC)   Total Time spent with patient: 30 minutes  Subjective: "I am trying to get into a substance abuse rehab program, but no one will help me." Joshua [name] is a 35 y.o. male patient presented to [facility] ED was initially voluntary but has since been placed under involuntary commitment status (IVC). The patient expressed, "I want to kill myself." The patient stated, "you imagine not seeing your daughter; she is about a year and a half old, and the mom is a piece of shit." The patient shared that he went to [facility] to seek assistance for his substance use but believes that nobody wants to help him. The patient was admitted to the [facility] inpatient unit in March 2022. He acknowledged drinking alcohol today "3 beers that were 80% and some Bud Light." The patient expressed that he has been drinking "since Elementary or Middle school; it got worse when I graduated high school." The patient discussed using Cocaine; he reports his last use being last Monday. His BAL is 251 mg/dl and negative for illicit substances. The patient was seen face-to-face by this provider; the chart was reviewed and consulted with Dr. Fuller Plan on 07/10/2020 due to the patient's care. It was discussed with the EDP that the patient remained under observation overnight and will be reassessed in the a.m. to determine if he meets the criteria for psychiatric inpatient admission; he could be discharged home. On evaluation, the patient is alert  and oriented x 4, irritable, intoxicated but cooperative, and mood-congruent with affect. The patient does appear to be responding to internal and external stimuli. Neither is the patient presenting with any delusional thinking. The patient denies auditory or visual hallucinations. The patient admits to suicidal, homicidal ideations. The patient is not presenting with any psychotic or paranoid behaviors. During an encounter with the patient, he could answer questions somewhat appropriately.   HPI:  Per Dr. Fuller Plan: Joshua [name] is a 35 y.o. male depression who comes in with SI.  Patient comes in with concerns for SI, HI.  When asked patient he had been drinking tonight he laughed.  I asked if he had been having thoughts of hurting himself but he states "I tried to hang myself back in March."  I asked him if he had a plan and he stated "good thing I do not have a gun".  Unclear how long these thoughts have been going on, severe, constant, nothing makes them better, nothing makes them worse.  Reports a mild intermittent cough but denies any other medical concern  Past Psychiatric History:  ADD (attention deficit disorder)    Depression   Paranoid (HCC)   Risk to Self:   Risk to Others:   Prior Inpatient Therapy:   Prior Outpatient Therapy:    Past Medical History:  Past Medical History:  Diagnosis Date   ADD (attention deficit disorder)    Depression    Paranoid (HCC)     Past Surgical History:  Procedure Laterality Date   HERNIA REPAIR     Family History: No family history on file.  Family Psychiatric  History:  Social History:  Social History   Substance and Sexual Activity  Alcohol Use Yes   Comment: occassional 24oz beer at night     Social History   Substance and Sexual Activity  Drug Use No    Social History   Socioeconomic History   Marital status: Single    Spouse name: Not on file   Number of children: Not on file   Years of education: Not on file   Highest  education level: Not on file  Occupational History   Not on file  Tobacco Use   Smoking status: Every Day    Packs/day: 0.50    Pack years: 0.00    Types: Cigarettes   Smokeless tobacco: Never  Substance and Sexual Activity   Alcohol use: Yes    Comment: occassional 24oz beer at night   Drug use: No   Sexual activity: Not on file  Other Topics Concern   Not on file  Social History Narrative   Not on file   Social Determinants of Health   Financial Resource Strain: Not on file  Food Insecurity: Not on file  Transportation Needs: Not on file  Physical Activity: Not on file  Stress: Not on file  Social Connections: Not on file   Additional Social History:    Allergies:  No Known Allergies  Labs:  Results for orders placed or performed during the hospital encounter of 07/10/20 (from the past 48 hour(s))  CBC     Status: None   Collection Time: 07/10/20  9:24 PM  Result Value Ref Range   WBC 7.4 4.0 - 10.5 K/uL   RBC 4.97 4.22 - 5.81 MIL/uL   Hemoglobin 15.3 13.0 - 17.0 g/dL   HCT 16.1 09.6 - 04.5 %   MCV 92.0 80.0 - 100.0 fL   MCH 30.8 26.0 - 34.0 pg   MCHC 33.5 30.0 - 36.0 g/dL   RDW 40.9 81.1 - 91.4 %   Platelets 229 150 - 400 K/uL   nRBC 0.0 0.0 - 0.2 %    Comment: Performed at [facility], [address] [place], [place] [address]  Comprehensive metabolic panel     Status: Abnormal   Collection Time: 07/10/20  9:24 PM  Result Value Ref Range   Sodium 137 135 - 145 mmol/L   Potassium 4.2 3.5 - 5.1 mmol/L   Chloride 105 98 - 111 mmol/L   CO2 23 22 - 32 mmol/L   Glucose, Bld 110 (H) 70 - 99 mg/dL    Comment: Glucose reference range applies only to samples taken after fasting for at least 8 hours.   BUN 12 6 - 20 mg/dL   Creatinine, Ser 6.21 0.61 - 1.24 mg/dL   Calcium 9.1 8.9 - 30.8 mg/dL   Total Protein 7.8 6.5 - 8.1 g/dL   Albumin 4.7 3.5 - 5.0 g/dL   AST 19 15 - 41 U/L   ALT 22 0 - 44 U/L   Alkaline Phosphatase 58 38 - 126 U/L   Total  Bilirubin 0.7 0.3 - 1.2 mg/dL   GFR, Estimated >65 >78 mL/min    Comment: (NOTE) Calculated using the CKD-EPI Creatinine Equation (2021)    Anion gap 9 5 - 15    Comment: Performed at [facility], [address], [place], [place] [address]  Ethanol     Status: Abnormal   Collection Time: 07/10/20  9:24 PM  Result Value Ref Range   Alcohol,  Ethyl (B) 251 (H) <10 mg/dL    Comment: (NOTE) Lowest detectable limit for serum alcohol is 10 mg/dL.  For medical purposes only. Performed at [facility], [address] [place], [place] [address]   Urinalysis, Complete w Microscopic     Status: Abnormal   Collection Time: 07/10/20  9:24 PM  Result Value Ref Range   Color, Urine STRAW (A) YELLOW   APPearance CLEAR (A) CLEAR   Specific Gravity, Urine 1.005 1.005 - 1.030   pH 5.0 5.0 - 8.0   Glucose, UA NEGATIVE NEGATIVE mg/dL   Hgb urine dipstick NEGATIVE NEGATIVE   Bilirubin Urine NEGATIVE NEGATIVE   Ketones, ur NEGATIVE NEGATIVE mg/dL   Protein, ur NEGATIVE NEGATIVE mg/dL   Nitrite NEGATIVE NEGATIVE   Leukocytes,Ua NEGATIVE NEGATIVE   RBC / HPF 0-5 0 - 5 RBC/hpf   WBC, UA 0-5 0 - 5 WBC/hpf   Bacteria, UA NONE SEEN NONE SEEN   Squamous Epithelial / LPF 0-5 0 - 5   Mucus PRESENT     Comment: Performed at [facility], [address], [place], [place] [address]  Urine Drug Screen, Qualitative ([facility] only)     Status: None   Collection Time: 07/10/20  9:24 PM  Result Value Ref Range   Tricyclic, Ur Screen NONE DETECTED NONE DETECTED   Amphetamines, Ur Screen NONE DETECTED NONE DETECTED   MDMA (Ecstasy)Ur Screen NONE DETECTED NONE DETECTED   Cocaine Metabolite,Ur [place] NONE DETECTED NONE DETECTED   Opiate, Ur Screen NONE DETECTED NONE DETECTED   Phencyclidine (PCP) Ur S NONE DETECTED NONE DETECTED   Cannabinoid 50 Ng, Ur [place] NONE DETECTED NONE DETECTED   Barbiturates, Ur Screen NONE DETECTED NONE DETECTED   Benzodiazepine, Ur Scrn NONE DETECTED NONE DETECTED    Methadone Scn, Ur NONE DETECTED NONE DETECTED    Comment: (NOTE) Tricyclics + metabolites, urine    Cutoff 1000 ng/mL Amphetamines + metabolites, urine  Cutoff 1000 ng/mL MDMA (Ecstasy), urine              Cutoff 500 ng/mL Cocaine Metabolite, urine          Cutoff 300 ng/mL Opiate + metabolites, urine        Cutoff 300 ng/mL Phencyclidine (PCP), urine         Cutoff 25 ng/mL Cannabinoid, urine                 Cutoff 50 ng/mL Barbiturates + metabolites, urine  Cutoff 200 ng/mL Benzodiazepine, urine              Cutoff 200 ng/mL Methadone, urine                   Cutoff 300 ng/mL  The urine drug screen provides only a preliminary, unconfirmed analytical test result and should not be used for non-medical purposes. Clinical consideration and professional judgment should be applied to any positive drug screen result due to possible interfering substances. A more specific alternate chemical method must be used in order to obtain a confirmed analytical result. Gas chromatography / mass spectrometry (GC/MS) is the preferred confirm atory method. Performed at [facility], [address] [place], [place] [address]   Resp Panel by RT-PCR (Flu A&B, Covid) Nasopharyngeal Swab     Status: None   Collection Time: 07/10/20 10:51 PM   Specimen: Nasopharyngeal Swab; Nasopharyngeal(NP) swabs in vial transport medium  Result Value Ref Range   SARS Coronavirus 2 by RT PCR NEGATIVE NEGATIVE  Comment: (NOTE) SARS-CoV-2 target nucleic acids are NOT DETECTED.  The SARS-CoV-2 RNA is generally detectable in upper respiratory specimens during the acute phase of infection. The lowest concentration of SARS-CoV-2 viral copies this assay can detect is 138 copies/mL. A negative result does not preclude SARS-Cov-2 infection and should not be used as the sole basis for treatment or other patient management decisions. A negative result may occur with  improper specimen collection/handling,  submission of specimen other than nasopharyngeal swab, presence of viral mutation(s) within the areas targeted by this assay, and inadequate number of viral copies(<138 copies/mL). A negative result must be combined with clinical observations, patient history, and epidemiological information. The expected result is Negative.  Fact Sheet for Patients:  BloggerCourse.com  Fact Sheet for Healthcare Providers:  SeriousBroker.it  This test is no t yet approved or cleared by the [place] FDA and  has been authorized for detection and/or diagnosis of SARS-CoV-2 by FDA under an Emergency Use Authorization (EUA). This EUA will remain  in effect (meaning this test can be used) for the duration of the COVID-19 declaration under Section 564(b)(1) of the Act, 21 U.S.C.section 360bbb-3(b)(1), unless the authorization is terminated  or revoked sooner.       Influenza A by PCR NEGATIVE NEGATIVE   Influenza B by PCR NEGATIVE NEGATIVE    Comment: (NOTE) The Xpert Xpress SARS-CoV-2/FLU/RSV plus assay is intended as an aid in the diagnosis of influenza from Nasopharyngeal swab specimens and should not be used as a sole basis for treatment. Nasal washings and aspirates are unacceptable for Xpert Xpress SARS-CoV-2/FLU/RSV testing.  Fact Sheet for Patients: BloggerCourse.com  Fact Sheet for Healthcare Providers: SeriousBroker.it  This test is not yet approved or cleared by the [place] FDA and has been authorized for detection and/or diagnosis of SARS-CoV-2 by FDA under an Emergency Use Authorization (EUA). This EUA will remain in effect (meaning this test can be used) for the duration of the COVID-19 declaration under Section 564(b)(1) of the Act, 21 U.S.C. section 360bbb-3(b)(1), unless the authorization is terminated or revoked.  Performed at [facility], [address] [place], [place] [address]     No current facility-administered medications for this encounter.   Current Outpatient Medications  Medication Sig Dispense Refill   buPROPion (WELLBUTRIN XL) 150 MG 24 hr tablet Take 1 tablet (150 mg total) by mouth daily. 30 tablet 1   buPROPion (WELLBUTRIN XL) 150 MG 24 hr tablet TAKE 1 TABLET (150 MG TOTAL) BY MOUTH DAILY. 7 tablet 0   buPROPion (WELLBUTRIN XL) 300 MG 24 hr tablet Take 1 tablet (300 mg total) by mouth in the morning. 30 tablet 1   hydrOXYzine (ATARAX/VISTARIL) 50 MG tablet Take 1 tablet (50 mg total) by mouth 3 (three) times daily as needed for anxiety. 90 tablet 1   hydrOXYzine (ATARAX/VISTARIL) 50 MG tablet Take 1 tablet (50 mg total) by mouth 2 (two) times daily as needed. 60 tablet 1   lisinopril (ZESTRIL) 5 MG tablet Take 1 tablet (5 mg total) by mouth daily. 30 tablet 1   lisinopril (ZESTRIL) 5 MG tablet TAKE 1 TABLET (5 MG TOTAL) BY MOUTH DAILY. 7 tablet 0   naltrexone (DEPADE) 50 MG tablet Take 1 tablet (50 mg total) by mouth daily. 30 tablet 1   naltrexone (DEPADE) 50 MG tablet TAKE 1 TABLET (50 MG TOTAL) BY MOUTH DAILY. 7 tablet 0   traZODone (DESYREL) 100 MG tablet Take 1 tablet (100 mg total) by mouth at bedtime  as needed for sleep. 30 tablet 1   traZODone (DESYREL) 100 MG tablet Take 1 tablet (100 mg total) by mouth at bedtime as needed. 30 tablet 1   traZODone (DESYREL) 100 MG tablet TAKE 1 TABLET (100 MG TOTAL) BY MOUTH AT BEDTIME AS NEEDED FOR SLEEP. 7 tablet 0    Musculoskeletal: Strength & Muscle Tone: within normal limits Gait & Station: normal Patient leans: N/A            Psychiatric Specialty Exam:  Presentation  General Appearance: Bizarre  Eye Contact:Fair  Speech:Slow; Slurred  Speech Volume:Decreased  Handedness:Right   Mood and Affect  Mood:Irritable  Affect:Congruent   Thought Process  Thought Processes:Coherent  Descriptions of Associations:Loose  Orientation:Full (Time, Place  and Person)  Thought Content:Tangential; Logical  History of Schizophrenia/Schizoaffective disorder:No  Duration of Psychotic Symptoms:No data recorded Hallucinations:Hallucinations: None  Ideas of Reference:None  Suicidal Thoughts:Suicidal Thoughts: Yes, Passive SI Passive Intent and/or Plan: Without Intent; Without Plan  Homicidal Thoughts:Homicidal Thoughts: Yes, Passive HI Passive Intent and/or Plan: Without Plan   Sensorium  Memory:Immediate Good; Recent Good; Remote Good  Judgment:Poor  Insight:Shallow   Executive Functions  Concentration:Fair  Attention Span:Fair  Recall:Fair  Fund of Knowledge:Fair  Language:Fair   Psychomotor Activity  Psychomotor Activity:Psychomotor Activity: Normal   Assets  Assets:Communication Skills; Desire for Improvement; Housing; Physical Health; Resilience; Social Support   Sleep  Sleep:Sleep: Fair   Physical Exam: Physical Exam Vitals and nursing note reviewed.  Constitutional:      Appearance: Normal appearance.  HENT:     Head: Normocephalic.     Nose: Nose normal.  Cardiovascular:     Rate and Rhythm: Normal rate.     Pulses: Normal pulses.  Pulmonary:     Effort: Pulmonary effort is normal.  Musculoskeletal:        General: Normal range of motion.     Cervical back: Normal range of motion.  Neurological:     General: No focal deficit present.     Mental Status: He is alert and oriented to person, place, and time.  Psychiatric:        Attention and Perception: Attention normal.        Mood and Affect: Mood is anxious and depressed. Affect is angry.        Speech: Speech is rapid and pressured.        Behavior: Behavior is agitated. Behavior is cooperative.        Thought Content: Thought content includes homicidal and suicidal ideation.        Cognition and Memory: Cognition is impaired.        Judgment: Judgment is impulsive and inappropriate.   Review of Systems  Psychiatric/Behavioral:  Positive  for depression and suicidal ideas. The patient is nervous/anxious and has insomnia.   All other systems reviewed and are negative. Blood pressure (!) 126/102, pulse 84, temperature 98.3 F (36.8 C), temperature source Oral, resp. rate 20, height  (1.626 m), weight 72.6 kg, SpO2 96 %. Body mass index is 27.46 kg/m.  Treatment Plan Summary: Daily contact with patient to assess and evaluate symptoms and progress in treatment, Medication management, and Plan The patient remained under observation overnight and will be reassessed in the a.m. to determine if he meets the criteria for psychiatric inpatient admission; he could be discharged home.  Disposition: Supportive therapy provided about ongoing stressors. The patient remained under observation overnight and will be reassessed in the a.m. to determine if he meets the criteria  for psychiatric inpatient admission; he could be discharged home.  Gillermo Murdoch, NP 07/11/2020 12:12 AM

## 2020-07-12 ENCOUNTER — Encounter (HOSPITAL_COMMUNITY): Payer: Self-pay | Admitting: Psychiatry

## 2020-07-12 ENCOUNTER — Inpatient Hospital Stay (HOSPITAL_COMMUNITY)
Admission: AD | Admit: 2020-07-12 | Discharge: 2020-07-17 | DRG: 885 | Disposition: A | Discharge status: Home / Self Care | Payer: Federal, State, Local not specified - Other | Source: Intra-hospital | Attending: Psychiatry | Admitting: Psychiatry

## 2020-07-12 ENCOUNTER — Other Ambulatory Visit: Payer: Self-pay

## 2020-07-12 DIAGNOSIS — F10230 Alcohol dependence with withdrawal, uncomplicated: Secondary | ICD-10-CM | POA: Diagnosis not present

## 2020-07-12 DIAGNOSIS — Z820 Family history of epilepsy and other diseases of the nervous system: Secondary | ICD-10-CM | POA: Diagnosis not present

## 2020-07-12 DIAGNOSIS — F332 Major depressive disorder, recurrent severe without psychotic features: Secondary | ICD-10-CM | POA: Diagnosis present

## 2020-07-12 DIAGNOSIS — R45851 Suicidal ideations: Secondary | ICD-10-CM | POA: Diagnosis present

## 2020-07-12 DIAGNOSIS — Z79899 Other long term (current) drug therapy: Secondary | ICD-10-CM

## 2020-07-12 DIAGNOSIS — K219 Gastro-esophageal reflux disease without esophagitis: Secondary | ICD-10-CM | POA: Diagnosis present

## 2020-07-12 DIAGNOSIS — F419 Anxiety disorder, unspecified: Secondary | ICD-10-CM | POA: Diagnosis present

## 2020-07-12 DIAGNOSIS — I10 Essential (primary) hypertension: Secondary | ICD-10-CM | POA: Diagnosis present

## 2020-07-12 DIAGNOSIS — G4700 Insomnia, unspecified: Secondary | ICD-10-CM | POA: Diagnosis present

## 2020-07-12 DIAGNOSIS — F1994 Other psychoactive substance use, unspecified with psychoactive substance-induced mood disorder: Secondary | ICD-10-CM | POA: Diagnosis not present

## 2020-07-12 DIAGNOSIS — Z9151 Personal history of suicidal behavior: Secondary | ICD-10-CM

## 2020-07-12 DIAGNOSIS — F17210 Nicotine dependence, cigarettes, uncomplicated: Secondary | ICD-10-CM | POA: Diagnosis present

## 2020-07-12 DIAGNOSIS — F1024 Alcohol dependence with alcohol-induced mood disorder: Secondary | ICD-10-CM | POA: Diagnosis not present

## 2020-07-12 DIAGNOSIS — F1020 Alcohol dependence, uncomplicated: Secondary | ICD-10-CM | POA: Diagnosis present

## 2020-07-12 MED ORDER — THIAMINE HCL 100 MG PO TABS: 100.0000 mg | ORAL_TABLET | Freq: Every day | ORAL | Status: DC

## 2020-07-12 MED ORDER — LORAZEPAM 1 MG PO TABS
1.0000 mg | ORAL_TABLET | Freq: Four times a day (QID) | ORAL | Status: DC
  Administered 2020-07-12: 1 mg via ORAL
  Filled 2020-07-12: qty 1

## 2020-07-12 MED ORDER — LORAZEPAM 1 MG PO TABS: 1.0000 mg | ORAL_TABLET | Freq: Every day | ORAL | Status: DC

## 2020-07-12 MED ORDER — HYDROXYZINE HCL 25 MG PO TABS: 25.0000 mg | ORAL_TABLET | Freq: Four times a day (QID) | ORAL | Status: DC | PRN

## 2020-07-12 MED ORDER — LISINOPRIL 5 MG PO TABS
5.0000 mg | ORAL_TABLET | Freq: Every day | ORAL | Status: DC
  Administered 2020-07-13 – 2020-07-17 (×5): 5 mg via ORAL
  Filled 2020-07-12 (×2): qty 1
  Filled 2020-07-12: qty 7
  Filled 2020-07-12 (×4): qty 1
  Filled 2020-07-12: qty 7

## 2020-07-12 MED ORDER — LISINOPRIL 5 MG PO TABS
5.0000 mg | ORAL_TABLET | Freq: Every day | ORAL | Status: DC
  Administered 2020-07-12: 5 mg via ORAL
  Filled 2020-07-12: qty 1

## 2020-07-12 MED ORDER — HYDROXYZINE HCL 25 MG PO TABS
25.0000 mg | ORAL_TABLET | Freq: Four times a day (QID) | ORAL | Status: DC | PRN
  Administered 2020-07-12 – 2020-07-15 (×2): 25 mg via ORAL
  Filled 2020-07-12 (×2): qty 1

## 2020-07-12 MED ORDER — ADULT MULTIVITAMIN W/MINERALS CH
1.0000 | ORAL_TABLET | Freq: Every day | ORAL | Status: DC
  Administered 2020-07-13 – 2020-07-17 (×5): 1 via ORAL
  Filled 2020-07-12 (×7): qty 1

## 2020-07-12 MED ORDER — LORAZEPAM 1 MG PO TABS: 1.0000 mg | ORAL_TABLET | Freq: Two times a day (BID) | ORAL | Status: DC

## 2020-07-12 MED ORDER — BUPROPION HCL ER (XL) 300 MG PO TB24
300.0000 mg | ORAL_TABLET | Freq: Every day | ORAL | Status: DC
  Administered 2020-07-13 – 2020-07-15 (×3): 300 mg via ORAL
  Filled 2020-07-12 (×5): qty 1

## 2020-07-12 MED ORDER — ADULT MULTIVITAMIN W/MINERALS CH
1.0000 | ORAL_TABLET | Freq: Every day | ORAL | Status: DC
  Administered 2020-07-12: 1 via ORAL
  Filled 2020-07-12: qty 1

## 2020-07-12 MED ORDER — THIAMINE HCL 100 MG/ML IJ SOLN
100.0000 mg | Freq: Once | INTRAMUSCULAR | Status: AC
  Administered 2020-07-12: 100 mg via INTRAMUSCULAR

## 2020-07-12 MED ORDER — LORAZEPAM 1 MG PO TABS: 1.0000 mg | ORAL_TABLET | Freq: Three times a day (TID) | ORAL | Status: DC

## 2020-07-12 MED ORDER — THIAMINE HCL 100 MG PO TABS
100.0000 mg | ORAL_TABLET | Freq: Every day | ORAL | Status: DC
  Administered 2020-07-13 – 2020-07-17 (×5): 100 mg via ORAL
  Filled 2020-07-12 (×7): qty 1

## 2020-07-12 MED ORDER — BUPROPION HCL ER (XL) 300 MG PO TB24
300.0000 mg | ORAL_TABLET | Freq: Every day | ORAL | Status: DC
  Administered 2020-07-12: 300 mg via ORAL
  Filled 2020-07-12: qty 1

## 2020-07-12 MED ORDER — ENSURE ENLIVE PO LIQD
237.0000 mL | Freq: Two times a day (BID) | ORAL | Status: DC
  Administered 2020-07-13 – 2020-07-17 (×8): 237 mL via ORAL
  Filled 2020-07-12 (×13): qty 237

## 2020-07-12 MED ORDER — TRAZODONE HCL 100 MG PO TABS
100.0000 mg | ORAL_TABLET | Freq: Every day | ORAL | Status: DC
  Administered 2020-07-12 – 2020-07-14 (×3): 100 mg via ORAL
  Filled 2020-07-12 (×7): qty 1

## 2020-07-12 MED ORDER — TRAZODONE HCL 100 MG PO TABS: 100.0000 mg | ORAL_TABLET | Freq: Every day | ORAL | Status: DC

## 2020-07-12 MED ORDER — LOPERAMIDE HCL 2 MG PO CAPS
2.0000 mg | ORAL_CAPSULE | ORAL | Status: DC | PRN
  Filled 2020-07-12: qty 2

## 2020-07-12 MED ORDER — LORAZEPAM 1 MG PO TABS
1.0000 mg | ORAL_TABLET | Freq: Four times a day (QID) | ORAL | Status: AC | PRN
  Administered 2020-07-13 – 2020-07-14 (×2): 1 mg via ORAL
  Filled 2020-07-12 (×2): qty 1

## 2020-07-12 MED ORDER — ONDANSETRON 4 MG PO TBDP
4.0000 mg | ORAL_TABLET | Freq: Four times a day (QID) | ORAL | Status: AC | PRN
  Administered 2020-07-13 – 2020-07-15 (×4): 4 mg via ORAL
  Filled 2020-07-12 (×4): qty 1

## 2020-07-12 MED ORDER — LOPERAMIDE HCL 2 MG PO CAPS: 2.0000 mg | ORAL_CAPSULE | ORAL | Status: AC | PRN

## 2020-07-12 MED ORDER — ONDANSETRON 4 MG PO TBDP
4.0000 mg | ORAL_TABLET | Freq: Four times a day (QID) | ORAL | Status: DC | PRN
  Filled 2020-07-12: qty 1

## 2020-07-12 MED ORDER — LORAZEPAM 1 MG PO TABS: 1.0000 mg | ORAL_TABLET | Freq: Four times a day (QID) | ORAL | Status: DC | PRN

## 2020-07-12 NOTE — ED Notes (Signed)
VS assess. Shower offered. Breakfast tray given. No other needs found.  

## 2020-07-12 NOTE — Consult Note (Signed)
[facility] Face-to-Face Psychiatry Consult   Reason for Consult: Suicidal Referring Physician: Dr. Fuller PlanFunke Patient Identification: Joshua [name] MRN:  [id number] Principal Diagnosis: Severe recurrent major depression without psychotic features (HCC) Diagnosis:  Principal Problem:   Severe recurrent major depression without psychotic features (HCC) Active Problems:   Alcohol use disorder, severe, dependence (HCC)   ADD (attention deficit disorder)   Dysthymia   MDD (major depressive disorder), recurrent episode, severe (HCC)   Alcohol abuse   Suicide attempt (HCC)   Total Time spent with patient: 30 minutes  Subjective: "I will go anywhere that can help me."  35 yo male with alcohol use disorder with depression and suicidal ideations.  He did have a plan to hang himself, attempted earlier this year.  Today, he contracts for safety and feels hopeful that he is getting help.  He is motivated for change as he wants to be able to see his daughter.  After detox, he wants to continue his recovery with [facility] and/or rehab.  He is vested in his treatment.  [facility] and medications for depression.  Stress increases his drinking and support decreases his drinking.  [facility] [place].  HPI on assessment by Gillermo MurdochJacqueline Thompson, NP: Grace BlightMichael [name] is a 35 y.o. male patient [facility] ED was initially voluntary but has since been placed under involuntary commitment status (IVC). The patient expressed, "I want to kill myself." The patient stated, "you imagine not seeing your daughter; she is about a year and a half old, and the mom is a piece of shit." The patient shared that he went to [facility] to seek assistance for his substance use but believes that nobody wants to help him. The patient was [facility] inpatient [facility] in March 2022. He acknowledged drinking alcohol today "3 beers that were 80% and some Bud Light." The patient expressed  that he has been drinking "since Elementary or Middle school; it got worse when I graduated high school." The patient discussed using Cocaine; he reports his last use being last Monday. His BAL is 251 mg/dl and negative for illicit substances. The patient was seen face-to-face by this provider; the chart was reviewed and consulted with Dr. Fuller PlanFunke on 07/10/2020 due to the patient's care. It was discussed with the EDP that the patient remained under observation overnight and will be reassessed in the a.m. to determine if he meets the criteria for psychiatric inpatient admission; he could be discharged home. On evaluation, the patient is alert and oriented x 4, irritable, intoxicated but cooperative, and mood-congruent with affect. The patient does appear to be responding to internal and external stimuli. Neither is the patient presenting with any delusional thinking. The patient denies auditory or visual hallucinations. The patient admits to suicidal, homicidal ideations. The patient is not presenting with any psychotic or paranoid behaviors. During an encounter with the patient, he could answer questions somewhat appropriately.   HPI:  Per Dr. Fuller PlanFunke: Joshua [name] is a [age] y.o. male depression who comes in with SI.  Patient comes in with concerns for SI, HI.  When asked patient he had been drinking tonight he laughed.  I asked if he had been having thoughts of hurting himself but he states "I tried to hang myself back in March."  I asked him if he had a plan and he stated "good thing I do not have a gun".  Unclear how long these thoughts have been going  on, severe, constant, nothing makes them better, nothing makes them worse.  Reports a mild intermittent cough but denies any other medical concern  Past Psychiatric History:  ADD (attention deficit disorder)    Depression   Paranoid (HCC)   Risk to Self:   Risk to Others:   Prior Inpatient Therapy:   Prior Outpatient Therapy:    Past Medical History:   Past Medical History:  Diagnosis Date   ADD (attention deficit disorder)    Depression    Paranoid (HCC)     Past Surgical History:  Procedure Laterality Date   HERNIA REPAIR     Family History: No family history on file. Family Psychiatric  History:  Social History:  Social History   Substance and Sexual Activity  Alcohol Use Yes   Comment: occassional 24oz beer at night     Social History   Substance and Sexual Activity  Drug Use No    Social History   Socioeconomic History   Marital status: Single    Spouse name: Not on file   Number of children: Not on file   Years of education: Not on file   Highest education level: Not on file  Occupational History   Not on file  Tobacco Use   Smoking status: Every Day    Packs/day: 0.50    Pack years: 0.00    Types: Cigarettes   Smokeless tobacco: Never  Substance and Sexual Activity   Alcohol use: Yes    Comment: occassional 24oz beer at night   Drug use: No   Sexual activity: Not on file  Other Topics Concern   Not on file  Social History Narrative   Not on file   Social Determinants of Health   Financial Resource Strain: Not on file  Food Insecurity: Not on file  Transportation Needs: Not on file  Physical Activity: Not on file  Stress: Not on file  Social Connections: Not on file   Additional Social History:    Allergies:  No Known Allergies  Labs:  Results for orders placed or performed during the hospital encounter of 07/10/20 (from the past 48 hour(s))  CBC     Status: None   Collection Time: 07/10/20  9:24 PM  Result Value Ref Range   WBC 7.4 4.0 - 10.5 K/uL   RBC 4.97 4.22 - 5.81 MIL/uL   Hemoglobin 15.3 13.0 - 17.0 g/dL   HCT 37.1 06.2 - 69.4 %   MCV 92.0 80.0 - 100.0 fL   MCH 30.8 26.0 - 34.0 pg   MCHC 33.5 30.0 - 36.0 g/dL   RDW 85.4 62.7 - 03.5 %   Platelets 229 150 - 400 K/uL   nRBC 0.0 0.0 - 0.2 %    Comment: Performed at [facility], [address] [place], [place]  [address]  Comprehensive metabolic panel     Status: Abnormal   Collection Time: 07/10/20  9:24 PM  Result Value Ref Range   Sodium 137 135 - 145 mmol/L   Potassium 4.2 3.5 - 5.1 mmol/L   Chloride 105 98 - 111 mmol/L   CO2 23 22 - 32 mmol/L   Glucose, Bld 110 (H) 70 - 99 mg/dL    Comment: Glucose reference range applies only to samples taken after fasting for at least 8 hours.   BUN 12 6 - 20 mg/dL   Creatinine, Ser 1.82 0.61 - 1.24 mg/dL   Calcium 9.1 8.9 - 99.3 mg/dL   Total Protein 7.8  6.5 - 8.1 g/dL   Albumin 4.7 3.5 - 5.0 g/dL   AST 19 15 - 41 U/L   ALT 22 0 - 44 U/L   Alkaline Phosphatase 58 38 - 126 U/L   Total Bilirubin 0.7 0.3 - 1.2 mg/dL   GFR, Estimated >93 >71 mL/min    Comment: (NOTE) Calculated using the CKD-EPI Creatinine Equation (2021)    Anion gap 9 5 - 15    Comment: Performed at [facility], [address] [place], [place] [address]  Ethanol     Status: Abnormal   Collection Time: 07/10/20  9:24 PM  Result Value Ref Range   Alcohol, Ethyl (B) 251 (H) <10 mg/dL    Comment: (NOTE) Lowest detectable limit for serum alcohol is 10 mg/dL.  For medical purposes only. Performed at [facility], [address] [place], [place] [address]   Urinalysis, Complete w Microscopic     Status: Abnormal   Collection Time: 07/10/20  9:24 PM  Result Value Ref Range   Color, Urine STRAW (A) YELLOW   APPearance CLEAR (A) CLEAR   Specific Gravity, Urine 1.005 1.005 - 1.030   pH 5.0 5.0 - 8.0   Glucose, UA NEGATIVE NEGATIVE mg/dL   Hgb urine dipstick NEGATIVE NEGATIVE   Bilirubin Urine NEGATIVE NEGATIVE   Ketones, ur NEGATIVE NEGATIVE mg/dL   Protein, ur NEGATIVE NEGATIVE mg/dL   Nitrite NEGATIVE NEGATIVE   Leukocytes,Ua NEGATIVE NEGATIVE   RBC / HPF 0-5 0 - 5 RBC/hpf   WBC, UA 0-5 0 - 5 WBC/hpf   Bacteria, UA NONE SEEN NONE SEEN   Squamous Epithelial / LPF 0-5 0 - 5   Mucus PRESENT     Comment: Performed at [facility], [address], [place], [place] [address]  Urine Drug Screen, Qualitative ([facility] only)     Status: None   Collection Time: 07/10/20  9:24 PM  Result Value Ref Range   Tricyclic, Ur Screen NONE DETECTED NONE DETECTED   Amphetamines, Ur Screen NONE DETECTED NONE DETECTED   MDMA (Ecstasy)Ur Screen NONE DETECTED NONE DETECTED   Cocaine Metabolite,Ur [place] NONE DETECTED NONE DETECTED   Opiate, Ur Screen NONE DETECTED NONE DETECTED   Phencyclidine (PCP) Ur S NONE DETECTED NONE DETECTED   Cannabinoid 50 Ng, Ur [place] NONE DETECTED NONE DETECTED   Barbiturates, Ur Screen NONE DETECTED NONE DETECTED   Benzodiazepine, Ur Scrn NONE DETECTED NONE DETECTED   Methadone Scn, Ur NONE DETECTED NONE DETECTED    Comment: (NOTE) Tricyclics + metabolites, urine    Cutoff 1000 ng/mL Amphetamines + metabolites, urine  Cutoff 1000 ng/mL MDMA (Ecstasy), urine              Cutoff 500 ng/mL Cocaine Metabolite, urine          Cutoff 300 ng/mL Opiate + metabolites, urine        Cutoff 300 ng/mL Phencyclidine (PCP), urine         Cutoff 25 ng/mL Cannabinoid, urine                 Cutoff 50 ng/mL Barbiturates + metabolites, urine  Cutoff 200 ng/mL Benzodiazepine, urine              Cutoff 200 ng/mL Methadone, urine                   Cutoff 300 ng/mL  The urine drug screen provides only a preliminary, unconfirmed analytical test result and should not be used  for non-medical purposes. Clinical consideration and professional judgment should be applied to any positive drug screen result due to possible interfering substances. A more specific alternate chemical method must be used in order to obtain a confirmed analytical result. Gas chromatography / mass spectrometry (GC/MS) is the preferred confirm atory method. Performed at [facility], [address] [place], [place] [address]   Resp Panel by RT-PCR (Flu A&B, Covid) Nasopharyngeal Swab     Status: None   Collection Time: 07/10/20 10:51 PM   Specimen: Nasopharyngeal Swab;  Nasopharyngeal(NP) swabs in vial transport medium  Result Value Ref Range   SARS Coronavirus 2 by RT PCR NEGATIVE NEGATIVE    Comment: (NOTE) SARS-CoV-2 target nucleic acids are NOT DETECTED.  The SARS-CoV-2 RNA is generally detectable in upper respiratory specimens during the acute phase of infection. The lowest concentration of SARS-CoV-2 viral copies this assay can detect is 138 copies/mL. A negative result does not preclude SARS-Cov-2 infection and should not be used as the sole basis for treatment or other patient management decisions. A negative result may occur with  improper specimen collection/handling, submission of specimen other than nasopharyngeal swab, presence of viral mutation(s) within the areas targeted by this assay, and inadequate number of viral copies(<138 copies/mL). A negative result must be combined with clinical observations, patient history, and epidemiological information. The expected result is Negative.  Fact Sheet for Patients:  BloggerCourse.com  Fact Sheet for Healthcare Providers:  SeriousBroker.it  This test is no t yet approved or cleared by the [place] FDA and  has been authorized for detection and/or diagnosis of SARS-CoV-2 by FDA under an Emergency Use Authorization (EUA). This EUA will remain  in effect (meaning this test can be used) for the duration of the COVID-19 declaration under Section 564(b)(1) of the Act, 21 U.S.C.section 360bbb-3(b)(1), unless the authorization is terminated  or revoked sooner.       Influenza A by PCR NEGATIVE NEGATIVE   Influenza B by PCR NEGATIVE NEGATIVE    Comment: (NOTE) The Xpert Xpress SARS-CoV-2/FLU/RSV plus assay is intended as an aid in the diagnosis of influenza from Nasopharyngeal swab specimens and should not be used as a sole basis for treatment. Nasal washings and aspirates are unacceptable for Xpert Xpress  SARS-CoV-2/FLU/RSV testing.  Fact Sheet for Patients: BloggerCourse.com  Fact Sheet for Healthcare Providers: SeriousBroker.it  This test is not yet approved or cleared by the [place] FDA and has been authorized for detection and/or diagnosis of SARS-CoV-2 by FDA under an Emergency Use Authorization (EUA). This EUA will remain in effect (meaning this test can be used) for the duration of the COVID-19 declaration under Section 564(b)(1) of the Act, 21 U.S.C. section 360bbb-3(b)(1), unless the authorization is terminated or revoked.  Performed at [facility], [address] [place], [place] [address]     No current facility-administered medications for this encounter.   Current Outpatient Medications  Medication Sig Dispense Refill   buPROPion (WELLBUTRIN XL) 300 MG 24 hr tablet Take 1 tablet (300 mg total) by mouth in the morning. 30 tablet 1   hydrOXYzine (ATARAX/VISTARIL) 50 MG tablet Take 1 tablet (50 mg total) by mouth 2 (two) times daily as needed. 60 tablet 1   ibuprofen (ADVIL) 200 MG tablet Take 200 mg by mouth every 6 (six) hours as needed for mild pain, moderate pain or headache.     lisinopril (ZESTRIL) 5 MG tablet TAKE 1 TABLET (5 MG TOTAL) BY MOUTH DAILY. 7 tablet 0  traZODone (DESYREL) 100 MG tablet Take 1 tablet (100 mg total) by mouth at bedtime as needed. 30 tablet 1   buPROPion (WELLBUTRIN XL) 150 MG 24 hr tablet TAKE 1 TABLET (150 MG TOTAL) BY MOUTH DAILY. (Patient not taking: No sig reported) 7 tablet 0   naltrexone (DEPADE) 50 MG tablet TAKE 1 TABLET (50 MG TOTAL) BY MOUTH DAILY. (Patient not taking: Reported on 07/11/2020) 7 tablet 0    Musculoskeletal: Strength & Muscle Tone: within normal limits Gait & Station: normal Patient leans: N/A   Psychiatric Specialty Exam:  Presentation  General Appearance:  Casual Eye Contact:Fair  Speech: Normal Speech Volume:  WDL Handedness:Right   Mood and Affect  Mood: Anxious, depressed Affect:Congruent   Thought Process  Thought Processes:Coherent  Descriptions of Associations: Logical Orientation:Full (Time, Place and Person)  Thought Content: Clear and coherent History of Schizophrenia/Schizoaffective disorder:No  Duration of Psychotic Symptoms: NA Hallucinations: None  Ideas of Reference:None  Suicidal Thoughts:  Yes with plan, no intent today  Homicidal Thoughts: None   Sensorium  Memory:Immediate Good; Recent Good; Remote Good  Judgment: Fair Insight: Fair  Art therapist  Concentration:Fair  Attention Span:Fair  Recall:Fair  Fund of Knowledge:Fair  Language:Fair   Psychomotor Activity  Psychomotor Activity:  Decreased   Assets  Assets:Communication Skills; Desire for Improvement; Housing; Physical Health; Resilience; Social Support   Sleep  Sleep:  Fair   Physical Exam: Physical Exam Vitals and nursing note reviewed.  Constitutional:      Appearance: Normal appearance.  HENT:     Head: Normocephalic.     Nose: Nose normal.  Cardiovascular:     Rate and Rhythm: Normal rate.     Pulses: Normal pulses.  Pulmonary:     Effort: Pulmonary effort is normal.  Musculoskeletal:        General: Normal range of motion.     Cervical back: Normal range of motion.  Neurological:     General: No focal deficit present.     Mental Status: He is alert and oriented to person, place, and time.  Psychiatric:        Attention and Perception: Attention normal.        Mood and Affect: Mood is anxious and depressed. Affect is angry.        Speech: Speech is rapid and pressured.        Behavior: Behavior is agitated. Behavior is cooperative.        Thought Content: Thought content includes homicidal and suicidal ideation.        Cognition and Memory: Cognition is impaired.        Judgment: Judgment is impulsive and inappropriate.   Review of Systems   Psychiatric/Behavioral:  Positive for depression and suicidal ideas. The patient is nervous/anxious and has insomnia.   All other systems reviewed and are negative. Blood pressure 138/86, pulse 68, temperature 98.2 F (36.8 C), temperature source Oral, resp. rate 16, height  (1.626 m), weight 72.6 kg, SpO2 98 %. Body mass index is 27.46 kg/m.  Treatment Plan Summary: Alcohol use disorder: Ativan alcohol detox protocol started  Major depressive disorder, severe, without psychosis: -Restarted Wellbutrin 300 mg daily  Insomnia: -Restarted Trazodone 100 mg daily at bedtime  HTN: -Restarted lisinopril 5 mg daily  Disposition: Admit to [facility] in [facility], NP 07/12/2020 2:33 PM

## 2020-07-12 NOTE — Progress Notes (Signed)
Pt denies any withdrawal symptoms tonight. Pt said that he is interested in doing a 30-90 day rehab program. Pt said that he was using his paycheck to buy a lot of alcohol, sometimes he would consume an 18 pack/day. He is interested in getting sober and taking parenting classes so he can see his 63.33 year old daughter. Pt said that he is working on attending [facility] and was encouraged to talk to his Child psychotherapist in the morning for assistance. Pt attended and participated in group last night. Pt denies SI/HI and AVH. Active listening, reassurance, and support provided. Medications administered as ordered by provider. Q 15 min safety checks continue. Pt's safety has been maintained.   07/12/20 2115  Psych Admission Type (Psych Patients Only)  Admission Status Voluntary  Psychosocial Assessment  Patient Complaints Anxiety;Depression;Sadness;Substance abuse  Eye Contact Fair  Facial Expression Anxious  Affect Anxious;Appropriate to circumstance;Depressed  Speech Logical/coherent;Pressured;Rapid;Slurred  Interaction Assertive  Motor Activity Fidgety  Appearance/Hygiene Unremarkable  Behavior Characteristics Cooperative;Appropriate to situation;Anxious;Fidgety  Mood Depressed;Anxious;Pleasant  Thought Process  Coherency WDL  Content Blaming others  Delusions None reported or observed  Perception WDL  Hallucination None reported or observed  Judgment Impaired  Confusion None  Danger to Self  Current suicidal ideation? Denies  Danger to Others  Danger to Others None reported or observed

## 2020-07-12 NOTE — Progress Notes (Signed)
Patient is a 35 year old male who presented today under voluntary admission from [facility] . Pt initially presented to [facility] "wanting to get help" for his drinking, and had made suicidal statements, which pt explained was an attempt to "get help". Pt stated that he's been drinking "on and off for several years", and when asked regarding the amount, he stated, "I drink till I pass out depends on how much money I have". Pt presented today as disheveled, anxious, stuttering at times during admission interview. Pt's behavior was calm and cooperative during assessment. Pt denied SI/HI and A/VH, and didn't seem to be responding to internal stimuli. Admission paperwork completed and signed. VS obtained and recorded. Skin assessment revealed no abnormalities. Belongings searched and secured in locker. Patient oriented to the unit and provided with dinner and po fluids. Q 15 min checks initiated for safety.

## 2020-07-12 NOTE — ED Notes (Signed)
Patient up early shower and oral hygiene supplies provided. 

## 2020-07-12 NOTE — ED Notes (Signed)
Report given to valerie rn

## 2020-07-12 NOTE — ED Notes (Signed)
Pt given lunch tray.

## 2020-07-12 NOTE — BH Assessment (Signed)
Patient has been accepted to [facility].  Patient assigned to room 307-1 Accepting physician is Dr. Jola Babinski.  Call report to [phone].  Representative was Annah Jasko (TTS).   ER Staff is aware of it:  French Ana, ER Secretary  Dr. Roxan Hockey, ER MD  Lattie Corns, Patient's Nurse

## 2020-07-12 NOTE — Progress Notes (Signed)
[facility] Group Notes:  (Nursing/MHT/Case Management/Adjunct)  Date:  07/12/2020  Time:  2015  Type of Therapy:   wrap up group  Participation Level:  Active  Participation Quality:  Appropriate, Attentive, Sharing, and Supportive  Affect:  Appropriate  Cognitive:  Alert  Insight:  Improving  Engagement in Group:  Engaged  Modes of Intervention:  Clarification, Education, and Support  Summary of Progress/Problems: Positive thinking and positive change were discussed.   Joshua [name] 07/12/2020, 8:47 PM

## 2020-07-12 NOTE — Tx Team (Signed)
Initial Treatment Plan 07/12/2020 8:24 PM Arkel Cartwright [facility]:[id number]    PATIENT STRESSORS: Financial difficulties Marital or family conflict Substance abuse   PATIENT STRENGTHS: Capable of independent living Communication skills Physical Health Supportive family/friends   PATIENT IDENTIFIED PROBLEMS:  "Suicidal ideation"     " I want help"     "I drink till I pass out"             DISCHARGE CRITERIA:  Improved stabilization in mood, thinking, and/or behavior Reduction of life-threatening or endangering symptoms to within safe limits Verbal commitment to aftercare and medication compliance Withdrawal symptoms are absent or subacute and managed without 24-hour nursing intervention  PRELIMINARY DISCHARGE PLAN: Attend 12-step recovery group Outpatient therapy  PATIENT/FAMILY INVOLVEMENT: This treatment plan has been presented to and reviewed with the patient, Keevin Panebianco,  The patient has been given the opportunity to ask questions and make suggestions.  Shela Nevin, RN 07/12/2020, 8:24 PM

## 2020-07-12 NOTE — ED Provider Notes (Signed)
Emergency Medicine Observation Re-evaluation Note  Joshua [name] is a 35 y.o. male, seen on rounds today.  Pt initially presented to the ED for complaints of Suicidal and Addiction Problem Currently, the patient is resting.  Physical Exam  BP 138/88   Pulse 70   Temp 97.9 F (36.6 C) (Oral)   Resp 18   Ht 5\' 4"  (1.626 m)   Wt 72.6 kg   SpO2 98%   BMI 27.46 kg/m  Physical Exam General: nad Cardiac: well perfused Lungs: unlabored resp Psych: resting and calm  ED Course / MDM  EKG:   I have reviewed the labs performed to date as well as medications administered while in observation.  Recent changes in the last 24 hours include none.  Plan  Current plan is for psych eval. Patient is under full IVC at this time.   , MD 07/12/20 [phone]

## 2020-07-12 NOTE — ED Notes (Signed)
Safe transport here for patient. Patient given his clothes and walked out to car located in front of emegency room entrance.

## 2020-07-13 DIAGNOSIS — F1994 Other psychoactive substance use, unspecified with psychoactive substance-induced mood disorder: Secondary | ICD-10-CM

## 2020-07-13 DIAGNOSIS — F1024 Alcohol dependence with alcohol-induced mood disorder: Secondary | ICD-10-CM

## 2020-07-13 DIAGNOSIS — F10230 Alcohol dependence with withdrawal, uncomplicated: Secondary | ICD-10-CM

## 2020-07-13 NOTE — [facility] Group Notes (Signed)
[facility] LCSW Group Therapy Note  07/13/2020    Type of Therapy and Topic:  Group Therapy:  Adding Supports Including Yourself  Participation Level:  Minimal   Description of Group:   Patients in this group were introduced to the concept that additional supports including self-support are an essential part of recovery.  Patients listed their current healthy and unhealthy supports, and discussed the difference between the two.   Several songs were played and a group discussion ensued in which patients stated they could relate to the songs which inspired them to realize they have be willing to help themselves in order to succeed, because other people cannot achieve sobriety or stability for them.  Parents were encouraged toward self-advocacy and self-support as part of their recovery.  They discussed their reactions to these songs' messages, which were positive and hopeful.  Before group ended, they identified the supports they believe they need to add to their lives to achieve their goals at discharge.   Therapeutic Goals: 1)  explain the difference between healthy and unhealthy supports and discuss what specific supports are currently in patients' lives 2)  demonstrate the importance of being a key part of one's own support system 3)  discuss the need for appropriate boundaries with supports 4)  elicit ideas from patients about supports that need to be added in order to achieve goals   Summary of Patient Progress:   The patient listed current healthy supports as his daughter and friends and current unhealthy supports as his ex-girlfriend and her parents.  He was not very interested in group and engaged in snickering/whispering with another patient.  However, when he was called on, he would respond with appropriate answers.  Therapeutic Modalities:   Motivational Interviewing Activity  Lynnell Chad

## 2020-07-13 NOTE — Progress Notes (Signed)
D:  Patient's self inventory sheet, patient has good sleep, sleep medication is helpful.  Good appetite, normal energy level, good concentration.  Denied depression and hopeless, anxiety #4.   Withdrawals, cravings.  Denied SI.   Denied physical problems.  Denied physical pain.  Goal is stop drinking so he can see his little girl  Plans to do whatever he needs to do.  No discharge plans. A:  Medications administered per MD orders.  Emotional support and encouragement given patient. R:  Denied SI and HI,  contracts for safety.  Denied A/V hallucinations.  Safety maintained with 15 minute checks.

## 2020-07-13 NOTE — H&P (Signed)
Psychiatric Admission Assessment Adult  Patient Identification: Joshua [name] MRN:  [id number] Date of Evaluation:  07/13/2020 Chief Complaint:  Major depressive disorder, recurrent severe without psychotic features (HCC) [F33.2] Principal Diagnosis: <principal problem not specified> Diagnosis:  Active Problems:   Major depressive disorder, recurrent severe without psychotic features (HCC)  History of Present Illness: Patient is seen and examined.  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  The patient stated at that time that he was suicidal.  He admitted that he had attempted to harm himself in the past by hanging.  Psychiatric consultation was obtained, and the patient stated that he wanted to kill himself.  He stated he was unable to see his daughter because of DSS, and also his previous significant other had placed a restraining order on him.  His blood alcohol on evaluation was 251.  On examination today the patient was unable to quantitate the amount of alcohol that he had been drinking.  On examination today the patient stated that this would be his third psychiatric hospitalization.  He had been hospitalized on similar circumstances back in March 2022.  He was hospitalized there for 5 days.  He was discharged on bupropion, hydroxyzine, lisinopril, naltrexone and trazodone.  On examination today the patient stated that he came into the hospital because he has a hearing with DSS on 7/18.  He stated he had spoken with social services last week sometime, and they made recommendations for substance residential treatment.  He stated he wanted to be able to see his daughter, and the only way he was going to be able to do that was to  get into a residential treatment program and get sober.  Review of the electronic medical record revealed that the patient had been offered residential treatment when he had been hospitalized at [facility] in March, but had declined it at that time.  He currently denies suicidal ideation.  He stated he had been compliant with the bupropion and his other medications since discharge and had been seen apparently at [facility].  Reconsultation was done on 07/12/2020.  He was transferred to our facility for evaluation and stabilization.  Associated Signs/Symptoms: Depression Symptoms:  depressed mood, anhedonia, insomnia, psychomotor agitation, fatigue, feelings of worthlessness/guilt, difficulty concentrating, hopelessness, suicidal thoughts without plan, anxiety, loss of energy/fatigue, disturbed sleep, Duration of Depression Symptoms: Greater than two weeks  (Hypo) Manic Symptoms:  Impulsivity, Irritable Mood, Labiality of Mood, Anxiety Symptoms:  Excessive Worry, Psychotic Symptoms:   Denied PTSD Symptoms: Negative Total Time spent with patient: 30 minutes  Past Psychiatric History: Patient reports that this is his third psychiatric hospitalization.  His last was at [facility] in March of this year.  He has been diagnosed with depression, substance dependence, attention deficit hyperactivity disorder.  He reported that he has not been treated in a residential substance abuse treatment program in the past.  Is the patient at risk to self? Yes.    Has the patient been a risk to self in the past 6 months? Yes.    Has the patient been a risk to self within the distant past? No.  Is the patient a risk to others? No.  Has the patient been a risk to others in the past 6 months? No.  Has the  patient been a risk to others within the distant past? No.   Prior Inpatient Therapy:   Prior Outpatient Therapy:    Alcohol Screening:   Substance Abuse History in the last 12 months:   Yes.   Consequences of Substance Abuse: Almost completely the cause of this hospitalization Previous Psychotropic Medications: Yes  Psychological Evaluations: Yes  Past Medical History:  Past Medical History:  Diagnosis Date   ADD (attention deficit disorder)    Depression    Paranoid (HCC)     Past Surgical History:  Procedure Laterality Date   HERNIA REPAIR     Family History: History reviewed. No pertinent family history. Family Psychiatric  History: He stated that he had a family member with Alzheimer's disease Tobacco Screening:   Social History:  Social History   Substance and Sexual Activity  Alcohol Use Yes   Comment: occassional 24oz beer at night     Social History   Substance and Sexual Activity  Drug Use No    Additional Social History:                           Allergies:  No Known Allergies Lab Results: No results found for this or any previous visit (from the past 48 hour(s)).  Blood Alcohol level:  Lab Results  Component Value Date   ETH 251 (H) 07/10/2020   ETH 143 (H) 04/01/2020    Metabolic Disorder Labs:  No results found for: HGBA1C, MPG No results found for: PROLACTIN No results found for: CHOL, TRIG, HDL, CHOLHDL, VLDL, LDLCALC  Current Medications: Current Facility-Administered Medications  Medication Dose Route Frequency Provider Last Rate Last Admin   buPROPion (WELLBUTRIN XL) 24 hr tablet 300 mg  300 mg Oral Daily Shaune PollackLord, Jamison Y, NP   300 mg at 07/13/20 0900   feeding supplement (ENSURE ENLIVE / ENSURE PLUS) liquid 237 mL  237 mL Oral BID BM Antonieta Pertlary, Whitleigh Garramone Lawson, MD   237 mL at 07/13/20 0920   hydrOXYzine (ATARAX/VISTARIL) tablet 25 mg  25 mg Oral Q6H PRN Charm RingsLord, Jamison Y, NP   25 mg at 07/12/20 2113   lisinopril (ZESTRIL) tablet 5 mg  5 mg Oral Daily Charm RingsLord, Jamison Y, NP   5 mg at 07/13/20 0900   loperamide (IMODIUM) capsule 2-4 mg  2-4 mg Oral PRN Charm RingsLord, Jamison Y, NP       LORazepam (ATIVAN) tablet 1 mg  1 mg Oral Q6H PRN  Charm RingsLord, Jamison Y, NP       multivitamin with minerals tablet 1 tablet  1 tablet Oral Daily Charm RingsLord, Jamison Y, NP   1 tablet at 07/13/20 0900   ondansetron (ZOFRAN-ODT) disintegrating tablet 4 mg  4 mg Oral Q6H PRN Charm RingsLord, Jamison Y, NP       thiamine tablet 100 mg  100 mg Oral Daily Charm RingsLord, Jamison Y, NP   100 mg at 07/13/20 0900   traZODone (DESYREL) tablet 100 mg  100 mg Oral QHS Charm RingsLord, Jamison Y, NP   100 mg at 07/12/20 2113   PTA Medications: Medications Prior to Admission  Medication Sig Dispense Refill Last Dose   buPROPion (WELLBUTRIN XL) 150 MG 24 hr tablet TAKE 1 TABLET (150 MG TOTAL) BY MOUTH DAILY. (Patient not taking: No sig reported) 7 tablet 0    buPROPion (WELLBUTRIN XL) 300 MG 24 hr tablet Take 1 tablet (300 mg total) by mouth in the morning. 30 tablet 1    hydrOXYzine (ATARAX/VISTARIL) 50  MG tablet Take 1 tablet (50 mg total) by mouth 2 (two) times daily as needed. 60 tablet 1    ibuprofen (ADVIL) 200 MG tablet Take 200 mg by mouth every 6 (six) hours as needed for mild pain, moderate pain or headache.      lisinopril (ZESTRIL) 5 MG tablet TAKE 1 TABLET (5 MG TOTAL) BY MOUTH DAILY. 7 tablet 0    naltrexone (DEPADE) 50 MG tablet TAKE 1 TABLET (50 MG TOTAL) BY MOUTH DAILY. (Patient not taking: Reported on 07/11/2020) 7 tablet 0    traZODone (DESYREL) 100 MG tablet Take 1 tablet (100 mg total) by mouth at bedtime as needed. 30 tablet 1     Musculoskeletal: Strength & Muscle Tone: within normal limits Gait & Station: normal Patient leans: N/A            Psychiatric Specialty Exam:  Presentation  General Appearance: Fairly Groomed  Eye Contact:Fair  Speech:Normal Rate  Speech Volume:Normal  Handedness:Right   Mood and Affect  Mood:Anxious  Affect:Congruent   Thought Process  Thought Processes:Coherent  Duration of Psychotic Symptoms: No data recorded Past Diagnosis of Schizophrenia or Psychoactive disorder: No  Descriptions of  Associations:Intact  Orientation:Full (Time, Place and Person)  Thought Content:Logical  Hallucinations:Hallucinations: None  Ideas of Reference:None  Suicidal Thoughts:Suicidal Thoughts: Yes, Passive SI Passive Intent and/or Plan: Without Intent  Homicidal Thoughts:Homicidal Thoughts: No   Sensorium  Memory:Immediate Fair; Recent Fair; Remote Fair  Judgment:Intact  Insight:Lacking   Executive Functions  Concentration:Fair  Attention Span:Fair  Recall:Fair  Fund of Knowledge:Fair  Language:Fair   Psychomotor Activity  Psychomotor Activity:Psychomotor Activity: Normal   Assets  Assets:Desire for Improvement; Resilience   Sleep  Sleep:Sleep: Good Number of Hours of Sleep: 7.25    Physical Exam: Physical Exam Vitals and nursing note reviewed.  Constitutional:      Appearance: Normal appearance.  HENT:     Head: Normocephalic and atraumatic.  Pulmonary:     Effort: Pulmonary effort is normal.  Neurological:     General: No focal deficit present.     Mental Status: He is alert and oriented to person, place, and time.   Review of Systems  All other systems reviewed and are negative. Blood pressure (!) 147/69, pulse 63, temperature 97.8 F (36.6 C), temperature source Oral, resp. rate 16, height 5\' 4"  (1.626 m), weight 70.3 kg, SpO2 100 %. Body mass index is 26.61 kg/m.  Treatment Plan Summary: Patient is seen and examined.  Patient is a 35 year old male with the above-stated past psychiatric history who was transferred to our facility for treatment of the alcohol dependence, alcohol withdrawal and depressive symptoms.  He will be admitted to the hospital.  He will be integrated in the milieu.  He will be encouraged to attend groups.  We will continue his current medications including Wellbutrin, hydroxyzine, lisinopril, lorazepam, Zofran and trazodone.  A standing lorazepam dosage had been ordered, but I did stop that.  The patient has no history of  seizures or complicated withdrawal symptoms.  He stated that earlier within the last 6 weeks he had been sober for 2 to 3 days, had gotten anxious, somewhat tremulous, but no hallucinations or complicated withdrawal symptoms.  He is interested in getting into a residential substance abuse treatment program, and I have recommended that he discuss this with social work.  We will have to see what his options are.  His hearing with DSS is on 7/18, and he is highly motivated to get into treatment  prior to that.  Review of his admission laboratories revealed normal electrolytes including creatinine and liver function enzymes.  His CBC was normal including his platelets.  MCV was normal.  Respiratory panel was negative for influenza A, B and coronavirus.  Urinalysis was essentially normal.  Blood alcohol on admission was 251.  Drug screen was completely negative.  His vital signs are stable, he is afebrile.  Pulse oximetry on room air was 100%.  He slept 6.25 hours last night.  His most recent CIWA this morning was only 1.  Observation Level/Precautions:  Detox 15 minute checks  Laboratory:  CBC Chemistry Profile HbAIC UDS UA  Psychotherapy:    Medications:    Consultations:    Discharge Concerns:    Estimated LOS:  Other:     Physician Treatment Plan for Primary Diagnosis: <principal problem not specified> Long Term Goal(s): Improvement in symptoms so as ready for discharge  Short Term Goals: Ability to identify changes in lifestyle to reduce recurrence of condition will improve, Ability to verbalize feelings will improve, Ability to disclose and discuss suicidal ideas, Ability to demonstrate self-control will improve, Ability to identify and develop effective coping behaviors will improve, Ability to maintain clinical measurements within normal limits will improve, and Ability to identify triggers associated with substance abuse/mental health issues will improve  Physician Treatment Plan for  Secondary Diagnosis: Active Problems:   Major depressive disorder, recurrent severe without psychotic features (HCC)  Long Term Goal(s): Improvement in symptoms so as ready for discharge  Short Term Goals: Ability to identify changes in lifestyle to reduce recurrence of condition will improve, Ability to verbalize feelings will improve, Ability to disclose and discuss suicidal ideas, Ability to demonstrate self-control will improve, Ability to identify and develop effective coping behaviors will improve, Ability to maintain clinical measurements within normal limits will improve, and Ability to identify triggers associated with substance abuse/mental health issues will improve  I certify that inpatient services furnished can reasonably be expected to improve the patient's condition.    Antonieta Pert, MD 7/3/202212:53 PM

## 2020-07-13 NOTE — [facility] Suicide Risk Assessment (Signed)
[facility] Admission Suicide Risk Assessment   Nursing information obtained from:  Patient Demographic factors:  Male, Caucasian, Low socioeconomic status Current Mental Status:  Suicidal ideation indicated by others Loss Factors:  Loss of significant relationship, Financial problems / change in socioeconomic status Historical Factors:  Prior suicide attempts Risk Reduction Factors:  Responsible for children under 33 years of age, Positive social support  Total Time spent with patient: 30 minutes Principal Problem: <principal problem not specified> Diagnosis:  Active Problems:   Major depressive disorder, recurrent severe without psychotic features (HCC)  Subjective Data: Patient is seen and examined.  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  The patient stated at that time that he was suicidal.  He admitted that he had attempted to harm himself in the past by hanging.  Psychiatric consultation was obtained, and the patient stated that he wanted to kill himself.  He stated he was unable to see his daughter because of DSS, and also his previous significant other had placed a restraining order on him.  His blood alcohol on evaluation was 251.  On examination today the patient was unable to quantitate the amount of alcohol that he had been drinking.  On examination today the patient stated that this would be his third psychiatric hospitalization.  He had been hospitalized on similar circumstances back in March 2022.  He was hospitalized there for 5 days.  He was discharged on bupropion, hydroxyzine, lisinopril, naltrexone and trazodone.  On examination today the patient stated that he came into the hospital because he has a hearing with  DSS on 7/18.  He stated he had spoken with social services last week sometime, and they made recommendations for substance residential treatment.  He stated he wanted to be able to see his daughter, and the only way he was going to be able to do that was to get into a residential treatment program and get sober.  Review of the electronic medical record revealed that the patient had been offered residential treatment when he had been hospitalized at [facility] in March, but had declined it at that time.  He currently denies suicidal ideation.  He stated he had been compliant with the bupropion and his other medications since discharge and had been seen apparently at [facility].  Reconsultation was done on 07/12/2020.  He was transferred to our facility for evaluation and stabilization.  Continued Clinical Symptoms:    The "Alcohol Use Disorders Identification Test", Guidelines for Use in Primary Care, Second Edition.  World Science writer [facility]). Score between 0-7:  no or low risk or alcohol related problems. Score between 8-15:  moderate risk of alcohol related problems. Score between 16-19:  high risk of alcohol related problems. Score 20 or above:  warrants further diagnostic evaluation for alcohol dependence and treatment.   CLINICAL FACTORS:   Depression:   Anhedonia Comorbid alcohol abuse/dependence Hopelessness Impulsivity Insomnia Alcohol/Substance Abuse/Dependencies   Musculoskeletal: Strength & Muscle Tone: within normal limits Gait & Station: normal Patient leans: N/A  Psychiatric Specialty Exam:  Presentation  General Appearance: Fairly Groomed  Eye Contact:Fair  Speech:Normal Rate  Speech Volume:Normal  Handedness:Right   Mood and Affect  Mood:Anxious  Affect:Congruent   Thought Process  Thought Processes:Coherent  Descriptions of Associations:Intact  Orientation:Full (Time, Place and Person)  Thought Content:Logical  History of Schizophrenia/Schizoaffective  disorder:No  Duration of Psychotic Symptoms:No data recorded Hallucinations:Hallucinations: None  Ideas of Reference:None  Suicidal Thoughts:Suicidal Thoughts: Yes, Passive SI Passive Intent and/or Plan: Without Intent  Homicidal Thoughts:Homicidal Thoughts: No   Sensorium  Memory:Immediate Fair; Recent Fair; Remote Fair  Judgment:Intact  Insight:Lacking   Executive Functions  Concentration:Fair  Attention Span:Fair  Recall:Fair  Fund of Knowledge:Fair  Language:Fair   Psychomotor Activity  Psychomotor Activity:Psychomotor Activity: Normal   Assets  Assets:Desire for Improvement; Resilience   Sleep  Sleep:Sleep: Good Number of Hours of Sleep: 7.25    Physical Exam: Physical Exam Vitals and nursing note reviewed.  HENT:     Head: Normocephalic and atraumatic.  Pulmonary:     Effort: Pulmonary effort is normal.  Neurological:     General: No focal deficit present.     Mental Status: He is alert and oriented to person, place, and time.   Review of Systems  All other systems reviewed and are negative. Blood pressure 124/84, pulse 60, temperature 97.8 F (36.6 C), temperature source Oral, resp. rate 16, height 5\' 4"  (1.626 m), weight 70.3 kg, SpO2 100 %. Body mass index is 26.61 kg/m.   COGNITIVE FEATURES THAT CONTRIBUTE TO RISK:  None    SUICIDE RISK:   Mild:  Suicidal ideation of limited frequency, intensity, duration, and specificity.  There are no identifiable plans, no associated intent, mild dysphoria and related symptoms, good self-control (both objective and subjective assessment), few other risk factors, and identifiable protective factors, including available and accessible social support.  PLAN OF CARE: Patient is seen and examined.  Patient is a 35 year old male with the above-stated past psychiatric history who was transferred to our facility for treatment of the alcohol dependence, alcohol withdrawal and depressive symptoms.  He will be  admitted to the hospital.  He will be integrated in the milieu.  He will be encouraged to attend groups.  We will continue his current medications including Wellbutrin, hydroxyzine, lisinopril, lorazepam, Zofran and trazodone.  A standing lorazepam dosage had been ordered, but I did stop that.  The patient has no history of seizures or complicated withdrawal symptoms.  He stated that earlier within the last 6 weeks he had been sober for 2 to 3 days, had gotten anxious, somewhat tremulous, but no hallucinations or complicated withdrawal symptoms.  He is interested in getting into a residential substance abuse treatment program, and I have recommended that he discuss this with social work.  We will have to see what his options are.  His hearing with DSS is on 7/18, and he is highly motivated to get into treatment prior to that.  Review of his admission laboratories revealed normal electrolytes including creatinine and liver function enzymes.  His CBC was normal including his platelets.  MCV was normal.  Respiratory panel was negative for influenza A, B and coronavirus.  Urinalysis was essentially normal.  Blood alcohol on admission was 251.  Drug screen was completely negative.  His vital signs are stable, he is afebrile.  Pulse oximetry on room air was 100%.  He slept 6.25 hours last night.  His most recent CIWA this morning was only 1.  I certify that inpatient services furnished can reasonably be expected to improve the patient's condition.   8/18, MD 07/13/2020, 9:36 AM

## 2020-07-13 NOTE — [facility] Counselor (Signed)
Adult Comprehensive Assessment  Patient ID: Joshua [name], male   DOB: 04/04/1985, 35 y.o.   MRN: [id number]  Information Source: Information source: Patient  Current Stressors:  Patient states their primary concerns and needs for treatment are:: Told [facility] was going to hurt himself. Patient states their goals for this hospitilization and ongoing recovery are:: Try to get the help he needs. Educational / Learning stressors: Denies stressors Employment / Job issues: Was working 2 jobs, quit one of them because he did not need to be doing it. Family Relationships: Has only seen 1-1/2yo daughter once in the last year.  Has a court date on July 13th about her custody.  Has been unable to get in touch with [facility].  States he is supposed to take anger management classes, parenting classes, and go to substance abuse rehab. Financial / Lack of resources (include bankruptcy): Too stressful to deal with. Housing / Lack of housing: Trying to find something is stressful. Physical health (include injuries & life threatening diseases): Denies stressors, but does say he has a messed-up shoulder, gets painful. Social relationships: Knowing his daughter is with his ex-fiancee is stressful. Substance abuse: Alcohol is a problem - needs to go to rehab, per what he has told DSS, is interested in [facility], does not Bereavement / Loss: Grandmother  Living/Environment/Situation:  Living Arrangements: Non-relatives/Friends Living conditions (as described by patient or guardian): Camper, no electricity, gets electricity from the friends' house. Who else lives in the home?: Friends are in the house, and he stays in the camper in their yard. How long has patient lived in current situation?: 1 year What is atmosphere in current home: Comfortable  Family History:  Marital status: Single Are you sexually active?: No What is your sexual orientation?: Heterosexual How many children?: 1 How is patient's  relationship with their children?: Patient is currently unable to see his daughter who is 58-1/2 years old.  They are supposed to go to court 7/13 and there are some things he is supposed to do to get to see her.  Childhood History:  By whom was/is the patient raised?: Both parents Additional childhood history information: Parents divorced when pt was in eighth grade. Description of patient's relationship with caregiver when they were a child: "Hit and miss at times." Patient's description of current relationship with people who raised him/her: Does not talk to his parents. How were you disciplined when you got in trouble as a child/adolescent?: "Things taken away from me, whooped, things like that." Does patient have siblings?: Yes Number of Siblings: 2 Description of patient's current relationship with siblings: He describes his oldest brother as the golden child. He states that he wants to "beat the hell ouf of" his twin.  Does not talk to his brothers. Did patient suffer any verbal/emotional/physical/sexual abuse as a child?: No Did patient suffer from severe childhood neglect?: No Has patient ever been sexually abused/assaulted/raped as an adolescent or adult?: No Was the patient ever a victim of a crime or a disaster?: No Witnessed domestic violence?: No Has patient been affected by domestic violence as an adult?: No  Education:  Highest grade of school patient has completed: Graduated high school Currently a student?: No Learning disability?: Yes What learning problems does patient have?: ADHD  Employment/Work Situation:   Employment Situation: Employed Where is Patient Currently Employed?: [employer] How Long has Patient Been Employed?: Since October 2021 Are You Satisfied With Your Job?: Yes Do You Work  More Than One Job?: No Work Stressors: Does not make enough money. Patient's Job has Been Impacted by Current Illness: Yes Describe how  Patient's Job has Been Impacted: Will be irritability if he wakes up with a hangover. What is the Longest Time Patient has Held a Job?: "Little over a year" Where was the Patient Employed at that Time?: "At a couple of jobs" Has Patient ever Been in the [employer]?: No  Financial Resources:   Financial resources: Income from employment Does patient have a representative payee or guardian?: No  Alcohol/Substance Abuse:   What has been your use of drugs/alcohol within the last 12 months?: Alcohol (use depends on money, but usually it is every day); was using powder cocaine "pretty hard" but quit a couple of weeks ago If attempted suicide, did drugs/alcohol play a role in this?: Yes (Was drinking when suicidal) Alcohol/Substance Abuse Treatment Hx: Past Tx, Inpatient If yes, describe treatment: [facility] in March 2022. Has alcohol/substance abuse ever caused legal problems?: No  Social Support System:   Forensic psychologist System: Poor Describe Community Support System: Friends Type of faith/religion: None How does patient's faith help to cope with current illness?: N/A  Leisure/Recreation:   Do You Have Hobbies?: No  Strengths/Needs:   What is the patient's perception of their strengths?: Working, trying to be a good dad Patient states they can use these personal strengths during their treatment to contribute to their recovery: "Put my daughter first." Patient states these barriers may affect/interfere with their treatment: None Patient states these barriers may affect their return to the community: None Other important information patient would like considered in planning for their treatment: None  Discharge Plan:   Currently receiving community mental health services: Yes (From Whom) (Dr. Georjean Mode at [facility] has an appt 8/3; were talking about setting up therapy) Patient states concerns and preferences for aftercare planning are: Return to Dr. Georjean Mode and set up therapy.  Is  interested in going to rehab - has two upcoming court dates (one for custody and one for no tags on vehicle, but attorney can take care of getting postponements for those).  Is not worried about work. Patient states they will know when they are safe and ready for discharge when: Hard to answer right now. Does patient have access to transportation?: No Does patient have financial barriers related to discharge medications?: Yes Patient description of barriers related to discharge medications: Has no insurance and minimal income Plan for no access to transportation at discharge: CSW to continue assessing, does not know if a friend can pick him up. Will patient be returning to same living situation after discharge?: Yes  Summary/Recommendations:   Summary and Recommendations (to be completed by the evaluator): Patient is a 35yo male hospitalized under IVC with suicidal ideation while under the influence of substances, saying "I want to kill myself."  Primary stressors include not being allowed to see his 1-1/2yo daughter for the last year and an upcoming court date 7/13 about that, as well as being told by DSS he needs to complete parenting classes/anger management classes/substance abuse rehab, but he cannot make the arrangements himself and has been unsuccessful in getting help.  He has a low-paying job and needs more income to provide for his daughter and himself.  He drinks alcohol almost daily and until 2 weeks ago was using powder cocaine frequently.  He has been going to [facility] to see psychiatrist Dr. Georjean Mode and Margaretmary Lombard at that agency is supposed to be  arranging therapy.  He lives in a camper on the property of his friends and will return there at discharge, is not sure how he will get home at discharge.  He feels strongly (100%) that if he discharges prior to going to rehab, he will relapse.  Patient would benefit from crisis stabilization, group therapy, medication management, psychoeducation, peer  interaction and discharge planning.  At discharge it is recommended that the patient adhere to the established aftercare plan.  Lynnell Chad. 07/13/2020

## 2020-07-13 NOTE — Progress Notes (Signed)
Adult Psychoeducational Group Note  Date:  07/13/2020 Time:  8:55 PM  Group Topic/Focus:  Wrap-Up Group:   The focus of this group is to help patients review their daily goal of treatment and discuss progress on daily workbooks.  Participation Level:  Minimal  Participation Quality:  Appropriate  Affect:  Appropriate  Cognitive:  Oriented  Insight: Appropriate  Engagement in Group:  Engaged  Modes of Intervention:  Education  Additional Comments:  Patient attended and participated in group tonight. He reports that the most significant thing that happen today was being able to socialize with peers  Scot Dock 07/13/2020, 8:55 PM

## 2020-07-13 NOTE — Progress Notes (Addendum)
Nurse discussed anxiety, depression and coping skills with patient.  

## 2020-07-14 LAB — TSH: TSH: 3.632 u[IU]/mL (ref 0.350–4.500)

## 2020-07-14 LAB — LIPID PANEL
Cholesterol: 188 mg/dL (ref 0–200)
HDL: 45 mg/dL (ref >40)
LDL Cholesterol: UNDETERMINED mg/dL (ref 0–99)
Total CHOL/HDL Ratio: 4.2 RATIO
Triglycerides: 591 mg/dL — ABNORMAL HIGH (ref <150)
VLDL: UNDETERMINED mg/dL (ref 0–40)

## 2020-07-14 LAB — LDL CHOLESTEROL, DIRECT: Direct LDL: 90.6 mg/dL (ref 0–99)

## 2020-07-14 LAB — T4, FREE: Free T4: 0.74 ng/dL (ref 0.61–1.12)

## 2020-07-14 MED ORDER — PANTOPRAZOLE SODIUM 20 MG PO TBEC
20.0000 mg | DELAYED_RELEASE_TABLET | Freq: Every day | ORAL | Status: DC
  Administered 2020-07-14 – 2020-07-15 (×2): 20 mg via ORAL
  Filled 2020-07-14 (×5): qty 1
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
## 2020-07-14 NOTE — Plan of Care (Signed)
Nurse discussed anxiety, depression and coping skills with patient.  

## 2020-07-14 NOTE — Progress Notes (Signed)
Patient did not attend morning orientation group.  

## 2020-07-14 NOTE — Progress Notes (Signed)
Recreation Therapy Notes  Date: 07/14/2020 Time: 930a  Topic: Leisure Participation   Goal Area(s) Addresses:  Patient will complete packet of holiday-theme worksheets, as a healthy free-time activity on unit.  Intervention: Independent Leisure   Education: Leisure Exposure- Word puzzles and [employer]   Comments: LRT provided pt a leisure packet in celebration of Independence Day. Pt given the option to complete the packet in the dayroom with [facility] staff and peers or at their own pace in their room.   Nicholos Johns Irmgard Rampersaud, LRT/CTRS  Benito Mccreedy Lamyra Malcolm 07/14/2020, 12:14 PM

## 2020-07-14 NOTE — Progress Notes (Signed)
The patient rated his day as a  6 out of 10 since he expects to be discharged tomorrow. He is complaining of not having enough rest but admits that his medication is being adjusted.

## 2020-07-14 NOTE — Progress Notes (Signed)
   07/14/20 2339  Psych Admission Type (Psych Patients Only)  Admission Status Voluntary  Psychosocial Assessment  Patient Complaints Anxiety;Insomnia  Eye Contact Fair  Facial Expression Anxious  Affect Anxious  Speech Logical/coherent  Interaction Assertive  Motor Activity Fidgety  Appearance/Hygiene Unremarkable  Behavior Characteristics Cooperative;Anxious  Mood Anxious  Thought Process  Coherency WDL  Content WDL  Delusions None reported or observed  Perception WDL  Hallucination None reported or observed  Judgment Impaired  Confusion None  Danger to Self  Current suicidal ideation? Denies  Danger to Others  Danger to Others None reported or observed  Danger to Others Abnormal  Harmful Behavior to others No threats or harm toward other people  Destructive Behavior No threats or harm toward property  D: Patient in dayroom reports he had a good day but his only concern is not able to sleep. Pt reports no n/v this evening. A: Medications administered as prescribed. Support and encouragement provided as needed.  R: Patient remains safe on the unit. Will continue to monitor for safety and stability.

## 2020-07-14 NOTE — Progress Notes (Signed)
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  Objective: Medical record reviewed.  Patient's case discussed in detail with members of the treatment team.  I met with and evaluated the patient on the unit today for follow-up.  Patient reports that he had worsening mood symptoms and suicidal ideation prior to admission in the context of daily alcohol use and an upcoming court hearing about custody of his daughter and that he came in to get into a program.  He states that he needs to complete anger management classes, parenting classes and a substance abuse treatment program for court purposes.  He would like referral to a substance abuse treatment program at the time of discharge.  Patient gives a history of daily alcohol use with consumption of between 12 and 18 beers daily for more than the past year.  His last drink was on Thursday, 07/10/2020.  He did have an episode of vomiting last night after he started coughing but states he believes this was due to heartburn.  He denies nausea.  He denies any vomiting today.  Patient denies any current alcohol withdrawal symptoms and he denies any history of withdrawal seizures or complicated withdrawal from alcohol in the past.  He denies any other recent substance use prior to admission.  He does have a history of cocaine use disorder but states he had not used cocaine for at least 2 weeks prior to admission.  Patient describes his current mood as agitated and states that he feels a little shaky.  Affect is stable and there is no observable tremor or diaphoresis on exam.  Thought processes are organized and there is no evidence of formal thought  disorder.  He denies any suicidal ideation today and states he has not had any since Thursday.  He reports chronic intermittent fleeting thoughts of harming his ex related to conflict over their daughter but denies any intent or plan to act on these thoughts.  He denies any history of physical violence in the past.  Patient denies PI, AH or VH.  He denies any medication side effects or physical problems.  BAL on 07/10/2020 at 9:24 PM was 251.  UDS was negative.  CMP revealed glucose of 110 and was otherwise WNL.  CBC was WNL.  No differential was performed.  No lipid panel, TFTs or hemoglobin A1c were performed.  Vital signs have been generally within normal limits over the past 24 hours with mildly elevated BP at times.  BP this morning was 141/88 with pulse of 68, respirations of 18, O2 sat of 100% on room air and temperature of 98.  Vital signs at noon today were BP of 138/88 and pulse of 66.  CIWA score last night at 10 PM was 10.  CIWA score today at noon was 1.  Patient has been visible in the day room and social with select peers.  Principal Problem: Major depressive disorder, recurrent severe without psychotic features ([place]) Diagnosis: Principal Problem:   Major depressive disorder, recurrent severe without psychotic features ([place]) Active Problems:   Alcohol use disorder, severe, dependence ([place])  Total Time spent with patient:  25 minutes  Past Psychiatric History: See admission H&P.  Patient has prior  psychiatric diagnoses of depression, substance dependence and attention deficit hyperactivity disorder.  Patient has been admitted to inpatient psychiatric hospitals to previous times.  His most recent admission was at [facility] in March 2022.  Patient states that he has never been treated in residential substance abuse treatment program in the past.  Patient reports a history of several past suicide attempts including an attempt to hang himself in March 2022 and several other  attempts during his lifetime where he attempted to walk into traffic.  He reports a remote history of nonsuicidal self-injurious behavior but has not engaged in this and in years.  Past Medical History:  Past Medical History:  Diagnosis Date   ADD (attention deficit disorder)    Depression    Paranoid ([place])     Past Surgical History:  Procedure Laterality Date   HERNIA REPAIR     Family History: History reviewed. No pertinent family history. Family Psychiatric  History: See admission H&P.  Patient reports there is a family member with Alzheimer's disease. Social History:  Social History   Substance and Sexual Activity  Alcohol Use Yes   Comment: occassional 24oz beer at night     Social History   Substance and Sexual Activity  Drug Use No    Social History   Socioeconomic History   Marital status: Single    Spouse name: Not on file   Number of children: Not on file   Years of education: Not on file   Highest education level: Not on file  Occupational History   Not on file  Tobacco Use   Smoking status: Every Day    Packs/day: 0.50    Pack years: 0.00    Types: Cigarettes   Smokeless tobacco: Never  Substance and Sexual Activity   Alcohol use: Yes    Comment: occassional 24oz beer at night   Drug use: No   Sexual activity: Not on file  Other Topics Concern   Not on file  Social History Narrative   Not on file   Social Determinants of Health   Financial Resource Strain: Not on file  Food Insecurity: Not on file  Transportation Needs: Not on file  Physical Activity: Not on file  Stress: Not on file  Social Connections: Not on file   Additional Social History:                         Sleep: Good  Appetite:  Fair  Current Medications: Current Facility-Administered Medications  Medication Dose Route Frequency Provider Last Rate Last Admin   buPROPion (WELLBUTRIN XL) 24 hr tablet 300 mg  300 mg Oral Daily Patrecia Pour, NP   300 mg at  07/14/20 0858   feeding supplement (ENSURE ENLIVE / ENSURE PLUS) liquid 237 mL  237 mL Oral BID BM Sharma Covert, MD   237 mL at 07/14/20 1100   hydrOXYzine (ATARAX/VISTARIL) tablet 25 mg  25 mg Oral Q6H PRN Patrecia Pour, NP   25 mg at 07/12/20 2113   lisinopril (ZESTRIL) tablet 5 mg  5 mg Oral Daily Patrecia Pour, NP   5 mg at 07/14/20 0858   loperamide (IMODIUM) capsule 2-4 mg  2-4 mg Oral PRN Patrecia Pour, NP       LORazepam (ATIVAN) tablet 1 mg  1 mg Oral Q6H PRN Patrecia Pour, NP   1 mg at 07/13/20 2126   multivitamin with minerals tablet 1 tablet  1 tablet Oral Daily Patrecia Pour, NP   1 tablet at 07/14/20 0859   ondansetron (ZOFRAN-ODT) disintegrating tablet 4 mg  4 mg Oral Q6H PRN Patrecia Pour, NP   4 mg at 07/14/20 0900   pantoprazole (PROTONIX) EC tablet 20 mg  20 mg Oral Daily Arthor Captain, MD   20 mg at 07/14/20 1209   thiamine tablet 100 mg  100 mg Oral Daily Patrecia Pour, NP   100 mg at 07/14/20 0859   traZODone (DESYREL) tablet 100 mg  100 mg Oral QHS Patrecia Pour, NP   100 mg at 07/13/20 2126    Lab Results: No results found for this or any previous visit (from the past 48 hour(s)).  Blood Alcohol level:  Lab Results  Component Value Date   ETH 251 (H) 07/10/2020   ETH 143 (H) 40/10/2723    Metabolic Disorder Labs: No results found for: HGBA1C, MPG No results found for: PROLACTIN No results found for: CHOL, TRIG, HDL, CHOLHDL, VLDL, LDLCALC  Physical Findings: AIMS:  , ,  ,  ,    CIWA:  CIWA-Ar Total: 1 COWS:     Musculoskeletal: Strength & Muscle Tone: within normal limits Gait & Station: normal Patient leans: N/A  Psychiatric Specialty Exam:  Presentation  General Appearance: Fairly Groomed  Eye Contact:Good  Speech:Normal Rate  Speech Volume:Normal  Handedness:Right   Mood and Affect  Mood:Anxious; Depressed; Irritable  Affect:Congruent   Thought Process  Thought Processes:Coherent  Descriptions of  Associations:Intact  Orientation:Full (Time, Place and Person)  Thought Content:Logical  History of Schizophrenia/Schizoaffective disorder:No  Duration of Psychotic Symptoms:No data recorded Hallucinations:Hallucinations: None  Ideas of Reference:None  Suicidal Thoughts:Suicidal Thoughts: No SI Passive Intent and/or Plan: Without Intent  Homicidal Thoughts:Homicidal Thoughts: No   Sensorium  Memory:Immediate Fair; Recent Fair; Remote Fair  Judgment:Fair  Insight:Shallow   Executive Functions  Concentration:Fair  Attention Span:Fair  [place]   Psychomotor Activity  Psychomotor Activity:Psychomotor Activity: Normal   Assets  Assets:Communication Skills; Desire for Improvement; Resilience   Sleep  Sleep:Sleep: Good Number of Hours of Sleep: 6.75    Physical Exam: Physical Exam Vitals and nursing note reviewed.  Constitutional:      General: He is not in acute distress.    Appearance: Normal appearance. He is not diaphoretic.  HENT:     Head: Normocephalic and atraumatic.  Pulmonary:     Effort: Pulmonary effort is normal.  Neurological:     General: No focal deficit present.     Mental Status: He is alert and oriented to person, place, and time.   Review of Systems  Constitutional:  Negative for chills, diaphoresis and fever.  HENT:  Negative for sore throat.   Respiratory:  Negative for cough and shortness of breath.   Cardiovascular:  Negative for chest pain and palpitations.  Gastrointestinal:  Negative for constipation, diarrhea and nausea.  Musculoskeletal:  Negative for myalgias.  Neurological:  Negative for dizziness and tremors.  Psychiatric/Behavioral:  Positive for depression and substance abuse. Negative for hallucinations and suicidal ideas. The patient is nervous/anxious.   Blood pressure 138/88, pulse 66, temperature 98 F (36.7 C), temperature source Oral, resp. rate 18, height '5\' 4"'   (1.626 m), weight 70.3 kg, SpO2 100 %. Body mass index is 26.61 kg/m.   Treatment Plan Summary: Daily contact with patient to assess and evaluate symptoms and progress in treatment and Medication management  Continue every 15-minute safety checks  Encouraged participation in group therapy and therapeutic milieu  Alcohol use disorder -Continue CIWA protocol with PRN lorazepam for CIWA scores greater than 10 and comfort medications. -Patient is interested in going to residential substance abuse treatment program on discharge.  Depression -Wellbutrin XL 300 mg daily  Anxiety -Continue hydroxyzine 25 mg Q6H PRN  Hypertension -Continue lisinopril 5 mg daily  Insomnia -Continue trazodone 100 mg QHS  GERD -Start Protonix 20 mg daily  Will order TFTs, lipid panel and hemoglobin A1c  Discharge planning in progress   Arthor Captain, MD 07/14/2020, 3:40 PM

## 2020-07-14 NOTE — Tx Team (Addendum)
Interdisciplinary Treatment and Diagnostic Plan Update  07/14/2020 Time of Session: 9:05 Joshua [name] MRN: [id number]  Principal Diagnosis: <principal problem not specified>  Secondary Diagnoses: Active Problems:   Major depressive disorder, recurrent severe without psychotic features (HCC)   Current Medications:  Current Facility-Administered Medications  Medication Dose Route Frequency Provider Last Rate Last Admin   buPROPion (WELLBUTRIN XL) 24 hr tablet 300 mg  300 mg Oral Daily Charm Rings, NP   300 mg at 07/14/20 0858   feeding supplement (ENSURE ENLIVE / ENSURE PLUS) liquid 237 mL  237 mL Oral BID BM Antonieta Pert, MD   237 mL at 07/13/20 1600   hydrOXYzine (ATARAX/VISTARIL) tablet 25 mg  25 mg Oral Q6H PRN Charm Rings, NP   25 mg at 07/12/20 2113   lisinopril (ZESTRIL) tablet 5 mg  5 mg Oral Daily Charm Rings, NP   5 mg at 07/14/20 9476   loperamide (IMODIUM) capsule 2-4 mg  2-4 mg Oral PRN Charm Rings, NP       LORazepam (ATIVAN) tablet 1 mg  1 mg Oral Q6H PRN Charm Rings, NP   1 mg at 07/13/20 2126   multivitamin with minerals tablet 1 tablet  1 tablet Oral Daily Charm Rings, NP   1 tablet at 07/14/20 0859   ondansetron (ZOFRAN-ODT) disintegrating tablet 4 mg  4 mg Oral Q6H PRN Charm Rings, NP   4 mg at 07/14/20 0900   pantoprazole (PROTONIX) EC tablet 20 mg  20 mg Oral Daily Claudie Revering, MD       thiamine tablet 100 mg  100 mg Oral Daily Charm Rings, NP   100 mg at 07/14/20 0859   traZODone (DESYREL) tablet 100 mg  100 mg Oral QHS Charm Rings, NP   100 mg at 07/13/20 2126   PTA Medications: Medications Prior to Admission  Medication Sig Dispense Refill Last Dose   buPROPion (WELLBUTRIN XL) 150 MG 24 hr tablet TAKE 1 TABLET (150 MG TOTAL) BY MOUTH DAILY. (Patient not taking: No sig reported) 7 tablet 0    buPROPion (WELLBUTRIN XL) 300 MG 24 hr tablet Take 1 tablet (300 mg total) by mouth in the morning. 30 tablet 1    hydrOXYzine  (ATARAX/VISTARIL) 50 MG tablet Take 1 tablet (50 mg total) by mouth 2 (two) times daily as needed. 60 tablet 1    ibuprofen (ADVIL) 200 MG tablet Take 200 mg by mouth every 6 (six) hours as needed for mild pain, moderate pain or headache.      lisinopril (ZESTRIL) 5 MG tablet TAKE 1 TABLET (5 MG TOTAL) BY MOUTH DAILY. 7 tablet 0    naltrexone (DEPADE) 50 MG tablet TAKE 1 TABLET (50 MG TOTAL) BY MOUTH DAILY. (Patient not taking: Reported on 07/11/2020) 7 tablet 0    traZODone (DESYREL) 100 MG tablet Take 1 tablet (100 mg total) by mouth at bedtime as needed. 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   Physician Treatment Plan for Primary Diagnosis: <principal problem not specified> Long Term Goal(s): Improvement in symptoms so as ready for discharge   Short Term Goals: Ability to identify changes in lifestyle to reduce recurrence of condition will improve Ability to verbalize feelings  will improve Ability to disclose and discuss suicidal ideas Ability to demonstrate self-control will improve Ability to identify and develop effective coping behaviors will improve Ability to maintain clinical measurements within normal limits will improve Ability to identify triggers associated with substance abuse/mental health issues will improve  Medication Management: Evaluate patient's response, side effects, and tolerance of medication regimen.  Therapeutic Interventions: 1 to 1 sessions, Unit Group sessions and Medication administration.  Evaluation of Outcomes: Progressing  Physician Treatment Plan for Secondary Diagnosis: Active Problems:   Major depressive disorder, recurrent  severe without psychotic features (HCC)  Long Term Goal(s): Improvement in symptoms so as ready for discharge   Short Term Goals: Ability to identify changes in lifestyle to reduce recurrence of condition will improve Ability to verbalize feelings will improve Ability to disclose and discuss suicidal ideas Ability to demonstrate self-control will improve Ability to identify and develop effective coping behaviors will improve Ability to maintain clinical measurements within normal limits will improve Ability to identify triggers associated with substance abuse/mental health issues will improve     Medication Management: Evaluate patient's response, side effects, and tolerance of medication regimen.  Therapeutic Interventions: 1 to 1 sessions, Unit Group sessions and Medication administration.  Evaluation of Outcomes: Progressing   RN Treatment Plan for Primary Diagnosis: <principal problem not specified> Long Term Goal(s): Knowledge of disease and therapeutic regimen to maintain health will improve  Short Term Goals: Ability to verbalize frustration and anger appropriately will improve, Ability to demonstrate self-control, Ability to participate in decision making will improve, Ability to verbalize feelings will improve, Ability to disclose and discuss suicidal ideas, and Ability to identify and develop effective coping behaviors will improve  Medication Management: RN will administer medications as ordered by provider, will assess and evaluate patient's response and provide education to patient for prescribed medication. RN will report any adverse and/or side effects to prescribing provider.  Therapeutic Interventions: 1 on 1 counseling sessions, Psychoeducation, Medication administration, Evaluate responses to treatment, Monitor vital signs and CBGs as ordered, Perform/monitor CIWA, COWS, AIMS and Fall Risk screenings as ordered, Perform wound care treatments as ordered.  Evaluation of  Outcomes: Progressing   LCSW Treatment Plan for Primary Diagnosis: <principal problem not specified> Long Term Goal(s): Safe transition to appropriate next level of care at discharge, Engage patient in therapeutic group addressing interpersonal concerns.  Short Term Goals: Engage patient in aftercare planning with referrals and resources, Increase emotional regulation, Facilitate patient progression through stages of change regarding substance use diagnoses and concerns, Identify triggers associated with mental health/substance abuse issues, and Increase skills for wellness and recovery  Therapeutic Interventions: Assess for all discharge needs, 1 to 1 time with Social worker, Explore available resources and support systems, Assess for adequacy in community support network, Educate family and significant other(s) on suicide prevention, Complete Psychosocial Assessment, Interpersonal group therapy.  Evaluation of Outcomes: Progressing   Progress in Treatment: Attending groups: Yes. Participating in groups: Yes. Taking medication as prescribed: Yes. Toleration medication: Yes. Family/Significant other contact made: No, will contact Casimiro Needle or Remi Haggard friends  Patient understands diagnosis: Yes. Discussing patient identified problems/goals with staff: Yes. Medical problems stabilized or resolved: Yes. Denies suicidal/homicidal ideation: Yes. Issues/concerns per patient self-inventory: No. Other: N/A  New problem(s) identified: No, Describe:  n/a  New Short Term/Long Term Goal(s): detox, medication management for mood stabilization; elimination of SI thoughts; development of comprehensive mental wellness/sobriety plan     Patient Goals:  Patient states he is working on getting treatment with  motivations to get custody with his kids.   Discharge Plan or Barriers: Patient recently admitted. CSW will continue to follow and assess for appropriate referrals and possible discharge  planning.    Reason for Continuation of Hospitalization: Anxiety Depression Medication stabilization Withdrawal symptoms  Estimated Length of Stay: 3-5 days  Attendees: Patient: Joshua [name] 07/14/2020, 9:05am  Physician: Clifton Custard, MD 07/14/2020, 9:05 am  Nursing:    RN Care Manager:   Social Worker: Anson [place], LCSW 07/14/2020, 9:05 am  Recreational Therapist:    Other:    Other:    Other:     Scribe for Treatment Team: Beatris Si, LCSW 07/14/2020 11:46 AM

## 2020-07-15 DIAGNOSIS — F332 Major depressive disorder, recurrent severe without psychotic features: Principal | ICD-10-CM

## 2020-07-15 MED ORDER — SERTRALINE HCL 50 MG PO TABS
50.0000 mg | ORAL_TABLET | Freq: Every day | ORAL | Status: DC
  Administered 2020-07-15 – 2020-07-17 (×3): 50 mg via ORAL
  Filled 2020-07-15 (×2): qty 1
  Filled 2020-07-15: qty 7
  Filled 2020-07-15: qty 1
  Filled 2020-07-15: qty 7
  Filled 2020-07-15 (×2): qty 1

## 2020-07-15 MED ORDER — PANTOPRAZOLE SODIUM 40 MG PO TBEC
40.0000 mg | DELAYED_RELEASE_TABLET | Freq: Every day | ORAL | Status: DC
  Administered 2020-07-16 – 2020-07-17 (×2): 40 mg via ORAL
  Filled 2020-07-15: qty 7
  Filled 2020-07-15 (×3): qty 1
  Filled 2020-07-15: qty 7

## 2020-07-15 MED ORDER — DIVALPROEX SODIUM 500 MG PO DR TAB
500.0000 mg | DELAYED_RELEASE_TABLET | Freq: Every day | ORAL | Status: DC
  Administered 2020-07-15 – 2020-07-16 (×2): 500 mg via ORAL
  Filled 2020-07-15: qty 7
  Filled 2020-07-15 (×2): qty 1
  Filled 2020-07-15: qty 7
  Filled 2020-07-15: qty 1

## 2020-07-15 MED ORDER — HYDROXYZINE HCL 25 MG PO TABS
25.0000 mg | ORAL_TABLET | Freq: Three times a day (TID) | ORAL | Status: DC | PRN
  Administered 2020-07-16: 25 mg via ORAL
  Filled 2020-07-15: qty 1
  Filled 2020-07-15: qty 10

## 2020-07-15 MED ORDER — HYDROXYZINE HCL 50 MG PO TABS
50.0000 mg | ORAL_TABLET | Freq: Every evening | ORAL | Status: DC | PRN
  Administered 2020-07-15 – 2020-07-16 (×2): 50 mg via ORAL
  Filled 2020-07-15: qty 7
  Filled 2020-07-15 (×2): qty 1

## 2020-07-15 MED ORDER — PANTOPRAZOLE SODIUM 20 MG PO TBEC
20.0000 mg | DELAYED_RELEASE_TABLET | Freq: Once | ORAL | Status: AC
  Administered 2020-07-15: 20 mg via ORAL
  Filled 2020-07-15 (×2): qty 1

## 2020-07-15 NOTE — Progress Notes (Signed)
  Pt c/o of nausea. Pt also says "I feel like the acid in my stomach is making me throw up."  Order received for Zofran and additional dose of Protonix.  Pt has been calm and no nausea or vomitting reported after pt received Zofran and second dose of Protonix.    07/15/20 1300  Psych Admission Type (Psych Patients Only)  Admission Status Voluntary  Psychosocial Assessment  Patient Complaints Anxiety;Sadness;Tension  Eye Contact Fair  Facial Expression Anxious  Affect Anxious  Speech Logical/coherent  Interaction Assertive  Motor Activity Fidgety  Appearance/Hygiene Unremarkable  Behavior Characteristics Cooperative;Anxious  Mood Anxious  Thought Process  Coherency WDL  Content WDL  Delusions None reported or observed  Perception WDL  Hallucination None reported or observed  Judgment Impaired  Confusion None  Danger to Self  Current suicidal ideation? Denies  Danger to Others  Danger to Others None reported or observed  Danger to Others Abnormal  Harmful Behavior to others No threats or harm toward other people  Destructive Behavior No threats or harm toward property

## 2020-07-15 NOTE — Progress Notes (Signed)
Recreation Therapy Notes  Animal-Assisted Activity (AAA) Program Checklist/Progress Notes Patient Eligibility Criteria Checklist & Daily Group note for Rec Tx Intervention  Date: 7.5.22 Time: 1430 Location: [address]  AAA/T Program Assumption of Risk Form signed by Engineer, production or Parent Legal Guardian  YES  Patient is free of allergies or severe asthma  YES  Patient reports no fear of animals  YES   Patient reports no history of cruelty to animals YES  Patient understands his/her participation is voluntary  YES   Patient washes hands before animal contact YES   Patient washes hands after animal contact YES   Behavioral Response: Engaged  Education: Charity fundraiser, Appropriate Animal Interaction   Education Outcome: Acknowledges understanding/In group clarification offered/Needs additional education.   Clinical Observations/Feedback: Pt attended and interacted with B[place].  Pt expressed no concerns.    Caroll Rancher, LRT/CTRS        Caroll Rancher A 07/15/2020 3:42 PM

## 2020-07-15 NOTE — [facility] Suicide Risk Assessment (Signed)
[facility] INPATIENT:  Family/Significant Other Suicide Prevention Education  Suicide Prevention Education:  Education Completed; Finnick Orosz [phone] (Friend) has been identified by the patient as the family member/significant other with whom the patient will be residing, and identified as the person(s) who will aid the patient in the event of a mental health crisis (suicidal ideations/suicide attempt).  With written consent from the patient, the family member/significant other has been provided the following suicide prevention education, prior to the and/or following the discharge of the patient.  The suicide prevention education provided includes the following: Suicide risk factors Suicide prevention and interventions National Suicide Hotline telephone number [facility] assessment telephone number [facility] Emergency Assistance 911 [facility] and/or Residential Mobile Crisis Unit telephone number  Request made of family/significant other to: Remove weapons (e.g., guns, rifles, knives), all items previously/currently identified as safety concern.   Remove drugs/medications (over-the-counter, prescriptions, illicit drugs), all items previously/currently identified as a safety concern.  The family member/significant other verbalizes understanding of the suicide prevention education information provided.  The family member/significant other agrees to remove the items of safety concern listed above.  CSW spoke with Mr. Garret Reddish who states that Mr Murri has been depressed and experiencing a lot of stress due to his up-coming court date with child protective services over the custody of his minor child.  Mr. Garret Reddish states that Mr. Pryce and him have been friends for several years and speak on a daily basis.  Mr. Garret Reddish states that Mr. Morelos lives with him and can come back to the home after discharge.  Mr. Garret Reddish states that Mr. Cornia has been talking about going to a  treatment center for substance use and he states that he will support him in this but does not believe that he should have to go there to stop drinking.  Mr. Garret Reddish states that there are firearms in the home but states that they are locked in a safe and he is the only one who knows the code.  CSW completed SPE with Mr. Garret Reddish.   Metro Kung Azariel Banik 07/15/2020, 3:32 PM

## 2020-07-15 NOTE — Progress Notes (Signed)
Adult Psychoeducational Group Note  Date:  07/15/2020 Time:  1:49 PM  Group Topic/Focus:  Orientation:   The focus of this group is to educate the patient on the purpose and policies of crisis stabilization and provide a format to answer questions about their admission.  The group details unit policies and expectations of patients while admitted.  Participation Level:  Active  Participation Quality:  Appropriate  Affect:  Appropriate  Cognitive:  Appropriate  Insight: Appropriate  Engagement in Group:  Engaged  Modes of Intervention:  Discussion  Additional Comments:  Patient attended morning orientation group and said he needed a switch in medication. He also said that he needed help with his Rehab plans.   Birdella Sippel W Adil Tugwell 07/15/2020, 1:49 PM

## 2020-07-15 NOTE — [facility] Counselor (Signed)
CSW spoke with Adolm Joseph [phone] at [facility] Department of Child Protective Services (CPS) who states that she has had limited communication with Mr. Bakos for the past year and just recently began communications with him in June of 2022.  Mrs. 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  Mrs. Manson Passey confirms the July, 13th, 2022 court date and states that Mr. Niess will need substance use treatment, therapy, medication management, and anger management.  Mrs. Manson Passey states that she would like a phone call before Mr. Dugger discharges from the hospital so she will know to follow up with him at a treatment center or at home.  CSW will let Mrs. Manson Passey know when there is a scheduled discharge date for Mr. Housey.

## 2020-07-15 NOTE — Progress Notes (Signed)
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  Objective: Medical record reviewed.  Patient's case discussed in detail with members of the treatment team.  I met with and evaluated the patient on the unit today for follow-up.  Patient presents as irritable with hyperverbal speech.  He attempts to cooperate with our conversation but at times interrupts or talks over me when we are interacting.  Thought processes are coherent but tangential and patient requires redirection to return to topic.  There is no tremor on exam.  Patient reports cravings but denies any withdrawal symptoms from alcohol use.  He did have heartburn today which caused him to cough and then he had an episode of vomiting.  Patient states that this has happened off and on over a period of months.  He denies nausea.  Patient states that his medications of Wellbutrin and trazodone are "not working" but has difficulty explaining this statement.  Eventually I am able to elicit that he would like better control of irritability, greater frustration tolerance, decreased anxiety and improved sleep.  He has been taking Wellbutrin XL since prior to admission not feel that it has been helpful for anxiety or mood.  We discussed initiation of Depakote for mood stabilization and patient is receptive to starting Depakote.   I also discussed initiation of Zoloft to treat depression and anxiety and lieu of Wellbutrin.  Patient is receptive to starting Zoloft.  Risks, benefits, side effects, target symptoms, anticipated treatment outcomes of medications were discussed with patient.  We will try higher dose of hydroxyzine 50 mg at bedtime for  sleep tonight and reassess tomorrow.  I also discussed with patient that I will increase his dose of Protonix to better control his GERD.  Patient is in agreement with these medication changes.  He denies SI, AH, VH or PI.  He intermittently has thoughts of harming his ex but denies any intent or plan to act on these thoughts and states that they are chronic.  Patient continues to experience middle insomnia.  He is eating adequately.  Patient slept 6.5 hours last night.  Evening labs from 07/14/2020 include lipid panel with triglycerides of 591 and otherwise WNL.  TSH and free T4 WNL.  Hemoglobin A1c was drawn but results are still pending.  Principal Problem: Major depressive disorder, recurrent severe without psychotic features ([place]) Diagnosis: Principal Problem:   Major depressive disorder, recurrent severe without psychotic features ([place]) Active Problems:   Alcohol use disorder, severe, dependence ([place])  Total Time spent with patient:  25 minutes  Past Psychiatric History: See admission H&P.  Patient has prior psychiatric diagnoses of depression, substance dependence and attention deficit hyperactivity disorder.  Patient has been admitted to inpatient psychiatric hospitals to previous times.  His most recent admission was at [facility] in March 2022.  Patient states that he has never been treated in residential substance abuse treatment program in the past.  Patient reports a history of several past suicide attempts including an attempt to hang himself in March 2022 and several other attempts during his lifetime where he attempted to walk into traffic.  He reports a remote history of nonsuicidal self-injurious behavior but has not engaged in  this and in years.  Past Medical History:  Past Medical History:  Diagnosis Date   ADD (attention deficit disorder)    Depression    Paranoid ([place])     Past Surgical History:  Procedure Laterality Date   HERNIA REPAIR     Family  History: History reviewed. No pertinent family history. Family Psychiatric  History: See admission H&P.  Patient reports there is a family member with Alzheimer's disease. Social History:  Social History   Substance and Sexual Activity  Alcohol Use Yes   Comment: occassional 24oz beer at night     Social History   Substance and Sexual Activity  Drug Use No    Social History   Socioeconomic History   Marital status: Single    Spouse name: Not on file   Number of children: Not on file   Years of education: Not on file   Highest education level: Not on file  Occupational History   Not on file  Tobacco Use   Smoking status: Every Day    Packs/day: 0.50    Pack years: 0.00    Types: Cigarettes   Smokeless tobacco: Never  Substance and Sexual Activity   Alcohol use: Yes    Comment: occassional 24oz beer at night   Drug use: No   Sexual activity: Not on file  Other Topics Concern   Not on file  Social History Narrative   Not on file   Social Determinants of Health   Financial Resource Strain: Not on file  Food Insecurity: Not on file  Transportation Needs: Not on file  Physical Activity: Not on file  Stress: Not on file  Social Connections: Not on file   Additional Social History:                         Sleep: Good  Appetite:  Fair  Current Medications: Current Facility-Administered Medications  Medication Dose Route Frequency Provider Last Rate Last Admin   divalproex (DEPAKOTE) DR tablet 500 mg  500 mg Oral QHS Arthor Captain, MD       feeding supplement (ENSURE ENLIVE / ENSURE PLUS) liquid 237 mL  237 mL Oral BID BM Sharma Covert, MD   237 mL at 07/15/20 1056   hydrOXYzine (ATARAX/VISTARIL) tablet 25 mg  25 mg Oral TID PRN Arthor Captain, MD       hydrOXYzine (ATARAX/VISTARIL) tablet 50 mg  50 mg Oral QHS PRN Arthor Captain, MD       lisinopril (ZESTRIL) tablet 5 mg  5 mg Oral Daily Patrecia Pour, NP   5 mg at 07/15/20 1638   loperamide  (IMODIUM) capsule 2-4 mg  2-4 mg Oral PRN Patrecia Pour, NP       LORazepam (ATIVAN) tablet 1 mg  1 mg Oral Q6H PRN Patrecia Pour, NP   1 mg at 07/14/20 2113   multivitamin with minerals tablet 1 tablet  1 tablet Oral Daily Patrecia Pour, NP   1 tablet at 07/15/20 0953   ondansetron (ZOFRAN-ODT) disintegrating tablet 4 mg  4 mg Oral Q6H PRN Patrecia Pour, NP   4 mg at 07/15/20 1303   [START ON 07/16/2020] pantoprazole (PROTONIX) EC tablet 40 mg  40 mg Oral Daily Arthor Captain, MD       sertraline (ZOLOFT) tablet 50 mg  50 mg Oral Daily Arthor Captain, MD   50 mg at 07/15/20 1357  thiamine tablet 100 mg  100 mg Oral Daily Patrecia Pour, NP   100 mg at 07/15/20 4132    Lab Results:  Results for orders placed or performed during the hospital encounter of 07/12/20 (from the past 48 hour(s))  Lipid panel     Status: Abnormal   Collection Time: 07/14/20  6:23 PM  Result Value Ref Range   Cholesterol 188 0 - 200 mg/dL   Triglycerides 591 (H) <150 mg/dL   HDL 45 >40 mg/dL   Total CHOL/HDL Ratio 4.2 RATIO   VLDL UNABLE TO CALCULATE IF TRIGLYCERIDE OVER 400 mg/dL 0 - 40 mg/dL   LDL Cholesterol UNABLE TO CALCULATE IF TRIGLYCERIDE OVER 400 mg/dL 0 - 99 mg/dL    Comment:        Total Cholesterol/HDL:CHD Risk Coronary Heart Disease Risk Table                     Men   Women  1/2 Average Risk   3.4   3.3  Average Risk       5.0   4.4  2 X Average Risk   9.6   7.1  3 X Average Risk  23.4   11.0        Use the calculated Patient Ratio above and the CHD Risk Table to determine the patient's CHD Risk.        ATP III CLASSIFICATION (LDL):  <100     mg/dL   Optimal  100-129  mg/dL   Near or Above                    Optimal  130-159  mg/dL   Borderline  160-189  mg/dL   High  >190     mg/dL   Very High Performed at [facility] [address], [place], [place] [address]   TSH     Status: None   Collection Time: 07/14/20  6:23 PM  Result Value Ref Range   TSH  3.632 0.350 - 4.500 uIU/mL    Comment: Performed by a 3rd Generation assay with a functional sensitivity of <=0.01 uIU/mL. Performed at [facility], [place] [address], [place], [place] [address]   T4, free     Status: None   Collection Time: 07/14/20  6:23 PM  Result Value Ref Range   Free T4 0.74 0.61 - 1.12 ng/dL    Comment: (NOTE) Biotin ingestion may interfere with free T4 tests. If the results are inconsistent with the TSH level, previous test results, or the clinical presentation, then consider biotin interference. If needed, order repeat testing after stopping biotin. Performed at [facility], [place] [address], [place], [place] [address]   LDL cholesterol, direct     Status: None   Collection Time: 07/14/20  6:23 PM  Result Value Ref Range   Direct LDL 90.6 0 - 99 mg/dL    Comment: Performed at [facility] [address], [place], [place] [address]    Blood Alcohol level:  Lab Results  Component Value Date   ETH 251 (H) 07/10/2020   ETH 143 (H) 59/56/3875    Metabolic Disorder Labs: No results found for: HGBA1C, MPG No results found for: PROLACTIN Lab Results  Component Value Date   CHOL 188 07/14/2020   TRIG 591 (H) 07/14/2020   HDL 45 07/14/2020   CHOLHDL 4.2 07/14/2020   VLDL UNABLE TO CALCULATE IF TRIGLYCERIDE OVER 400 mg/dL  07/14/2020   LDLCALC UNABLE TO CALCULATE IF TRIGLYCERIDE OVER 400 mg/dL 07/14/2020    Physical Findings: AIMS:  , ,  ,  ,    CIWA:  CIWA-Ar Total: 2 COWS:     Musculoskeletal: Strength & Muscle Tone: within normal limits Gait & Station: normal Patient leans: N/A  Psychiatric Specialty Exam:  Presentation  General Appearance: Fairly Groomed  Eye Contact:Good  Speech:Clear and Coherent; Other (comment) (Hyperverbal.  Interrupts others.)  Speech Volume:Normal  Handedness:Right   Mood and Affect  Mood:Irritable; Anxious; Dysphoric  Affect:Congruent   Thought Process  Thought  Processes:Coherent  Descriptions of Associations:Tangential  Orientation:Full (Time, Place and Person)  Thought Content:Rumination; Tangential  History of Schizophrenia/Schizoaffective disorder:No  Duration of Psychotic Symptoms:No data recorded Hallucinations:Hallucinations: None  Ideas of Reference:None  Suicidal Thoughts:Suicidal Thoughts: No  Homicidal Thoughts:Homicidal Thoughts: No   Sensorium  Memory:Immediate Fair; Recent Fair; Remote Fair  Judgment:Fair  Insight:Shallow   Executive Functions  Concentration:Fair  Attention Span:Fair  [place]   Psychomotor Activity  Psychomotor Activity:Psychomotor Activity: Normal   Assets  Assets:Communication Skills; Desire for Improvement; Resilience   Sleep  Sleep:Sleep: Fair Number of Hours of Sleep: 6.5    Physical Exam: Physical Exam Vitals and nursing note reviewed.  Constitutional:      General: He is not in acute distress.    Appearance: Normal appearance. He is not diaphoretic.  HENT:     Head: Normocephalic and atraumatic.  Pulmonary:     Effort: Pulmonary effort is normal.  Neurological:     General: No focal deficit present.     Mental Status: He is alert and oriented to person, place, and time.   Review of Systems  Constitutional:  Negative for chills, diaphoresis and fever.  HENT:  Negative for sore throat.   Respiratory:  Negative for cough and shortness of breath.   Cardiovascular:  Negative for chest pain and palpitations.  Gastrointestinal:  Positive for heartburn and vomiting. Negative for constipation, diarrhea and nausea.  Musculoskeletal:  Negative for myalgias.  Neurological:  Negative for dizziness and tremors.  Psychiatric/Behavioral:  Positive for depression and substance abuse. Negative for hallucinations and suicidal ideas. The patient is nervous/anxious and has insomnia.   Blood pressure (!) 153/80, pulse 71, temperature 98.3  F (36.8 C), temperature source Oral, resp. rate 16, height 5' 4" (1.626 m), weight 70.3 kg, SpO2 99 %. Body mass index is 26.61 kg/m.   Treatment Plan Summary: Daily contact with patient to assess and evaluate symptoms and progress in treatment and Medication management  Continue every 15-minute safety checks  Encouraged participation in group therapy and therapeutic milieu  Alcohol use disorder -Continue CIWA protocol with PRN lorazepam for CIWA scores greater than 10 and comfort medications. -Patient is interested in going to residential substance abuse treatment program on discharge.  Depression -Discontinue Wellbutrin XL 300 mg daily due to lack of efficacy -Start Zoloft 50 mg daily for depression and anxiety -Start Depakote DR 500 mg at bedtime for mood stabilization. Consider further upward titration.  Anxiety -Continue hydroxyzine 25 mg TID PRN  Hypertension -Continue lisinopril 5 mg daily  Insomnia -Discontinue trazodone 100 mg QHS due to lack of efficacy -Start hydroxyzine 50 mg QHS PRN insomnia  GERD -Increase Protonix  to 40 mg daily  Discharge planning in progress   Arthor Captain, MD 07/15/2020, 5:53 PM

## 2020-07-15 NOTE — Progress Notes (Signed)
   07/15/20 2154  Psych Admission Type (Psych Patients Only)  Admission Status Voluntary  Psychosocial Assessment  Patient Complaints Anxiety  Eye Contact Fair  Facial Expression Anxious  Affect Anxious  Speech Logical/coherent  Interaction Assertive  Motor Activity Fidgety  Appearance/Hygiene Unremarkable  Behavior Characteristics Cooperative  Mood Anxious  Thought Process  Coherency WDL  Content WDL  Delusions None reported or observed  Perception WDL  Hallucination None reported or observed  Judgment Impaired  Confusion None  Danger to Self  Current suicidal ideation? Denies  Danger to Others  Danger to Others None reported or observed  Danger to Others Abnormal  Harmful Behavior to others No threats or harm toward other people  Destructive Behavior No threats or harm toward property  D: Patient c/o about nausea but no vomiting. Report his day was okay but looking forward to discharge soon.  A: Medications administered as prescribed. Support and encouragement provided as needed.  R: Patient remains safe on the unit. Will continue to monitor for safety and stability.

## 2020-07-15 NOTE — Progress Notes (Signed)
NUTRITION ASSESSMENT  Pt identified as at risk on the Malnutrition Screen Tool  INTERVENTION: 1. Supplements: Ensure Enlive po BID, each supplement provides 350 kcal and 20 grams of protein   NUTRITION DIAGNOSIS: Unintentional weight loss related to sub-optimal intake as evidenced by pt report.   Goal: Pt to meet >/= 90% of their estimated nutrition needs.  Monitor:  PO intake  Assessment:  Pt admitted for MDD and alcohol abuse. History of cocaine use as well.  Per weight records, pt has lost 12 lbs since 3/22 (7% wt loss x 3 months, significant for time frame).  Ensure supplements have been ordered.   Height: Ht Readings from Last 1 Encounters:  07/12/20 5\' 4"  (1.626 m)    Weight: Wt Readings from Last 1 Encounters:  07/12/20 70.3 kg    Weight Hx: Wt Readings from Last 10 Encounters:  07/12/20 70.3 kg  07/10/20 72.6 kg  04/02/20 73 kg  04/01/20 76.2 kg  12/28/19 74.1 kg  10/21/15 72.6 kg    BMI:  Body mass index is 26.61 kg/m. Pt meets criteria for overweight  based on current BMI.  Estimated Nutritional Needs: Kcal: 25-30 kcal/kg Protein: > 1 gram protein/kg Fluid: 1 ml/kcal  Diet Order:  Diet Order             Diet Heart Room service appropriate? Yes; Fluid consistency: Thin  Diet effective now                  Pt is also offered choice of unit snacks mid-morning and mid-afternoon.  Pt is eating as desired.   Lab results and medications reviewed.   12/21/15, MS, RD, LDN Inpatient Clinical Dietitian Contact information available via Amion

## 2020-07-15 NOTE — Progress Notes (Signed)
The focus of this group is to help patients review their daily goal of treatment and discuss progress on daily workbooks. Pt did not attend the evening group. 

## 2020-07-16 LAB — HEMOGLOBIN A1C
Hgb A1c MFr Bld: 5.4 % (ref 4.8–5.6)
Mean Plasma Glucose: 108 mg/dL

## 2020-07-16 MED ORDER — ONDANSETRON HCL 4 MG PO TABS
4.0000 mg | ORAL_TABLET | Freq: Three times a day (TID) | ORAL | Status: DC | PRN
  Administered 2020-07-16: 4 mg via ORAL
  Filled 2020-07-16: qty 1

## 2020-07-16 MED ORDER — DIVALPROEX SODIUM 250 MG PO DR TAB
250.0000 mg | DELAYED_RELEASE_TABLET | Freq: Every day | ORAL | Status: DC
  Administered 2020-07-16 – 2020-07-17 (×2): 250 mg via ORAL
  Filled 2020-07-16: qty 1
  Filled 2020-07-16: qty 7
  Filled 2020-07-16: qty 1
  Filled 2020-07-16: qty 7
  Filled 2020-07-16: qty 1

## 2020-07-16 NOTE — BHH Group Notes (Signed)
Type of Therapy and Topic:  Group Therapy:  Setting Goals   Participation Level:  Active   Description of Group: In this process group, patients discussed using strengths to work toward goals and address challenges.  Patients identified two positive things about themselves and one goal they were working on.  Patients were given the opportunity to share openly and support each other's plan for self-empowerment.  The group discussed the value of gratitude and were encouraged to have a daily reflection of positive characteristics or circumstances.  Patients were encouraged to identify a plan to utilize their strengths to work on current challenges and goals.   Therapeutic Goals Patient will verbalize personal strengths/positive qualities and relate how these can assist with achieving desired personal goals Patients will verbalize affirmation of peers plans for personal change and goal setting Patients will explore the value of gratitude and positive focus as related to successful achievement of goals Patients will verbalize a plan for regular reinforcement of personal positive qualities and circumstances.   Summary of Patient Progress: Pt was appropriate during group.   Fredirick Lathe, LCSWA Clinicial Social Worker [employer]

## 2020-07-16 NOTE — BHH Counselor (Signed)
CSW spoke with [facility] who stated that pt has been accepted but there will no open beds until 7/11 and that spt must have a court letter faxed to their facility from his lawyer.   CSW spoke to [facility] who stated that have received pt's referral but that the psychiatrist has not reviewed it at this time.   Fredirick Lathe, LCSWA Clinicial Social Worker [employer]

## 2020-07-16 NOTE — Progress Notes (Signed)
Recreation Therapy Notes  Date:  7.6.22 Time: 0930 Location: [address] Dayroom  Group Topic: Stress Management  Goal Area(s) Addresses:  Patient will identify positive stress management techniques. Patient will identify benefits of using stress management post d/c.  Intervention: Stress Management  Activity:  Meditation.  LRT played a meditation that focused on forgiving others to release yourself of the burden of carrying any anger, frustration or hurt caused by the person who wronged them.    Education:  Stress Management, Discharge Planning.   Education Outcome: Acknowledges Education  Clinical Observations/Feedback: Pt did not attend stress management session.    Sadiq Mccauley, LRT/CTRS         Scharlene Catalina A 07/16/2020 11:43 AM 

## 2020-07-16 NOTE — BHH Counselor (Signed)
CSW spoke with the patient who states that he does not want to go to [facility].  He states that he wants to pursue a bed at [facility] and will call each day after discharge to see if a bed is available.  He states that he has already spoken with his lawyer and states that she is working on the letter and will send it to [facility] this week.  The patient states that he would like to be able to go get his clothes before going to the treatment center.

## 2020-07-16 NOTE — Plan of Care (Signed)
Nurse discussed anxiety, depression, coping skills, outpatient treatment with patient.

## 2020-07-16 NOTE — Progress Notes (Signed)
   07/16/20 2119  Psych Admission Type (Psych Patients Only)  Admission Status Voluntary  Psychosocial Assessment  Patient Complaints Anxiety;Depression  Eye Contact Fair  Facial Expression Anxious  Affect Anxious  Speech Logical/coherent  Interaction Assertive  Motor Activity Fidgety  Appearance/Hygiene Unremarkable  Behavior Characteristics Cooperative;Appropriate to situation  Mood Depressed;Anxious  Thought Process  Coherency WDL  Content WDL  Delusions None reported or observed  Perception WDL  Hallucination None reported or observed  Judgment Impaired  Confusion None  Danger to Self  Current suicidal ideation? Denies  Danger to Others  Danger to Others Reported or observed  Danger to Others Abnormal  Harmful Behavior to others No threats or harm toward other people  Destructive Behavior No threats or harm toward property  Description of Harmful Behavior no threats made at time of assessment

## 2020-07-16 NOTE — Progress Notes (Signed)
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  Objective: Medical record reviewed.  Patient's case discussed in detail with members of the treatment team.  I met with and evaluated the patient on the unit today for follow-up.  He is cooperative and polite during our interaction.  Patient presents as slightly improved today with less irritability, more stable affect and less hyperverbal speech.  Eye contact is good.  There is no tremor or other motor abnormality observed.  Patient describes his mood as "fair." He denies passive wish for death or suicidal ideation.  Patient states that he experiences chronic intermittent thoughts of harming his ex and the mother of his ex due to child custody dispute, but he adamantly denies any plan or intent to act on these thoughts.  He continues to experience periodic irritation/anger about a variety of topics and has some difficulty letting go of anger or irritation and shifting thoughts elsewhere.  He slept better last night and appetite is good.  Patient denies PI, AH or VH.  Patient is tolerating medication changes made yesterday without difficulty, including discontinuation of Wellbutrin and initiation of Zoloft and Depakote.  He has a mild headache today but denies other medication side effects.  Patient reports that he has had a nonproductive cough periodically at least since March and feels he was started on lisinopril around that time.  His reflux has improved with increase in Protonix.  Patient continues to experience intermittent nausea with reflux or when he coughs.  He has not had any additional emesis.  He denies any withdrawal symptoms other  than cravings.  He remains interested in attending residential substance abuse treatment program after discharge and social work is trying to get him accepted into [facility].  The patient slept 6.5 hours last night.  No new labs today.  Principal Problem: Major depressive disorder, recurrent severe without psychotic features ([place]) Diagnosis: Principal Problem:   Major depressive disorder, recurrent severe without psychotic features ([place]) Active Problems:   Alcohol use disorder, severe, dependence ([place])  Total Time spent with patient:  20 minutes  Past Psychiatric History: See admission H&P.  Patient has prior psychiatric diagnoses of depression, substance dependence and attention deficit hyperactivity disorder.  Patient has been admitted to inpatient psychiatric hospitals to previous times.  His most recent admission was at [facility] in March 2022.  Patient states that he has never been treated in residential substance abuse treatment program in the past.  Patient reports a history of several past suicide attempts including an attempt to hang himself in March 2022 and several other attempts during his lifetime where he attempted to walk into traffic.  He reports a remote history of nonsuicidal self-injurious behavior but has not engaged in this and in years.  Past Medical History:  Past Medical History:  Diagnosis Date   ADD (attention deficit disorder)    Depression    Paranoid ([place])     Past Surgical History:  Procedure Laterality Date   HERNIA REPAIR     Family History: History reviewed. No pertinent family history. Family Psychiatric  History: See admission H&P.  Patient reports there is a family member with Alzheimer's disease. Social History:  Social  History   Substance and Sexual Activity  Alcohol Use Yes   Comment: occassional 24oz beer at night     Social History   Substance and Sexual Activity  Drug Use No    Social History   Socioeconomic History    Marital status: Single    Spouse name: Not on file   Number of children: Not on file   Years of education: Not on file   Highest education level: Not on file  Occupational History   Not on file  Tobacco Use   Smoking status: Every Day    Packs/day: 0.50    Pack years: 0.00    Types: Cigarettes   Smokeless tobacco: Never  Substance and Sexual Activity   Alcohol use: Yes    Comment: occassional 24oz beer at night   Drug use: No   Sexual activity: Not on file  Other Topics Concern   Not on file  Social History Narrative   Not on file   Social Determinants of Health   Financial Resource Strain: Not on file  Food Insecurity: Not on file  Transportation Needs: Not on file  Physical Activity: Not on file  Stress: Not on file  Social Connections: Not on file   Additional Social History:                         Sleep: Good  Appetite:  Fair  Current Medications: Current Facility-Administered Medications  Medication Dose Route Frequency Provider Last Rate Last Admin   divalproex (DEPAKOTE) DR tablet 250 mg  250 mg Oral Daily Arthor Captain, MD   250 mg at 07/16/20 0900   divalproex (DEPAKOTE) DR tablet 500 mg  500 mg Oral QHS Arthor Captain, MD   500 mg at 07/15/20 2123   feeding supplement (ENSURE ENLIVE / ENSURE PLUS) liquid 237 mL  237 mL Oral BID BM Sharma Covert, MD   237 mL at 07/16/20 1011   hydrOXYzine (ATARAX/VISTARIL) tablet 25 mg  25 mg Oral TID PRN Arthor Captain, MD   25 mg at 07/16/20 0733   hydrOXYzine (ATARAX/VISTARIL) tablet 50 mg  50 mg Oral QHS PRN Arthor Captain, MD   50 mg at 07/15/20 2121   lisinopril (ZESTRIL) tablet 5 mg  5 mg Oral Daily Patrecia Pour, NP   5 mg at 07/16/20 2707   multivitamin with minerals tablet 1 tablet  1 tablet Oral Daily Patrecia Pour, NP   1 tablet at 07/16/20 0730   ondansetron (ZOFRAN) tablet 4 mg  4 mg Oral Q8H PRN Arthor Captain, MD   4 mg at 07/16/20 0733   pantoprazole (PROTONIX) EC tablet 40 mg  40  mg Oral Daily Arthor Captain, MD   40 mg at 07/16/20 0729   sertraline (ZOLOFT) tablet 50 mg  50 mg Oral Daily Arthor Captain, MD   50 mg at 07/16/20 8675   thiamine tablet 100 mg  100 mg Oral Daily Patrecia Pour, NP   100 mg at 07/16/20 0730    Lab Results:  Results for orders placed or performed during the hospital encounter of 07/12/20 (from the past 48 hour(s))  Lipid panel     Status: Abnormal   Collection Time: 07/14/20  6:23 PM  Result Value Ref Range   Cholesterol 188 0 - 200 mg/dL   Triglycerides 591 (H) <150 mg/dL   HDL 45 >40 mg/dL   Total CHOL/HDL Ratio  4.2 RATIO   VLDL UNABLE TO CALCULATE IF TRIGLYCERIDE OVER 400 mg/dL 0 - 40 mg/dL   LDL Cholesterol UNABLE TO CALCULATE IF TRIGLYCERIDE OVER 400 mg/dL 0 - 99 mg/dL    Comment:        Total Cholesterol/HDL:CHD Risk Coronary Heart Disease Risk Table                     Men   Women  1/2 Average Risk   3.4   3.3  Average Risk       5.0   4.4  2 X Average Risk   9.6   7.1  3 X Average Risk  23.4   11.0        Use the calculated Patient Ratio above and the CHD Risk Table to determine the patient's CHD Risk.        ATP III CLASSIFICATION (LDL):  <100     mg/dL   Optimal  100-129  mg/dL   Near or Above                    Optimal  130-159  mg/dL   Borderline  160-189  mg/dL   High  >190     mg/dL   Very High Performed at [facility] [address], [place], [place] [address]   TSH     Status: None   Collection Time: 07/14/20  6:23 PM  Result Value Ref Range   TSH 3.632 0.350 - 4.500 uIU/mL    Comment: Performed by a 3rd Generation assay with a functional sensitivity of <=0.01 uIU/mL. Performed at [facility], [place] [address], [place], [place] [address]   T4, free     Status: None   Collection Time: 07/14/20  6:23 PM  Result Value Ref Range   Free T4 0.74 0.61 - 1.12 ng/dL    Comment: (NOTE) Biotin ingestion may interfere with free T4 tests. If the results are inconsistent  with the TSH level, previous test results, or the clinical presentation, then consider biotin interference. If needed, order repeat testing after stopping biotin. Performed at [facility], [place] [address] [place], [place] [address]   Hemoglobin A1c     Status: None   Collection Time: 07/14/20  6:23 PM  Result Value Ref Range   Hgb A1c MFr Bld 5.4 4.8 - 5.6 %    Comment: (NOTE)         Prediabetes: 5.7 - 6.4         Diabetes: >6.4         Glycemic control for adults with diabetes: <7.0    Mean Plasma Glucose 108 mg/dL    Comment: (NOTE) Performed At: [facility] [place], [place] [id number] Rush Farmer MD [id number]   LDL cholesterol, direct     Status: None   Collection Time: 07/14/20  6:23 PM  Result Value Ref Range   Direct LDL 90.6 0 - 99 mg/dL    Comment: Performed at [facility], [place] [address], [place], [place] [address]    Blood Alcohol level:  Lab Results  Component Value Date   ETH 251 (H) 07/10/2020   ETH 143 (H) 23/76/2831    Metabolic Disorder Labs: Lab Results  Component Value Date   HGBA1C 5.4 07/14/2020   MPG 108 07/14/2020   No results found for: PROLACTIN Lab Results  Component Value Date   CHOL 188 07/14/2020  TRIG 591 (H) 07/14/2020   HDL 45 07/14/2020   CHOLHDL 4.2 07/14/2020   VLDL UNABLE TO CALCULATE IF TRIGLYCERIDE OVER 400 mg/dL 07/14/2020   LDLCALC UNABLE TO CALCULATE IF TRIGLYCERIDE OVER 400 mg/dL 07/14/2020    Physical Findings: AIMS:  , ,  ,  ,    CIWA:  CIWA-Ar Total: 1 COWS:     Musculoskeletal: Strength & Muscle Tone: within normal limits Gait & Station: normal Patient leans: N/A  Psychiatric Specialty Exam:  Presentation  General Appearance: Appropriate for Environment  Eye Contact:Good  Speech:Clear and Coherent; Other (comment) (Hyperverbal)  Speech Volume:Normal  Handedness:Right   Mood and Affect  Mood:Irritable; Anxious  Affect:Congruent   Thought Process   Thought Processes:Coherent; Goal Directed  Descriptions of Associations:Tangential  Orientation:Full (Time, Place and Person)  Thought Content:Tangential; Rumination  History of Schizophrenia/Schizoaffective disorder:No  Duration of Psychotic Symptoms:No data recorded Hallucinations:Hallucinations: None  Ideas of Reference:None  Suicidal Thoughts:Suicidal Thoughts: No  Homicidal Thoughts:Homicidal Thoughts: Yes, Passive (Chronic intermittent thoughts of wanting to harm ex-significant other without intent or plan) HI Passive Intent and/or Plan: Without Intent; Without Plan   Sensorium  Memory:Immediate Fair; Recent Fair; Remote Fair  Judgment:Fair  Insight:Fair   Executive Functions  Concentration:Fair  Attention Span:Fair  [place]  Language:Good   Psychomotor Activity  Psychomotor Activity:Psychomotor Activity: Normal   Assets  Assets:Communication Skills; Desire for Improvement; Resilience   Sleep  Sleep:Sleep: Good Number of Hours of Sleep: 6.5    Physical Exam: Physical Exam Vitals and nursing note reviewed.  Constitutional:      General: He is not in acute distress.    Appearance: Normal appearance. He is not diaphoretic.  HENT:     Head: Normocephalic and atraumatic.  Pulmonary:     Effort: Pulmonary effort is normal.  Neurological:     General: No focal deficit present.     Mental Status: He is alert and oriented to person, place, and time.   Review of Systems  Constitutional:  Negative for chills, diaphoresis and fever.  HENT:  Negative for sore throat.   Respiratory:  Positive for cough. Negative for shortness of breath.        Chronic intermittent cough for months  Cardiovascular:  Negative for chest pain and palpitations.  Gastrointestinal:  Positive for heartburn and nausea. Negative for constipation, diarrhea and vomiting.  Musculoskeletal:  Negative for myalgias.  Neurological:  Negative for  dizziness and tremors.  Psychiatric/Behavioral:  Positive for depression and substance abuse. Negative for hallucinations and suicidal ideas. The patient is nervous/anxious. The patient does not have insomnia.   Blood pressure 110/85, pulse (!) 59, temperature 97.7 F (36.5 C), temperature source Oral, resp. rate 14, height 5' 4" (1.626 m), weight 70.3 kg, SpO2 99 %. Body mass index is 26.61 kg/m.   Treatment Plan Summary: Daily contact with patient to assess and evaluate symptoms and progress in treatment and Medication management  Continue every 15-minute safety checks  Encouraged participation in group therapy and therapeutic milieu  Alcohol use disorder -Continue CIWA protocol with PRN lorazepam for CIWA scores greater than 10 and comfort medications. -Patient is interested in going to residential substance abuse treatment program on discharge.  Depression -Continue Zoloft 50 mg daily for depression and anxiety -Increase Depakote DR to 250 mg each morning and 500 mg at bedtime for mood stabilization/irritability. Consider further upward titration.  Anxiety -Continue hydroxyzine 25 mg TID PRN  Hypertension -Continue lisinopril 5 mg daily  Insomnia -Continue hydroxyzine 50  mg QHS PRN insomnia  GERD -Continue Protonix 40 mg daily  Discharge planning in progress   Arthor Captain, MD 07/16/2020, 1:02 PM

## 2020-07-16 NOTE — Progress Notes (Addendum)
D:  Patient stated he does have HI thoughts to hurt his ex and ex's mother because of the way they have treated him.  Denied SI, contracts for safety.  Denied A/V hallucinations. A:  Medications administered per MD orders.  Emotional support and encouragement given patient. R:  Safety maintained with 15 minute checks.  Patient's self inventory sheet, patient has poor sleep, sleep medication not helpful.  Good appetite, normal energy level, good concentration.  Rated depression 3, hopeless 2, anxiety 7.  Withdrawals, cravings.  Denied SI.  Physical problems, headaches.  Goal is go to [facility].  No discharge plans.

## 2020-07-17 ENCOUNTER — Other Ambulatory Visit: Payer: Self-pay

## 2020-07-17 MED ORDER — HYDROXYZINE HCL 50 MG PO TABS
50.0000 mg | ORAL_TABLET | Freq: Every evening | ORAL | 0 refills | Status: DC | PRN
  Filled 2020-07-17 – 2020-07-21 (×2): qty 30, 30d supply, fill #0

## 2020-07-17 MED ORDER — DIVALPROEX SODIUM 250 MG PO DR TAB
DELAYED_RELEASE_TABLET | ORAL | 0 refills | Status: DC
  Filled 2020-07-17 – 2020-07-21 (×2): qty 90, 30d supply, fill #0

## 2020-07-17 MED ORDER — PANTOPRAZOLE SODIUM 40 MG PO TBEC
40.0000 mg | DELAYED_RELEASE_TABLET | Freq: Every day | ORAL | 0 refills | Status: AC
  Filled 2020-07-17 – 2020-07-24 (×4): qty 30, 30d supply, fill #0

## 2020-07-17 MED ORDER — LISINOPRIL 5 MG PO TABS
5.0000 mg | ORAL_TABLET | Freq: Every day | ORAL | 0 refills | Status: AC
  Filled 2020-07-17 – 2020-07-21 (×2): qty 30, 30d supply, fill #0

## 2020-07-17 MED ORDER — SERTRALINE HCL 50 MG PO TABS
50.0000 mg | ORAL_TABLET | Freq: Every day | ORAL | 0 refills | Status: DC
  Filled 2020-07-17 – 2020-07-21 (×2): qty 30, 30d supply, fill #0

## 2020-07-17 MED ORDER — TRAZODONE HCL 100 MG PO TABS
100.0000 mg | ORAL_TABLET | Freq: Every evening | ORAL | 0 refills | Status: DC | PRN
  Filled 2020-07-17: qty 30, 30d supply, fill #0

## 2020-07-17 MED ORDER — HYDROXYZINE HCL 25 MG PO TABS
25.0000 mg | ORAL_TABLET | Freq: Three times a day (TID) | ORAL | 0 refills | Status: DC | PRN
  Filled 2020-07-17 – 2020-07-24 (×3): qty 75, 25d supply, fill #0

## 2020-07-17 NOTE — [facility] Group Notes (Signed)
[facility] Group Notes:  (Nursing/MHT/Case Management/Adjunct)  Date:  07/17/2020  Time:  9:46 AM  Type of Therapy:   Goals group  Participation Level:  Active  Participation Quality:  Attentive and Sharing  Affect:  Anxious  Cognitive:  Alert and Appropriate  Insight:  Improving  Engagement in Group:  Distracting and Engaged  Modes of Intervention:  Discussion and Education  Summary of Progress/Problems:  Joshua [name] reported his goal for today is "possibly go home."  He slept "alright" with medications.    Norm Parcel Irfan Veal 07/17/2020, 9:46 AM

## 2020-07-17 NOTE — Progress Notes (Signed)
RN met with pt and reviewed pt's discharge instructions. Pt verbalized understanding of discharge instructions and pt did not have any questions. RN reviewed and provided pt with a copy of SRA, AVS and Transition Record. RN returned pt's belongings to pt.  Medication samples were given to pt. Pt denied SI/HI/AVH and voiced no concerns. Pt was appreciative of the care pt received at [facility]. Patient discharged to the lobby without incident.  ?

## 2020-07-17 NOTE — [facility] Suicide Risk Assessment (Signed)
[facility] Discharge Suicide Risk Assessment   Principal Problem: Major depressive disorder, recurrent severe without psychotic features ([facility]) Discharge Diagnoses: Principal Problem:   Major depressive disorder, recurrent severe without psychotic features ([facility]) Active Problems:   Alcohol use disorder, severe, dependence ([facility])   Total Time spent with patient: 20 minutes  Musculoskeletal: Strength & Muscle Tone: within normal limits Gait & Station: normal Patient leans: N/A  Psychiatric Specialty Exam  Presentation  General Appearance: Appropriate for Environment  Eye Contact:Good  Speech:Clear and Coherent  Speech Volume:Normal  Handedness:Right   Mood and Affect  Mood:Euthymic; Anxious (less anxious)  Duration of Depression Symptoms: Greater than two weeks  Affect:Appropriate; Congruent   Thought Process  Thought Processes:Coherent; Goal Directed  Descriptions of Associations:Intact  Orientation:Full (Time, Place and Person)  Thought Content:Logical  History of Schizophrenia/Schizoaffective disorder:No  Duration of Psychotic Symptoms:No data recorded Hallucinations:Hallucinations: None  Ideas of Reference:None  Suicidal Thoughts:Suicidal Thoughts: No  Homicidal Thoughts:Homicidal Thoughts: No.  Denies thoughts of wanting to harm others.   Sensorium  Memory:Immediate Good; Recent Good  Judgment:Fair  Insight:Fair   Executive Functions  Concentration:Fair  Attention Span:Fair  Recall:Good  Fund of Knowledge:Fair  Language:Good   Psychomotor Activity  Psychomotor Activity:Psychomotor Activity: Normal   Assets  Assets:Communication Skills; Desire for Improvement; Housing; Resilience; Social Support   Sleep  Sleep:Sleep: Good Number of Hours of Sleep: 6.75   Physical Exam: Physical Exam Vitals and nursing note reviewed.  Constitutional:      General: He is not in acute distress.    Appearance: Normal appearance. He is not  diaphoretic.  HENT:     Head: Normocephalic and atraumatic.  Pulmonary:     Effort: Pulmonary effort is normal.  Neurological:     General: No focal deficit present.     Mental Status: He is alert and oriented to person, place, and time.   Review of Systems  Constitutional: Negative.   HENT:  Negative for sore throat.   Respiratory:  Negative for shortness of breath.   Cardiovascular:  Negative for chest pain and palpitations.  Gastrointestinal:  Negative for constipation, diarrhea and nausea.  Musculoskeletal: Negative.   Skin: Negative.   Neurological:  Negative for dizziness, tremors and headaches.  Psychiatric/Behavioral:  Negative for depression, hallucinations and suicidal ideas. The patient is nervous/anxious. The patient does not have insomnia.   All other systems reviewed and are negative.  Vital signs at 6:20 AM on 07/17/2020: BP 119/83 sitting and 113/75 standing; pulse 63 sitting and 58 standing; O2 sat 100% on room air; and temperature of 98.2. Blood pressure 113/75, pulse (!) 58, temperature 98.2 F (36.8 C), temperature source Oral, resp. rate 14, height 5\' 4"  (1.626 m), weight 70.3 kg, SpO2 99 %. Body mass index is 26.61 kg/m.  Mental Status Per Nursing Assessment::   On Admission:  Suicidal ideation indicated by others  Demographic Factors:  Male, Caucasian, and Low socioeconomic status  Loss Factors: Loss of significant relationship and Financial problems/change in socioeconomic status  Historical Factors: Prior suicide attempts and Impulsivity  Risk Reduction Factors:   Responsible for children under 34 years of age, Sense of responsibility to family, Living with another person, especially a relative, and Positive social support  Continued Clinical Symptoms:  Anxiety - improved Depression/irritability- improved Alcohol/Substance Abuse/Dependencies  Cognitive Features That Contribute To Risk:  None    Suicide Risk:  Minimal acute: No identifiable  suicidal ideation.  Patients presenting with no risk factors but with morbid ruminations; may be classified as minimal  risk based on the severity of the depressive symptoms   Follow-up Information     [facility] Follow up.   Why: A referral was made to this facility for substance use treatment on your behalf. Please call daily to check on bed openings. Contact information: [address] [place] [address] [phone]         [facility] Go on 07/21/2020.   Why: You have a hospital follow up appointment for therapy and medication management services on 07/21/20 at 2:30 pm.  This appointment will be held in person. Contact information: [address] [place] [address] [phone]         [facility] Follow up.   Specialty: Addiction Medicine Why: You have been accepted to this facility pending a letter from your lawyer. Please call daily to check on bed openings. Contact information: [address] [place] [address] [phone]                 Plan Of Care/Follow-up recommendations:  Activity:  as tolerated  Tests:  You periodically will need to have blood drawn for lab work to check liver function, blood count and Depakote level while you are taking Depakote.  Your outpatient doctor will let you know when lab work needs to be performed.  Other:   -Take medications as prescribed.   -Do not drink alcohol.  Do not use marijuana/cannabis or other drugs.   -Attend residential substance abuse treatment program and 12-step groups.   -Keep outpatient mental health follow-up appointments with therapist and psychiatrist.   -See primary care provider for treatment of high blood pressure, esophageal reflux and other medical conditions.   -Ask primary care provider to refer you to sleep medicine clinic for sleep study to assess for possible sleep apnea.  Claudie Revering,  MD 07/17/2020, 9:02 AM

## 2020-07-17 NOTE — Discharge Summary (Signed)
Physician Discharge Summary Note  Patient:  Joshua [name] is an 35 y.o., male MRN:  [id number] DOB:  08-17-1985 Patient phone:  [phone] (home)  Patient address:   [address] [place] [address],  Total Time spent with patient:  Greater than 30 minutes  Date of Admission:  07/12/2020 Date of Discharge: 07-17-20  Reason for Admission: Worsening suicidal ideations with hx of previous attempt by hanging.  Principal Problem: Major depressive disorder, recurrent severe without psychotic features [facility])  Discharge Diagnoses: Principal Problem:   Major depressive disorder, recurrent severe without psychotic features (HCC) Active Problems:   Alcohol use disorder, severe, dependence (HCC)  Past Psychiatric History: Alcohol use disorder, dependence, Major depressive disorder.  Past Medical History:  Past Medical History:  Diagnosis Date   ADD (attention deficit disorder)    Depression    Paranoid (HCC)     Past Surgical History:  Procedure Laterality Date   HERNIA REPAIR     Family History: History reviewed. No pertinent family history.  Family Psychiatric  History: See H&P.  Social History:  Social History   Substance and Sexual Activity  Alcohol Use Yes   Comment: occassional 24oz beer at night     Social History   Substance and Sexual Activity  Drug Use No    Social History   Socioeconomic History   Marital status: Single    Spouse name: Not on file   Number of children: Not on file   Years of education: Not on file   Highest education level: Not on file  Occupational History   Not on file  Tobacco Use   Smoking status: Every Day    Packs/day: 0.50    Pack years: 0.00    Types: Cigarettes   Smokeless tobacco: Never  Substance and Sexual Activity   Alcohol use: Yes    Comment: occassional 24oz beer at night   Drug use: No   Sexual activity: Not on file  Other Topics Concern   Not on file  Social History Narrative   Not on file   Social  Determinants of Health   Financial Resource Strain: Not on file  Food Insecurity: Not on file  Transportation Needs: Not on file  Physical Activity: Not on file  Stress: Not on file  Social Connections: Not on file   Hospital Course: (Per Md's admission evaluation notes): Patient is a 35 year old male with a past psychiatric history significant for alcohol dependence, alcohol withdrawal, 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  The patient stated at that time that he was suicidal.  He admitted that he had attempted to harm himself in the past by hanging.  Psychiatric consultation was obtained, and the patient stated that he wanted to kill himself.  He stated he was unable to see his daughter because of DSS, and also his previous significant other had placed a restraining order on him.  His blood alcohol on evaluation was 251.  On examination today the patient was unable to quantitate the amount of alcohol that he had been drinking.  On examination today the patient stated that this would be his third psychiatric hospitalization.  He had been hospitalized on similar circumstances back in March 2022.  He was hospitalized there for 5 days.  He was discharged on bupropion, hydroxyzine, lisinopril, naltrexone and trazodone.  On examination today the patient stated  that he came into the hospital because he has a hearing with DSS on 7/18.  He stated he had spoken with social services last week sometime, and they made recommendations for substance residential treatment.  He stated he wanted to be able to see his daughter, and the only way he was going to be able to do that was to get into a residential treatment program and get sober.  Review of the electronic medical record revealed that the patient had been offered residential treatment when he had been  hospitalized at [facility] in March, but had declined it at that time.  He currently denies suicidal ideation.  He stated he had been compliant with the bupropion and his other medications since discharge and had been seen apparently at [facility].  Reconsultation was done on 07/12/2020.  He was transferred to our facility for evaluation and stabilization.   Prior to this discharge, Sehaj was seen & evaluated for mental health stability. The current laboratory findings were reviewed (stable), nurses notes & vital signs were reviewed as well. There are no current mental health or medical issues that should prevent this discharge at this time. Patient is being discharged to continue mental health care as noted below.   This is the second psychiatric admission/discharge summaries for this 35 year old male from the [facility] health systems. He was a patient at the [facility] from Mar. 23rd through Mar 28th of 2022 for mood stabilization treatments for worsening suicidal ideations with plan or an attempt to hang himself. Treylin has a previous hx of mental illness & polysubstance substance abuse issues including alcohol. He was brought to the hospital this time around for evaluation & treatments of worsening depression & suicidal ideations. He cited as his trigger, the inability to see his child due to DSS involvement & his baby-mama filing a restraining orders against him. He was in need of mood stabilization treatments. His toxicology reports on admission was 251. He did not receive any detoxification treatments because he never did develop any alcohol withdrawal symptoms.   After evaluation of his presenting symptoms, it was jointly agreed by the treatment team  to recommend [facility] for mood stabilization treatments. The medication regimen targeting those presenting symptoms were discussed & initiated with his consent. He was stabilized on the medications as listed below on his discharge medication lists He was also enrolled &  participated in the group counseling sessions being offered & held on this unit. He learned coping skills. He presented other significant pre-existing medical issues that required treatment & or monitoring. He was resumed & discharged on all his pertinent home medications for those health issues. He tolerated his treatment regimen without any adverse effects or reactions reported.    Stanislaus's symptoms responded well to his treatment regimen warranting this discharge. This is evidenced by his reports of improved mood, resolution of symptoms & presentation of good affect/eye contact. He is currently mentally & medically stable for discharge to continue mental health care as recommended below. Patient is agreeable to this discharge at this time.   Today upon the discharge evaluation with the attending psychiatrist, Shadrach shares he is feeling much better than when first admitted. He denies any specific concerns. He is sleeping well. His appetite is good. He denies other physical complaints. He denies SI/HI/AH/VH, delusions or paranoia. He is tolerating his medications well & in agreement to continue his current regimen as noted on the discharge medication lists below. He will follow up for routine psychiatric care &  medication management as noted below. He is provided with all the necessary information needed to make these appointments without problems. He was able to engage in safety planning including plan to return to [place] or contact emergency services if he feels unable to maintain his own safety or the safety of others. Pt had no further questions, comments or concerns. He left [facility] with all personal belongings in no apparent distress. Transportation per the [employer].    Physical Findings: AIMS:  , ,  ,  ,    CIWA:  CIWA-Ar Total: 1 COWS:     Musculoskeletal: Strength & Muscle Tone: within normal limits Gait & Station: normal Patient leans: N/A  Psychiatric Specialty  Exam:  Presentation  General Appearance: Appropriate for Environment  Eye Contact:Good  Speech:Clear and Coherent  Speech Volume:Normal  Handedness:Right  Mood and Affect  Mood:Euthymic; Anxious (less anxious)  Affect:Appropriate; Congruent  Thought Process  Thought Processes:Coherent; Goal Directed  Descriptions of Associations:Intact  Orientation:Full (Time, Place and Person)  Thought Content:Logical  History of Schizophrenia/Schizoaffective disorder:No  Duration of Psychotic Symptoms:No data recorded Hallucinations:Hallucinations: None  Ideas of Reference:None  Suicidal Thoughts:Suicidal Thoughts: No  Homicidal Thoughts:Homicidal Thoughts: No HI Passive Intent and/or Plan: Without Intent; Without Plan  Sensorium  Memory:Immediate Good; Recent Good  Judgment:Fair  Insight:Fair  Executive Functions  Concentration:Fair  Attention Span:Fair  Recall:Good  Fund of Knowledge:Fair  Language:Good  Psychomotor Activity  Psychomotor Activity:Psychomotor Activity: Normal  Assets  Assets:Communication Skills; Desire for Improvement; Housing; Resilience; Social Support  Sleep  Sleep:Sleep: Good Number of Hours of Sleep: 6.75  Physical Exam: Physical Exam Vitals and nursing note reviewed.  HENT:     Head: Normocephalic.     Nose: Nose normal.     Mouth/Throat:     Pharynx: Oropharynx is clear.  Eyes:     Pupils: Pupils are equal, round, and reactive to light.  Cardiovascular:     Rate and Rhythm: Normal rate.  Pulmonary:     Effort: Pulmonary effort is normal.  Genitourinary:    Comments: Deferred Musculoskeletal:        General: Normal range of motion.     Cervical back: Normal range of motion.  Skin:    General: Skin is warm and dry.  Neurological:     General: No focal deficit present.     Mental Status: He is alert and oriented to person, place, and time. Mental status is at baseline.   Review of Systems  Constitutional: Negative.    HENT: Negative.    Eyes: Negative.   Respiratory: Negative.    Cardiovascular: Negative.   Gastrointestinal: Negative.   Genitourinary: Negative.   Musculoskeletal: Negative.   Skin: Negative.   Neurological:  Negative for dizziness, tingling, tremors, sensory change, speech change, focal weakness, seizures, loss of consciousness, weakness and headaches.  Endo/Heme/Allergies:        Allergies: NKDA  Psychiatric/Behavioral:  Positive for depression (Hx. of (stable on medication)) and substance abuse (Hx. alcoholism (stable upon discharge)). Negative for hallucinations, memory loss and suicidal ideas. The patient is not nervous/anxious (Stable upon discharge) and does not have insomnia.   Blood pressure 113/75, pulse (!) 58, temperature 98.2 F (36.8 C), temperature source Oral, resp. rate 14, height  (1.626 m), weight 70.3 kg, SpO2 99 %. Body mass index is 26.61 kg/m.   Social History   Tobacco Use  Smoking Status Every Day   Packs/day: 0.50   Pack years: 0.00   Types: Cigarettes  Smokeless Tobacco  Never   Tobacco Cessation:  N/A, patient does not currently use tobacco products  Blood Alcohol level:  Lab Results  Component Value Date   ETH 251 (H) 07/10/2020   ETH 143 (H) 04/01/2020   Metabolic Disorder Labs:  Lab Results  Component Value Date   HGBA1C 5.4 07/14/2020   MPG 108 07/14/2020   No results found for: PROLACTIN Lab Results  Component Value Date   CHOL 188 07/14/2020   TRIG 591 (H) 07/14/2020   HDL 45 07/14/2020   CHOLHDL 4.2 07/14/2020   VLDL UNABLE TO CALCULATE IF TRIGLYCERIDE OVER 400 mg/dL 11/91/478207/04/2020   LDLCALC UNABLE TO CALCULATE IF TRIGLYCERIDE OVER 400 mg/dL 95/62/130807/04/2020   See Psychiatric Specialty Exam and Suicide Risk Assessment completed by Attending Physician prior to discharge.  Discharge destination:  Home  Is patient on multiple antipsychotic therapies at discharge:  No   Has Patient had three or more failed trials of antipsychotic  monotherapy by history:  No  Recommended Plan for Multiple Antipsychotic Therapies: NA  Allergies as of 07/17/2020   No Known Allergies      Medication List     STOP taking these medications    buPROPion 150 MG 24 hr tablet Commonly known as: WELLBUTRIN XL   buPROPion 300 MG 24 hr tablet Commonly known as: WELLBUTRIN XL   naltrexone 50 MG tablet Commonly known as: DEPADE   traZODone 100 MG tablet Commonly known as: DESYREL       TAKE these medications      Indication  divalproex 250 MG DR tablet Commonly known as: DEPAKOTE Take 1 tablet (250 mg) by mouth in the morning & 2 tablets (500 mg) at bedtime for mood stabilization  Indication: Mood stabilization   hydrOXYzine 25 MG tablet Commonly known as: ATARAX/VISTARIL Take 1 tablet (25 mg total) by mouth 3 (three) times daily as needed for anxiety. What changed:  medication strength how much to take when to take this reasons to take this  Indication: Feeling Anxious   hydrOXYzine 50 MG tablet Commonly known as: ATARAX/VISTARIL Take 1 tablet (50 mg total) by mouth at bedtime as needed (sleep). What changed: You were already taking a medication with the same name, and this prescription was added. Make sure you understand how and when to take each.  Indication: Insomnia   ibuprofen 200 MG tablet Commonly known as: ADVIL Take 200 mg by mouth every 6 (six) hours as needed for mild pain, moderate pain or headache.  Indication: Fever, Pain   lisinopril 5 MG tablet Commonly known as: ZESTRIL Take 1 tablet (5 mg total) by mouth daily. For high blood pressure Start taking on: July 18, 2020 What changed:  how much to take additional instructions  Indication: High Blood Pressure Disorder   pantoprazole 40 MG tablet Commonly known as: PROTONIX Take 1 tablet (40 mg total) by mouth daily. For acid reflux Start taking on: July 18, 2020  Indication: Gastroesophageal Reflux Disease   sertraline 50 MG tablet Commonly  known as: ZOLOFT Take 1 tablet (50 mg total) by mouth daily. For depression Start taking on: July 18, 2020  Indication: Major Depressive Disorder        Follow-up Information     [facility] Follow [facility].   Why: A referral was made to this facility for substance use treatment on your behalf. Please call daily to check on bed openings. Contact information: [address] [place] [address] [phone]  [facility] Go on 07/21/2020.   Why: You have a hospital follow up appointment for therapy and medication management services on 07/21/20 at 2:30 pm.  This appointment will be held in person. Contact information: [address] [place] [address] [phone]         [facility] Follow up.   Specialty: Addiction Medicine Why: You have been accepted to this facility pending a letter from your lawyer. Please call daily to check on bed openings. Contact information: [address] [place] [address] [phone]                Follow-up recommendations: Activity:  As tolerated Diet: As recommended by your primary care doctor. Keep all scheduled follow-up appointments as recommended.    Comments: Prescriptions given at discharge.  Patient agreeable to plan.  Given opportunity to ask questions.  Appears to feel comfortable with discharge denies any current suicidal or homicidal thought. Patient is also instructed prior to discharge to: Take all medications as prescribed by his/her mental healthcare provider. Report any adverse effects and or reactions from the medicines to his/her outpatient provider promptly. Patient has been instructed & cautioned: To not engage in alcohol and or illegal drug use while on prescription medicines. In the event of worsening symptoms, patient is instructed to call the crisis hotline, 911 and or go to the nearest ED for appropriate  evaluation and treatment of symptoms. To follow-up with his/her primary care provider for your other medical issues, concerns and or health care needs.   Signed: Armandina Stammer, NP, pmhnp, fnp-bc 07/17/2020, 10:54 AM

## 2020-07-18 NOTE — Progress Notes (Signed)
  [facility] Adult Case Management Discharge Plan :  Will you be returning to the same living situation after discharge:  Yes,  Home  At discharge, do you have transportation home?: Yes,  Safe Transport  Do you have the ability to pay for your medications: Yes,  Friends and family  Release of information consent forms completed and in the chart;  Patient's signature needed at discharge.  Patient to Follow up at:  Follow-up Information     [facility] Follow up.   Why: A referral was made to this facility for substance use treatment on your behalf. Please call daily to check on bed openings. Contact information: [address] [place] [address] [phone]         [facility] Go on 07/21/2020.   Why: You have a hospital follow up appointment for therapy and medication management services on 07/21/20 at 2:30 pm.  This appointment will be held in person. Contact information: [address] [place] [address] [phone]         [facility] Follow up.   Specialty: Addiction Medicine Why: You have been accepted to this facility pending a letter from your lawyer. Please call daily to check on bed openings. Contact information: [address] [place] [address] [phone]                 Next level of care provider has access to [facility] Link:no  Safety Planning and Suicide Prevention discussed: Yes,  with patient and friend      Has patient been referred to the Quitline?: Patient refused referral  Patient has been referred for addiction treatment: Yes  Aram Beecham, LCSWA 07/18/2020, 8:09 AM

## 2020-07-21 ENCOUNTER — Other Ambulatory Visit: Payer: Self-pay

## 2020-07-22 ENCOUNTER — Other Ambulatory Visit: Payer: Self-pay

## 2020-07-24 ENCOUNTER — Other Ambulatory Visit: Payer: Self-pay

## 2020-07-29 ENCOUNTER — Other Ambulatory Visit: Payer: Self-pay

## 2020-08-20 ENCOUNTER — Other Ambulatory Visit: Payer: Self-pay | Admitting: Psychiatry

## 2020-08-20 ENCOUNTER — Other Ambulatory Visit: Payer: Self-pay

## 2020-08-21 ENCOUNTER — Other Ambulatory Visit: Payer: Self-pay | Admitting: Psychiatry

## 2020-08-21 ENCOUNTER — Other Ambulatory Visit: Payer: Self-pay

## 2020-08-25 ENCOUNTER — Other Ambulatory Visit: Payer: Self-pay

## 2020-08-26 ENCOUNTER — Other Ambulatory Visit: Payer: Self-pay

## 2020-08-27 ENCOUNTER — Other Ambulatory Visit: Payer: Self-pay

## 2020-08-27 ENCOUNTER — Ambulatory Visit: Payer: Self-pay | Admitting: Pharmacy Technician

## 2020-08-27 DIAGNOSIS — Z79899 Other long term (current) drug therapy: Secondary | ICD-10-CM

## 2020-08-28 ENCOUNTER — Other Ambulatory Visit: Payer: Self-pay

## 2020-08-29 ENCOUNTER — Other Ambulatory Visit: Payer: Self-pay

## 2020-08-29 NOTE — Progress Notes (Signed)
Completed [facility] application and contract.  Patient agreed to all terms of the [facility] contract.    Patient approved to receive medication assistance at [facility] until time for re-certification in 2023, and as long as eligibility criteria continues to be met.    Provided patient with community resource material based on his particular needs.    Zofia Peckinpaugh J. Vedansh Kerstetter Care Manager [facility]  

## 2020-09-04 ENCOUNTER — Other Ambulatory Visit: Payer: Self-pay

## 2020-09-30 ENCOUNTER — Other Ambulatory Visit: Payer: Self-pay

## 2020-10-01 ENCOUNTER — Other Ambulatory Visit: Payer: Self-pay

## 2020-11-25 ENCOUNTER — Other Ambulatory Visit: Payer: Self-pay

## 2020-11-25 MED ORDER — SERTRALINE HCL 50 MG PO TABS
ORAL_TABLET | ORAL | 1 refills | Status: AC
  Filled 2020-11-25: qty 30, 30d supply, fill #0

## 2020-11-25 MED ORDER — HYDROXYZINE HCL 25 MG PO TABS
ORAL_TABLET | ORAL | 1 refills | Status: AC
  Filled 2020-11-25: qty 90, 30d supply, fill #0

## 2020-11-25 MED ORDER — DIVALPROEX SODIUM 250 MG PO DR TAB
DELAYED_RELEASE_TABLET | ORAL | 1 refills | Status: DC
  Filled 2020-11-25: qty 90, 30d supply, fill #0

## 2020-12-27 ENCOUNTER — Other Ambulatory Visit: Payer: Self-pay

## 2020-12-27 DIAGNOSIS — Z046 Encounter for general psychiatric examination, requested by authority: Secondary | ICD-10-CM | POA: Insufficient documentation

## 2020-12-27 DIAGNOSIS — J069 Acute upper respiratory infection, unspecified: Secondary | ICD-10-CM | POA: Insufficient documentation

## 2020-12-27 DIAGNOSIS — F17210 Nicotine dependence, cigarettes, uncomplicated: Secondary | ICD-10-CM | POA: Insufficient documentation

## 2020-12-27 DIAGNOSIS — Z20822 Contact with and (suspected) exposure to covid-19: Secondary | ICD-10-CM | POA: Insufficient documentation

## 2020-12-27 DIAGNOSIS — F1014 Alcohol abuse with alcohol-induced mood disorder: Secondary | ICD-10-CM | POA: Insufficient documentation

## 2020-12-27 DIAGNOSIS — Y907 Blood alcohol level of 200-239 mg/100 ml: Secondary | ICD-10-CM | POA: Insufficient documentation

## 2020-12-27 DIAGNOSIS — R45851 Suicidal ideations: Secondary | ICD-10-CM | POA: Insufficient documentation

## 2020-12-27 LAB — CBC
HCT: 50 % (ref 39.0–52.0)
Hemoglobin: 16.8 g/dL (ref 13.0–17.0)
MCH: 30.9 pg (ref 26.0–34.0)
MCHC: 33.6 g/dL (ref 30.0–36.0)
MCV: 92.1 fL (ref 80.0–100.0)
Platelets: 227 10*3/uL (ref 150–400)
RBC: 5.43 MIL/uL (ref 4.22–5.81)
RDW: 13 % (ref 11.5–15.5)
WBC: 8.8 10*3/uL (ref 4.0–10.5)
nRBC: 0 % (ref 0.0–0.2)

## 2020-12-27 LAB — COMPREHENSIVE METABOLIC PANEL
ALT: 18 U/L (ref 0–44)
AST: 15 U/L (ref 15–41)
Albumin: 4.1 g/dL (ref 3.5–5.0)
Alkaline Phosphatase: 53 U/L (ref 38–126)
Anion gap: 6 (ref 5–15)
BUN: 10 mg/dL (ref 6–20)
CO2: 25 mmol/L (ref 22–32)
Calcium: 8.9 mg/dL (ref 8.9–10.3)
Chloride: 107 mmol/L (ref 98–111)
Creatinine, Ser: 0.85 mg/dL (ref 0.61–1.24)
GFR, Estimated: >60 mL/min (ref >60)
Glucose, Bld: 104 mg/dL — ABNORMAL HIGH (ref 70–99)
Potassium: 3.8 mmol/L (ref 3.5–5.1)
Sodium: 138 mmol/L (ref 135–145)
Total Bilirubin: 0.6 mg/dL (ref 0.3–1.2)
Total Protein: 6.8 g/dL (ref 6.5–8.1)

## 2020-12-27 LAB — SALICYLATE LEVEL: Salicylate Lvl: <7 mg/dL — ABNORMAL LOW (ref 7.0–30.0)

## 2020-12-27 LAB — ACETAMINOPHEN LEVEL: Acetaminophen (Tylenol), Serum: <10 ug/mL — ABNORMAL LOW (ref 10–30)

## 2020-12-27 LAB — ETHANOL: Alcohol, Ethyl (B): 222 mg/dL — ABNORMAL HIGH (ref <10)

## 2020-12-27 NOTE — ED Triage Notes (Signed)
Pt presents to ER with PD for possible psych eval.  When asked why pt is here, pt states "life."  Pt states he has been unable to see his daughter which has caused depressions and some suicidal ideations.  Pt A&O x4 at this time, eating cheetos in triage.  Pt in NAD at this time.

## 2020-12-28 ENCOUNTER — Emergency Department
Admission: EM | Admit: 2020-12-28 | Discharge: 2020-12-28 | Disposition: A | Discharge status: Home / Self Care | Payer: Federal, State, Local not specified - Other | Attending: Emergency Medicine | Admitting: Emergency Medicine

## 2020-12-28 ENCOUNTER — Emergency Department: Payer: Self-pay

## 2020-12-28 DIAGNOSIS — F1094 Alcohol use, unspecified with alcohol-induced mood disorder: Secondary | ICD-10-CM

## 2020-12-28 DIAGNOSIS — J069 Acute upper respiratory infection, unspecified: Secondary | ICD-10-CM

## 2020-12-28 LAB — RESP PANEL BY RT-PCR (FLU A&B, COVID) ARPGX2
Influenza A by PCR: NEGATIVE
Influenza B by PCR: NEGATIVE
SARS Coronavirus 2 by RT PCR: NEGATIVE

## 2020-12-28 MED ORDER — RISPERIDONE 1 MG PO TABS
0.5000 mg | ORAL_TABLET | Freq: Two times a day (BID) | ORAL | Status: DC
  Administered 2020-12-28: 14:00:00 0.5 mg via ORAL
  Filled 2020-12-28: qty 1

## 2020-12-28 MED ORDER — RISPERIDONE 0.5 MG PO TABS
0.5000 mg | ORAL_TABLET | Freq: Two times a day (BID) | ORAL | 1 refills | Status: AC
  Filled 2020-12-28 – 2020-12-31 (×2): qty 60, 30d supply, fill #0

## 2020-12-28 MED ORDER — SERTRALINE HCL 50 MG PO TABS
25.0000 mg | ORAL_TABLET | Freq: Every day | ORAL | Status: DC
  Administered 2020-12-28: 14:00:00 25 mg via ORAL
  Filled 2020-12-28: qty 1

## 2020-12-28 MED ORDER — IBUPROFEN 600 MG PO TABS
600.0000 mg | ORAL_TABLET | Freq: Once | ORAL | Status: AC
  Administered 2020-12-28: 10:00:00 600 mg via ORAL
  Filled 2020-12-28: qty 1

## 2020-12-28 NOTE — BH Assessment (Addendum)
Comprehensive Clinical Assessment (CCA) Note  12/28/2020 Joshua [name] [id number]  Chief Complaint:  Chief Complaint  Patient presents with   Psychiatric Evaluation   Visit Diagnosis: Alcohol Induced Mood Disorder   Joshua [name] is a 35 year old male who presents to the ER with passive SI. He reports of having no plan, intentions or gestures. He further reports his medications are no longer working and he spoke with his outpatient provider but they told him to keep taking them. He states, he has stopped because they no longer work. Thus, he came to the ER to get them adjusted. He is unable to see his daughter and that is the stressor at this time. He's allowed to see her but he doesn't have transportation to get to her. She currently lives in [place] with the mother.  During the interview the patient was calm, cooperative and pleasant. He was able to provide appropriate answers to the questions. Throughout the interview, he denied SI/HI and AV/H. He admits to the use of alcohol and the last time of use was prior to him coming to the ER. Upon arrival his BAC was 222. Patient was fixated on discharging from the ER so he can go to work. Patient works night shift, which is why he is unable to sleep. He is still adjusting to sleeping during the day.  CCA Screening, Triage and Referral (STR)  Patient Reported Information How did you hear about [place]? Self  What Is the Reason for Your Visit/Call Today? Having SI with no plans, seeking to get medications adjusted.  How Long Has This Been Causing You Problems? 1 wk - 1 month  What Do You Feel Would Help You the Most Today? Treatment for Depression or other mood problem; Alcohol or Drug Use Treatment   Have You Recently Had Any Thoughts About Hurting Yourself? Yes  Are You Planning to Commit Suicide/Harm Yourself At This time? No   Have you Recently Had Thoughts About [place]? No  Are You Planning to Harm Someone at This  Time? No  Explanation: No data recorded  Have You Used Any Alcohol or Drugs in the Past 24 Hours? Yes  How Long Ago Did You Use Drugs or Alcohol? No data recorded What Did You Use and How Much? Alcohol   Do You Currently Have a Therapist/Psychiatrist? Yes  Name of Therapist/Psychiatrist: [place] Recently Discharged From Any Office Practice or Programs? No  Explanation of Discharge From Practice/Program: No data recorded    CCA Screening Triage Referral Assessment Type of Contact: Face-to-Face  Telemedicine Service Delivery:   Is this Initial or Reassessment? No data recorded Date Telepsych consult ordered in CHL:  No data recorded Time Telepsych consult ordered in CHL:  No data recorded Location of Assessment: [facility] ED  Provider Location: [facility] ED   Collateral Involvement: No data recorded  Does Patient Have a [place]? No data recorded Name and Contact of Legal Guardian: No data recorded If Minor and Not Living with Parent(s), Who has Custody? n/a  Is CPS involved or ever been involved? Never  Is APS involved or ever been involved? Never   Patient Determined To Be At Risk for Harm To Self or Others Based on Review of Patient Reported Information or Presenting Complaint? No  Method: No data recorded Availability of Means: No data recorded Intent: No data recorded Notification Required: No data recorded Additional Information for Danger to Others Potential: No data recorded Additional Comments  for Danger to Others Potential: No data recorded Are There Guns or Other Weapons in Your Home? No data recorded Types of Guns/Weapons: No data recorded Are These Weapons Safely Secured?                            No data recorded Who Could Verify You Are Able To Have These Secured: No data recorded Do You Have any Outstanding Charges, Pending Court Dates, Parole/Probation? No data recorded Contacted To Inform of Risk of Harm To Self  or Others: No data recorded   Does Patient Present under Involuntary Commitment? Yes  IVC Papers Initial File Date: 12/28/20   [place] of Residence: [place]   Patient Currently Receiving the Following Services: Medication Management; Peer Support Services   Determination of Need: Emergent (2 hours)   Options For Referral: ED Visit     CCA Biopsychosocial Patient Reported Schizophrenia/Schizoaffective Diagnosis in Past: No   Strengths: Have some insight, seeking help and have support   Mental Health Symptoms Depression:   Sleep (too much or little)   Duration of Depressive symptoms:  Duration of Depressive Symptoms: Greater than two weeks (Due to his work schedule)   Mania:   N/A   Anxiety:    N/A   Psychosis:   None   Duration of Psychotic symptoms:    Trauma:   N/A   Obsessions:   N/A   Compulsions:   N/A   Inattention:   N/A   Hyperactivity/Impulsivity:   N/A   Oppositional/Defiant Behaviors:   N/A   Emotional Irregularity:   N/A   Other Mood/Personality Symptoms:  No data recorded   Mental Status Exam Appearance and self-care  Stature:   Average   Weight:   Average weight   Clothing:   Disheveled   Grooming:   Normal   Cosmetic use:   None   Posture/gait:   Normal   Motor activity:   -- (Within normal range)   Sensorium  Attention:   Normal   Concentration:   Normal   Orientation:   X5   Recall/memory:   Normal   Affect and Mood  Affect:   Congruent; Full Range   Mood:   Anxious   Relating  Eye contact:   None   Facial expression:   Responsive; Sad   Attitude toward examiner:   Cooperative   Thought and Language  Speech flow:  Clear and Coherent   Thought content:   Appropriate to Mood and Circumstances   Preoccupation:   None   Hallucinations:   None   Organization:  No data recorded  [employer] of Knowledge:   Fair   Intelligence:   Average   Abstraction:    Functional   Judgement:   Fair   Reality Testing:   Adequate   Insight:   Present; Fair   Decision Making:   Impulsive   Social Functioning  Social Maturity:   Isolates; Self-centered   Social Judgement:   Chemical engineer"; Normal   Stress  Stressors:   Financial; Relationship; Transitions; Family conflict   Coping Ability:   Normal   Skill Deficits:   None   Supports:   Friends/Service system     Religion: Religion/Spirituality Are You A Religious Person?: No  Leisure/Recreation: Leisure / Recreation Do You Have Hobbies?: No  Exercise/Diet: Exercise/Diet Do You Exercise?: No Have You Gained or Lost A Significant Amount of Weight in the Past Six  Months?: No Do You Follow a Special Diet?: No Do You Have Any Trouble Sleeping?: Yes Explanation of Sleeping Difficulties: Due to work schedule   CCA Employment/Education Employment/Work Situation: Employment / Work Situation Employment Situation: Employed Patient's Job has Been Impacted by Current Illness: No Has Patient ever Been in Passenger transport manager?: No  Education: Education Is Patient Currently Attending School?: No Did You Have An Individualized Education Program (IIEP): No Did You Have Any Difficulty At [employer]?: No Patient's Education Has Been Impacted by Current Illness: No   CCA Family/Childhood History Family and Relationship History: Family history Marital status: Single Does patient have children?: Yes How many children?: 1  Childhood History:  Childhood History By whom was/is the patient raised?: Mother Did patient suffer any verbal/emotional/physical/sexual abuse as a child?: No Did patient suffer from severe childhood neglect?: No Has patient ever been sexually abused/assaulted/raped as an adolescent or adult?: No Was the patient ever a victim of a crime or a disaster?: No Witnessed domestic violence?: No Has patient been affected by domestic violence as an adult?:  No  Child/Adolescent Assessment:    CCA Substance Use Alcohol/Drug Use: Alcohol / Drug Use Pain Medications: See PTA Prescriptions: See PTA Over the Counter: See PTA History of alcohol / drug use?: Yes Longest period of sobriety (when/how long): Unable to quantify Negative Consequences of Use: Personal relationships, Legal Substance #1 Name of Substance 1: Alcohol 1 - Last Use / Amount: 12/28/2020 1- Route of Use: Oral   ASAM's:  Six Dimensions of Multidimensional Assessment  Dimension 1:  Acute Intoxication and/or Withdrawal Potential:      Dimension 2:  Biomedical Conditions and Complications:      Dimension 3:  Emotional, Behavioral, or Cognitive Conditions and Complications:     Dimension 4:  Readiness to Change:     Dimension 5:  Relapse, Continued use, or Continued Problem Potential:     Dimension 6:  Recovery/Living Environment:     ASAM Severity Score:    ASAM Recommended Level of Treatment:     Substance use Disorder (SUD)    Recommendations for Services/Supports/Treatments:    Discharge Disposition:    DSM5 Diagnoses: Patient Active Problem List   Diagnosis Date Noted   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   Major depressive disorder, recurrent severe without psychotic features ([place]) 07/12/2020   Alcohol use disorder, severe, dependence ([place]) 04/03/2020   Alcohol abuse 03/28/2020   Suicide attempt ([place]) 03/28/2020   ADD (attention deficit disorder) 10/21/2015   Dysthymia 10/21/2015     Referrals to Alternative Service(s): Referred to Alternative Service(s):   Place:   Date:   Time:    Referred to Alternative Service(s):   Place:   Date:   Time:    Referred to Alternative Service(s):   Place:   Date:   Time:    Referred to Alternative Service(s):   Place:   Date:   Time:     Gunnar Fusi MS, LCAS, [facility], [facility] Therapeutic Triage Specialist 12/28/2020 2:15 PM

## 2020-12-28 NOTE — ED Notes (Signed)
Pt given [place] sandwich tray and OJ at this time.

## 2020-12-28 NOTE — ED Notes (Signed)
Pt resting quietly in triage in recliner, respirations equal and unlabored, waiting for bed to see psychiatry team.

## 2020-12-28 NOTE — TOC Initial Note (Signed)
Transition of Care [facility]) - Initial/Assessment Note    Patient Details  Name: Joshua [name] MRN: [id number] Date of Birth: 26-May-1985  Transition of Care [facility] CM/SW Contact:    Marina Goodell Phone Number: [phone] 12/28/2020, 4:01 PM  Clinical Narrative:                  Patient will d/c home with Benedetto Goad via [place] at 4:15PM.  CSW updated EDP/ED Staff of transportation arrangements.       Patient Goals and CMS Choice        Expected Discharge Plan and Services           Expected Discharge Date: 12/28/20                                    Prior Living Arrangements/Services                       Activities of Daily Living      Permission Sought/Granted                  Emotional Assessment              Admission diagnosis:  Voluntary Patient Active Problem List   Diagnosis Date Noted   Alcohol-induced mood disorder (HCC) 12/28/2020   Major depressive disorder, recurrent severe without psychotic features (HCC) 07/12/2020   Alcohol use disorder, severe, dependence (HCC) 04/03/2020   Alcohol abuse 03/28/2020   Suicide attempt (HCC) 03/28/2020   ADD (attention deficit disorder) 10/21/2015   Dysthymia 10/21/2015   PCP:  [facility] Pharmacy:   [facility] [address] [place] [address] Phone: [phone] Fax: [phone]  [facility] [address] [place] [address] Phone: [phone] Fax: [phone]     Social Determinants of Health (SDOH) Interventions    Readmission Risk Interventions No flowsheet data found.

## 2020-12-28 NOTE — ED Notes (Signed)
Placed in bags.  Shirt Jeans Boxers Socks Coat Phone $16 in wallet

## 2020-12-28 NOTE — ED Notes (Signed)
Belongings bag 2/2 returned to patient at this time

## 2020-12-28 NOTE — ED Provider Notes (Signed)
[facility] Emergency Department Provider Note   ____________________________________________   Event Date/Time   First MD Initiated Contact with Patient 12/28/20 [phone]     (approximate)  I have reviewed the triage vital signs and the nursing notes.   HISTORY  Chief Complaint Psychiatric Evaluation    HPI Joshua [name] is a 35 y.o. male with a history of depression  Reports he has been having suicidal thoughts.  He reports his current medication regimen is not working and does not correlate well other assist him with dosing especially given he works night shifts.  Does use alcohol on the weekends only, reports last night he did drink alcohol prior to coming in but does not typically drink during the week  Denies history of withdrawals  Does not currently have a plan to harm himself but does report he has been thinking about committing suicide.  Denies any desire to harm himself at this moment in time.  No overdose or ingestion  Also has had a runny nose and a dry cough for several days now.  Denies shortness of breath no fever.  No known sick contacts Past Medical History:  Diagnosis Date   ADD (attention deficit disorder)    Depression    Paranoid (HCC)     Patient Active Problem List   Diagnosis Date Noted   Alcohol-induced mood disorder (HCC) 12/28/2020   Major depressive disorder, recurrent severe without psychotic features (HCC) 07/12/2020   Alcohol use disorder, severe, dependence (HCC) 04/03/2020   Alcohol abuse 03/28/2020   Suicide attempt (HCC) 03/28/2020   ADD (attention deficit disorder) 10/21/2015   Dysthymia 10/21/2015    Past Surgical History:  Procedure Laterality Date   HERNIA REPAIR      Prior to Admission medications   Medication Sig Start Date End Date Taking? Authorizing Provider  hydrOXYzine (ATARAX/VISTARIL) 25 MG tablet Take 1 tablet by mouth once every morning and take 2 tablets by mouth at bedtime. 11/25/20      ibuprofen (ADVIL) 200 MG tablet Take 200 mg by mouth every 6 (six) hours as needed for mild pain, moderate pain or headache.    [provider]  lisinopril (ZESTRIL) 5 MG tablet Take 1 tablet (5 mg total) by mouth once daily for high blood pressure. 07/18/20   Armandina Stammer I, NP  pantoprazole (PROTONIX) 40 MG tablet Take 1 tablet (40 mg total) by mouth once daily for acid reflux. 07/18/20   Armandina Stammer I, NP  risperiDONE (RISPERDAL) 0.5 MG tablet Take 1 tablet (0.5 mg total) by mouth 2 (two) times daily. 12/28/20   Charm Rings, NP  sertraline (ZOLOFT) 50 MG tablet Take 1 tablet by mouth once daily. 11/25/20     traZODone (DESYREL) 100 MG tablet Take 1 tablet (100 mg total) by mouth at bedtime as needed. For sleep 07/17/20 07/17/20  Sanjuana Kava, NP    Allergies Patient has no known allergies.  History reviewed. No pertinent family history.  Social History Social History   Tobacco Use   Smoking status: Every Day    Packs/day: 0.50    Types: Cigarettes   Smokeless tobacco: Never  Substance Use Topics   Alcohol use: Yes    Comment: occassional 24oz beer at night   Drug use: No    Review of Systems Constitutional: No fever/chills Eyes: No visual changes. ENT: No sore throat.  Runny nose. Cardiovascular: Denies chest pain. Respiratory: Denies shortness of breath.  Dry cough for several days. Gastrointestinal: No  abdominal pain.   Skin: Negative for rash. Neurological: Negative for headaches, areas of focal weakness or numbness. Psychiatric: Denies overdose or ingestion.  Positive for suicidal ideations   ____________________________________________   PHYSICAL EXAM:  VITAL SIGNS: ED Triage Vitals  Enc Vitals Group     BP 12/27/20 2255 125/80     Pulse Rate 12/27/20 2255 98     Resp 12/27/20 2255 18     Temp 12/27/20 2255 98.5 F (36.9 C)     Temp Source 12/27/20 2255 Oral     SpO2 12/27/20 2255 94 %     Weight 12/27/20 2256 156 lb 8.4 oz (71 kg)     Height  12/27/20 2256 5\' 4"  (1.626 m)     Head Circumference --      Peak Flow --      Pain Score 12/27/20 2256 0     Pain Loc --      Pain Edu? --      Excl. in GC? --     Constitutional: Alert and oriented. Well appearing and in no acute distress.  Resting in recliner without difficulty. Eyes: Conjunctivae are normal. Head: Atraumatic. Nose: Mild clear coryza. Mouth/Throat: Mucous membranes are moist. Neck: No stridor.  Cardiovascular: Normal rate, regular rhythm. Grossly normal heart sounds.  Good peripheral circulation. Respiratory: Normal respiratory effort.  No retractions. Lungs CTAB.  Occasional dry cough. Gastrointestinal: Soft and nontender. No distention. Musculoskeletal: No lower extremity tenderness nor edema. Neurologic:  Normal speech and language. No gross focal neurologic deficits are appreciated.  Skin:  Skin is warm, dry and intact. No rash noted. Psychiatric: Mood and affect are normal. Speech and behavior are normal.  ____________________________________________   LABS (all labs ordered are listed, but only abnormal results are displayed)  Labs Reviewed  COMPREHENSIVE METABOLIC PANEL - Abnormal; Notable for the following components:      Result Value   Glucose, Bld 104 (*)    All other components within normal limits  ETHANOL - Abnormal; Notable for the following components:   Alcohol, Ethyl (B) 222 (*)    All other components within normal limits  SALICYLATE LEVEL - Abnormal; Notable for the following components:   Salicylate Lvl <7.0 (*)    All other components within normal limits  ACETAMINOPHEN LEVEL - Abnormal; Notable for the following components:   Acetaminophen (Tylenol), Serum <10 (*)    All other components within normal limits  RESP PANEL BY RT-PCR (FLU A&B, COVID) ARPGX2  CBC  URINE DRUG SCREEN, QUALITATIVE ([facility] ONLY)   ____________________________________________  EKG   ____________________________________________  RADIOLOGY  DG Chest  2 View  Result Date: 12/28/2020 CLINICAL DATA:  Persistent cough for several days. EXAM: CHEST - 2 VIEW COMPARISON:  None. FINDINGS: Prominent right convexity of the heart which is likely due to positioning based on abdominal CT from earlier this year which partially covers the mediastinum. 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 There is no edema, consolidation, effusion, or pneumothorax. IMPRESSION: 1. No acute finding. 2. Right aortic arch. Electronically Signed   By: 12/30/2020 M.D.   On: 12/28/2020 08:46     No acute findings noted on chest x-ray ____________________________________________   PROCEDURES  Procedure(s) performed: None  Procedures  Critical Care performed: No  ____________________________________________   INITIAL IMPRESSION / ASSESSMENT AND PLAN / ED COURSE  Pertinent labs & imaging results that were available during my care of the patient were reviewed by me and considered in my medical decision making (see chart  for details).   Patient resting without distress.  Does have some coryza, dry nonproductive cough but clear lung sounds and no increased work of breathing.  Afebrile.  We will check for COVID, influenza, and also obtain chest x-ray to exclude community-acquired pneumonia given his symptomatology.  Overall though clinical history seems to most be suggestive of likely upper respiratory viral illness.  Patient presents reports suicidal ideation.  Placed under IVC as he does report active ideation but no plan to harm himself.  He been previously evaluated and cared for by psychiatry services here.  He is amenable to staying and being seen, but also tells me that he needs to go to work this evening and I do think that he has potential concern for elopement risk, and weighing risks and benefits I have petition for IVC given his report of active suicidal ideations.    Charge RN, Marylene Land, made aware of decision to place under IVC at 0805   Medical screening labs  reviewed reassuring.   Flu and COVID-negative  ----------------------------------------- 2:38 PM on 12/28/2020 ----------------------------------------- Psychiatry, Nanine Means, advises patient to be discharged.  IVC rescinded.  Will discharge patient to home, medication changes as per psychiatry.  Also cough upper respiratory symptoms consistent with likely viral upper respiratory syndrome.  Return precautions and treatment recommendations and follow-up discussed with the patient who is agreeable with the plan.  Patient understanding with plan, return precautions, and he is not driving himself home and instructed not to utilize car or drive a car while under the influence of alcohol      ____________________________________________   FINAL CLINICAL IMPRESSION(S) / ED DIAGNOSES  Final diagnoses:  Upper respiratory tract infection, unspecified type  Alcohol-induced mood disorder (HCC)        Note:  This document was prepared using Dragon voice recognition software and may include unintentional dictation errors       Sharyn Creamer, MD 12/28/20 1442

## 2020-12-28 NOTE — Consult Note (Addendum)
[facility] Face-to-Face Psychiatry Consult   Reason for Consult:  alcohol intoxication with suicidal ideations Referring Physician:  EDP Patient Identification: Joshua [name] MRN:  [id number] Principal Diagnosis: Alcohol-induced mood disorder (HCC) Diagnosis:  Principal Problem:   Alcohol-induced mood disorder (HCC)   Total Time spent with patient: 1 hour  Subjective:   Joshua [name] is a 35 y.o. male patient admitted with alcohol intoxication and suicidal ideations on admission.  "I'm good.  I need to call an [employer] and get to work by 11:30 pm.".  HPI:  35 yo male with history of depression, anxiety, and alcohol abuse.  On assessment, he is clear and coherent.  He is bothered by his daughter being allowed to move to [place] and no ability to get there to see her.  He is currently living with a friend and moving to [place], [place] in January.  Denies drinking alcohol regularly and drank last time for the first time in awhile.  No withdrawal symptoms or past complications from alcohol cessation.  Mild depression with complaints of mood swings and does not feel his Depakote is working, stopped taking all his medications a few weeks ago.  He does report the Zoloft was working and willing to try another medication for his anger and mood swings.  Denies suicidal/homicidal ideations, hallucinations, and other substance use.  He requests to start medications and leave to get to work at 11:30 tonight at [employer].  Agreeable to follow up with [facility] where he receives care.  Medications started, evaluate for side effects and will discharge to outpatient services, psychiatrically stable for discharge.  Past Psychiatric History: depression, ADD, alcohol abuse  Risk to [name]:  none Risk to Others:  none Prior Inpatient Therapy:  a few times Prior Outpatient Therapy:  [facility]  Past Medical History:  Past Medical History:  Diagnosis Date   ADD (attention deficit disorder)    Depression    Paranoid (HCC)      Past Surgical History:  Procedure Laterality Date   HERNIA REPAIR     Family History: History reviewed. No pertinent family history. Family Psychiatric  History: none Social History:  Social History   Substance and Sexual Activity  Alcohol Use Yes   Comment: occassional 24oz beer at night     Social History   Substance and Sexual Activity  Drug Use No    Social History   Socioeconomic History   Marital status: Single    Spouse name: Not on file   Number of children: Not on file   Years of education: Not on file   Highest education level: Not on file  Occupational History   Not on file  Tobacco Use   Smoking status: Every Day    Packs/day: 0.50    Types: Cigarettes   Smokeless tobacco: Never  Substance and Sexual Activity   Alcohol use: Yes    Comment: occassional 24oz beer at night   Drug use: No   Sexual activity: Not on file  Other Topics Concern   Not on file  Social History Narrative   Not on file   Social Determinants of Health   Financial Resource Strain: Not on file  Food Insecurity: Not on file  Transportation Needs: Not on file  Physical Activity: Not on file  Stress: Not on file  Social Connections: Not on file   Additional Social History:    Allergies:  No Known Allergies  Labs:  Results for orders placed or performed during the hospital encounter of 12/28/20 (  from the past 48 hour(s))  Comprehensive metabolic panel     Status: Abnormal   Collection Time: 12/27/20 11:00 PM  Result Value Ref Range   Sodium 138 135 - 145 mmol/L   Potassium 3.8 3.5 - 5.1 mmol/L   Chloride 107 98 - 111 mmol/L   CO2 25 22 - 32 mmol/L   Glucose, Bld 104 (H) 70 - 99 mg/dL    Comment: Glucose reference range applies only to samples taken after fasting for at least 8 hours.   BUN 10 6 - 20 mg/dL   Creatinine, Ser 8.93 0.61 - 1.24 mg/dL   Calcium 8.9 8.9 - 73.4 mg/dL   Total Protein 6.8 6.5 - 8.1 g/dL   Albumin 4.1 3.5 - 5.0 g/dL   AST 15 15 - 41 U/L   ALT  18 0 - 44 U/L   Alkaline Phosphatase 53 38 - 126 U/L   Total Bilirubin 0.6 0.3 - 1.2 mg/dL   GFR, Estimated >28 >76 mL/min    Comment: (NOTE) Calculated using the CKD-EPI Creatinine Equation (2021)    Anion gap 6 5 - 15    Comment: Performed at [facility], [address] [place], [place] [address]  Ethanol     Status: Abnormal   Collection Time: 12/27/20 11:00 PM  Result Value Ref Range   Alcohol, Ethyl (B) 222 (H) <10 mg/dL    Comment: (NOTE) Lowest detectable limit for serum alcohol is 10 mg/dL.  For medical purposes only. Performed at [facility], [address] [place], [place] [address]   Salicylate level     Status: Abnormal   Collection Time: 12/27/20 11:00 PM  Result Value Ref Range   Salicylate Lvl <7.0 (L) 7.0 - 30.0 mg/dL    Comment: Performed at [facility], [address] [place], [place] [address]  Acetaminophen level     Status: Abnormal   Collection Time: 12/27/20 11:00 PM  Result Value Ref Range   Acetaminophen (Tylenol), Serum <10 (L) 10 - 30 ug/mL    Comment: (NOTE) Therapeutic concentrations vary significantly. A range of 10-30 ug/mL  may be an effective concentration for many patients. However, some  are best treated at concentrations outside of this range. Acetaminophen concentrations >150 ug/mL at 4 hours after ingestion  and >50 ug/mL at 12 hours after ingestion are often associated with  toxic reactions.  Performed at [facility], [address] [place], [place] [address]   cbc     Status: None   Collection Time: 12/27/20 11:00 PM  Result Value Ref Range   WBC 8.8 4.0 - 10.5 K/uL   RBC 5.43 4.22 - 5.81 MIL/uL   Hemoglobin 16.8 13.0 - 17.0 g/dL   HCT 53.6 46.8 - 03.2 %   MCV 92.1 80.0 - 100.0 fL   MCH 30.9 26.0 - 34.0 pg   MCHC 33.6 30.0 - 36.0 g/dL   RDW 12.2 48.2 - 50.0 %   Platelets 227 150 - 400 K/uL   nRBC 0.0 0.0 - 0.2 %    Comment: Performed at [facility], [address], [place], [place] [address]  Resp Panel by RT-PCR (Flu A&B, Covid) Nasopharyngeal Swab     Status: None   Collection Time: 12/28/20  9:48 AM   Specimen: Nasopharyngeal Swab; Nasopharyngeal(NP) swabs in vial transport medium  Result Value Ref Range   SARS Coronavirus 2 by RT PCR NEGATIVE NEGATIVE    Comment: (NOTE) SARS-CoV-2 target nucleic acids are  NOT DETECTED.  The SARS-CoV-2 RNA is generally detectable in upper respiratory specimens during the acute phase of infection. The lowest concentration of SARS-CoV-2 viral copies this assay can detect is 138 copies/mL. A negative result does not preclude SARS-Cov-2 infection and should not be used as the sole basis for treatment or other patient management decisions. A negative result may occur with  improper specimen collection/handling, submission of specimen other than nasopharyngeal swab, presence of viral mutation(s) within the areas targeted by this assay, and inadequate number of viral copies(<138 copies/mL). A negative result must be combined with clinical observations, patient history, and epidemiological information. The expected result is Negative.  Fact Sheet for Patients:  BloggerCourse.com  Fact Sheet for Healthcare Providers:  SeriousBroker.it  This test is no t yet approved or cleared by the [place] FDA and  has been authorized for detection and/or diagnosis of SARS-CoV-2 by FDA under an Emergency Use Authorization (EUA). This EUA will remain  in effect (meaning this test can be used) for the duration of the COVID-19 declaration under Section 564(b)(1) of the Act, 21 U.S.C.section 360bbb-3(b)(1), unless the authorization is terminated  or revoked sooner.       Influenza A by PCR NEGATIVE NEGATIVE   Influenza B by PCR NEGATIVE NEGATIVE    Comment: (NOTE) The Xpert Xpress SARS-CoV-2/FLU/RSV plus assay is intended as an aid in the diagnosis of influenza from  Nasopharyngeal swab specimens and should not be used as a sole basis for treatment. Nasal washings and aspirates are unacceptable for Xpert Xpress SARS-CoV-2/FLU/RSV testing.  Fact Sheet for Patients: BloggerCourse.com  Fact Sheet for Healthcare Providers: SeriousBroker.it  This test is not yet approved or cleared by the [place] FDA and has been authorized for detection and/or diagnosis of SARS-CoV-2 by FDA under an Emergency Use Authorization (EUA). This EUA will remain in effect (meaning this test can be used) for the duration of the COVID-19 declaration under Section 564(b)(1) of the Act, 21 U.S.C. section 360bbb-3(b)(1), unless the authorization is terminated or revoked.  Performed at [facility], [address] [place], [place] [address]     No current facility-administered medications for this encounter.   Current Outpatient Medications  Medication Sig Dispense Refill   divalproex (DEPAKOTE) 250 MG DR tablet Take 1 tablet by mouth once every morning and take 2 tablets at bedtime. 90 tablet 1   hydrOXYzine (ATARAX/VISTARIL) 25 MG tablet Take 1 tablet by mouth once every morning and take 2 tablets by mouth at bedtime. 90 tablet 1   ibuprofen (ADVIL) 200 MG tablet Take 200 mg by mouth every 6 (six) hours as needed for mild pain, moderate pain or headache.     lisinopril (ZESTRIL) 5 MG tablet Take 1 tablet (5 mg total) by mouth once daily for high blood pressure. 30 tablet 0   pantoprazole (PROTONIX) 40 MG tablet Take 1 tablet (40 mg total) by mouth once daily for acid reflux. 30 tablet 0   sertraline (ZOLOFT) 50 MG tablet Take 1 tablet by mouth once daily. 30 tablet 1    Musculoskeletal: Strength & Muscle Tone: within normal limits Gait & Station: normal Patient leans: N/A Psychiatric Specialty Exam: Physical Exam Vitals and nursing note reviewed.  Constitutional:      Appearance: Normal appearance.   HENT:     Head: Normocephalic.     Nose: Nose normal.  Pulmonary:     Effort: Pulmonary effort is normal.  Musculoskeletal:        General: Normal range  of motion.     Cervical back: Normal range of motion.  Neurological:     General: No focal deficit present.     Mental Status: He is alert and oriented to person, place, and time.  Psychiatric:        Attention and Perception: Attention and perception normal.        Mood and Affect: Mood is anxious.        Speech: Speech normal.        Behavior: Behavior normal. Behavior is cooperative.        Thought Content: Thought content normal.        Cognition and Memory: Cognition and memory normal.        Judgment: Judgment normal.    Review of Systems  Neurological:  Positive for headaches.  Psychiatric/Behavioral:  Positive for dysphoric mood and substance abuse. The patient is nervous/anxious.   All other systems reviewed and are negative.  Blood pressure (!) 102/51, pulse 72, temperature 98.5 F (36.9 C), temperature source Oral, resp. rate 16, height  (1.626 m), weight 71 kg, SpO2 96 %.Body mass index is 26.87 kg/m.  General Appearance: Casual  Eye Contact:  Good  Speech:  Normal Rate  Volume:  Normal  Mood:  Anxious and Depressed  Affect:  Congruent  Thought Process:  Coherent and Descriptions of Associations: Intact  Orientation:  Full (Time, Place, and Person)  Thought Content:  WDL and Logical  Suicidal Thoughts:  No  Homicidal Thoughts:  No  Memory:  Immediate;   Good Recent;   Good Remote;   Good  Judgement:  Fair  Insight:  Fair  Psychomotor Activity:  Normal  Concentration:  Concentration: Good and Attention Span: Good  Recall:  Good  Fund of Knowledge:  Good  Language:  Good  Akathisia:  No  Handed:  Right  AIMS (if indicated):     Assets:  Housing Leisure Time Physical Health Resilience Social Support  ADL's:  Intact  Cognition:  WNL  Sleep:      Theme park manager  Physical Exam: Physical Exam Vitals  and nursing note reviewed.  Constitutional:      Appearance: Normal appearance.  HENT:     Head: Normocephalic.     Nose: Nose normal.  Pulmonary:     Effort: Pulmonary effort is normal.  Musculoskeletal:        General: Normal range of motion.     Cervical back: Normal range of motion.  Neurological:     General: No focal deficit present.     Mental Status: He is alert and oriented to person, place, and time.  Psychiatric:        Attention and Perception: Attention and perception normal.        Mood and Affect: Mood is anxious.        Speech: Speech normal.        Behavior: Behavior normal. Behavior is cooperative.        Thought Content: Thought content normal.        Cognition and Memory: Cognition and memory normal.        Judgment: Judgment normal.   Review of Systems  Neurological:  Positive for headaches.  Psychiatric/Behavioral:  Positive for dysphoric mood and substance abuse. The patient is nervous/anxious.   All other systems reviewed and are negative. Blood pressure (!) 102/51, pulse 72, temperature 98.5 F (36.9 C), temperature source Oral, resp. rate 16, height  (1.626 m), weight 71 kg, SpO2 96 %.  Body mass index is 26.87 kg/m.  Treatment Plan Summary: Alcohol induced mood disorder: -Started Risperdal 0.5 mg BID -Restarted Zoloft 25 mg daily -Follow up with [facility]  Disposition: No evidence of imminent risk to [name] or others at present.   Patient does not meet criteria for psychiatric inpatient admission. Supportive therapy provided about ongoing stressors.  Nanine Means, NP 12/28/2020 12:43 PM

## 2020-12-28 NOTE — ED Notes (Signed)
Patient witnessed by this RN getting into transportation vehicle that was arranged by social work team. Patient signed rider waver and placed in chart.

## 2020-12-28 NOTE — Discharge Instructions (Addendum)

## 2020-12-29 ENCOUNTER — Other Ambulatory Visit: Payer: Self-pay

## 2020-12-30 ENCOUNTER — Other Ambulatory Visit: Payer: Self-pay

## 2020-12-31 ENCOUNTER — Other Ambulatory Visit: Payer: Self-pay

## 2021-01-06 ENCOUNTER — Other Ambulatory Visit: Payer: Self-pay

## 2021-01-06 ENCOUNTER — Emergency Department
Admission: EM | Admit: 2021-01-06 | Discharge: 2021-01-07 | Disposition: A | Discharge status: Home / Self Care | Payer: Federal, State, Local not specified - Other | Attending: Emergency Medicine | Admitting: Emergency Medicine

## 2021-01-06 ENCOUNTER — Encounter: Payer: Self-pay | Admitting: Emergency Medicine

## 2021-01-06 DIAGNOSIS — Z20822 Contact with and (suspected) exposure to covid-19: Secondary | ICD-10-CM | POA: Insufficient documentation

## 2021-01-06 DIAGNOSIS — F909 Attention-deficit hyperactivity disorder, unspecified type: Secondary | ICD-10-CM | POA: Insufficient documentation

## 2021-01-06 DIAGNOSIS — T1491XA Suicide attempt, initial encounter: Secondary | ICD-10-CM | POA: Insufficient documentation

## 2021-01-06 DIAGNOSIS — F1094 Alcohol use, unspecified with alcohol-induced mood disorder: Secondary | ICD-10-CM | POA: Diagnosis present

## 2021-01-06 DIAGNOSIS — F17210 Nicotine dependence, cigarettes, uncomplicated: Secondary | ICD-10-CM | POA: Insufficient documentation

## 2021-01-06 DIAGNOSIS — F1994 Other psychoactive substance use, unspecified with psychoactive substance-induced mood disorder: Secondary | ICD-10-CM

## 2021-01-06 DIAGNOSIS — F1020 Alcohol dependence, uncomplicated: Secondary | ICD-10-CM | POA: Diagnosis present

## 2021-01-06 DIAGNOSIS — R718 Other abnormality of red blood cells: Secondary | ICD-10-CM | POA: Insufficient documentation

## 2021-01-06 DIAGNOSIS — F1010 Alcohol abuse, uncomplicated: Secondary | ICD-10-CM | POA: Diagnosis present

## 2021-01-06 DIAGNOSIS — Z79899 Other long term (current) drug therapy: Secondary | ICD-10-CM | POA: Insufficient documentation

## 2021-01-06 DIAGNOSIS — F988 Other specified behavioral and emotional disorders with onset usually occurring in childhood and adolescence: Secondary | ICD-10-CM | POA: Diagnosis present

## 2021-01-06 DIAGNOSIS — Y906 Blood alcohol level of 120-199 mg/100 ml: Secondary | ICD-10-CM | POA: Insufficient documentation

## 2021-01-06 DIAGNOSIS — F10229 Alcohol dependence with intoxication, unspecified: Secondary | ICD-10-CM | POA: Insufficient documentation

## 2021-01-06 DIAGNOSIS — F341 Dysthymic disorder: Secondary | ICD-10-CM | POA: Insufficient documentation

## 2021-01-06 DIAGNOSIS — F1024 Alcohol dependence with alcohol-induced mood disorder: Secondary | ICD-10-CM | POA: Insufficient documentation

## 2021-01-06 DIAGNOSIS — X838XXA Intentional self-harm by other specified means, initial encounter: Secondary | ICD-10-CM | POA: Insufficient documentation

## 2021-01-06 DIAGNOSIS — F600 Paranoid personality disorder: Secondary | ICD-10-CM | POA: Insufficient documentation

## 2021-01-06 DIAGNOSIS — F332 Major depressive disorder, recurrent severe without psychotic features: Secondary | ICD-10-CM | POA: Insufficient documentation

## 2021-01-06 LAB — CBC
HCT: 54.2 % — ABNORMAL HIGH (ref 39.0–52.0)
Hemoglobin: 18.1 g/dL — ABNORMAL HIGH (ref 13.0–17.0)
MCH: 30.2 pg (ref 26.0–34.0)
MCHC: 33.4 g/dL (ref 30.0–36.0)
MCV: 90.3 fL (ref 80.0–100.0)
Platelets: 305 10*3/uL (ref 150–400)
RBC: 6 MIL/uL — ABNORMAL HIGH (ref 4.22–5.81)
RDW: 12.7 % (ref 11.5–15.5)
WBC: 10.5 10*3/uL (ref 4.0–10.5)
nRBC: 0 % (ref 0.0–0.2)

## 2021-01-06 LAB — COMPREHENSIVE METABOLIC PANEL
ALT: 21 U/L (ref 0–44)
AST: 16 U/L (ref 15–41)
Albumin: 3.8 g/dL (ref 3.5–5.0)
Alkaline Phosphatase: 42 U/L (ref 38–126)
Anion gap: 15 (ref 5–15)
BUN: 10 mg/dL (ref 6–20)
CO2: 20 mmol/L — ABNORMAL LOW (ref 22–32)
Calcium: 9.3 mg/dL (ref 8.9–10.3)
Chloride: 101 mmol/L (ref 98–111)
Creatinine, Ser: 0.87 mg/dL (ref 0.61–1.24)
GFR, Estimated: >60 mL/min (ref >60)
Glucose, Bld: 96 mg/dL (ref 70–99)
Potassium: 3.7 mmol/L (ref 3.5–5.1)
Sodium: 136 mmol/L (ref 135–145)
Total Bilirubin: 0.8 mg/dL (ref 0.3–1.2)
Total Protein: 6.5 g/dL (ref 6.5–8.1)

## 2021-01-06 LAB — ETHANOL: Alcohol, Ethyl (B): 121 mg/dL — ABNORMAL HIGH (ref <10)

## 2021-01-06 LAB — RESP PANEL BY RT-PCR (FLU A&B, COVID) ARPGX2
Influenza A by PCR: NEGATIVE
Influenza B by PCR: NEGATIVE
SARS Coronavirus 2 by RT PCR: NEGATIVE

## 2021-01-06 LAB — ACETAMINOPHEN LEVEL: Acetaminophen (Tylenol), Serum: <10 ug/mL — ABNORMAL LOW (ref 10–30)

## 2021-01-06 LAB — SALICYLATE LEVEL: Salicylate Lvl: <7 mg/dL — ABNORMAL LOW (ref 7.0–30.0)

## 2021-01-06 NOTE — ED Triage Notes (Signed)
Pt arrived via [facility], pt admits to drinking alcohol, on arrival to triage room pt is verbally aggressive. Pt repeatedly stating in triage "just shoot me" Pt reports he was thinking about walking to the store to get more beer then stated "maybe I will walk out into traffic"  Pt appears intoxicated. Pt has several layers of clothing on.  Pt cooperative with blood draw.

## 2021-01-06 NOTE — ED Notes (Addendum)
Pt belongings:  Manson Passey coat, reflective vest, cell phone, cigarettes, helmet, tank pans, green shirt, glasses, brown boats, black zip up suit, white and black socks, blue boxers.   Dressed out by this Charity fundraiser, EDT [employer] and Youth worker.  Pt has 3 pt belongings bags and 1 motor cycle helmet not in a bag with pt label

## 2021-01-06 NOTE — ED Provider Notes (Signed)
[facility] Emergency Department Provider Note  ____________________________________________   Event Date/Time   First MD Initiated Contact with Patient 01/06/21 2304     (approximate)  I have reviewed the triage vital signs and the nursing notes.   HISTORY  Chief Complaint Psychiatric Evaluation  Level 5 caveat:  history/ROS limited by active psychosis / mental illness / altered mental status   HPI Joshua [name] is a 35 y.o. male known to the emergency department from prior visits due to alcohol induced mood disorder and other behavioral issues.  He comes in tonight accompanied by law enforcement but not yet under involuntary commitment.  He will only provide a limited history but is extremely verbally aggressive, threatening to kill various people, labile emotions, and also vaguely threatening to kill himself.  He expressed to the triage nurse his thoughts about walking into traffic.  He is requesting to the law enforcement officers around that they should "just shoot me".  He will not provide much history to me but states that he is tired of everything.     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 Authorizing Provider  hydrOXYzine (ATARAX/VISTARIL) 25 MG tablet Take 1 tablet by mouth once every morning and take 2 tablets by mouth at bedtime.  11/25/20     ibuprofen (ADVIL) 200 MG tablet Take 200 mg by mouth every 6 (six) hours as needed for mild pain, moderate pain or headache.    [provider]  lisinopril (ZESTRIL) 5 MG tablet Take 1 tablet (5 mg total) by mouth once daily for high blood pressure. 07/18/20   Lindell Spar I, NP  pantoprazole (PROTONIX) 40 MG tablet Take 1 tablet (40 mg total) by mouth once daily for acid reflux. 07/18/20   Lindell Spar I, NP  risperiDONE (RISPERDAL) 0.5 MG tablet Take 1 tablet (0.5 mg total) by mouth 2 (two) times daily. 12/28/20   Patrecia Pour, NP  sertraline (ZOLOFT) 50 MG tablet Take 1 tablet by mouth once daily. 11/25/20     traZODone (DESYREL) 100 MG tablet Take 1 tablet (100 mg total) by mouth at bedtime as needed. For sleep 07/17/20 07/17/20  Encarnacion Slates, NP    Allergies Patient has no known allergies.  History reviewed. No pertinent family history.  Social History Social History   Tobacco Use   Smoking status: Every Day    Packs/day: 0.50    Types: Cigarettes   Smokeless tobacco: Never  Substance Use Topics   Alcohol use: Yes    Comment: occassional 24oz beer at night   Drug use: No    Review of Systems Level 5 caveat:  history/ROS limited by active psychosis / mental illness / altered mental status  ____________________________________________   PHYSICAL EXAM:  VITAL SIGNS: ED Triage Vitals  Enc Vitals Group     BP 01/06/21 2243 136/88  Pulse Rate 01/06/21 2243 98     Resp 01/06/21 2243 20     Temp 01/06/21 2243 98.6 F (37 C)     Temp Source 01/06/21 2243 Oral     SpO2 01/06/21 2243 95 %     Weight 01/06/21 2247 71 kg (156 lb 8.4 oz)     Height 01/06/21 2247 1.626 m (5\' 4" )     Head Circumference --      Peak Flow --      Pain Score 01/06/21 2247 0     Pain Loc --      Pain Edu? --      Excl. in [place]? --     Constitutional: Awake and alert. Eyes: Conjunctivae are normal.  Head: Atraumatic. Nose: No congestion/rhinnorhea. Mouth/Throat: Patient  is wearing a mask. Neck: No stridor.  No meningeal signs.   Cardiovascular: Normal rate, regular rhythm.  Respiratory: Normal respiratory effort.  No retractions. Gastrointestinal: Soft and nondistended. Musculoskeletal: No lower extremity tenderness nor edema. No gross deformities of extremities. Neurologic:  Normal speech and language. No gross focal neurologic deficits are appreciated.  Skin:  Skin is warm, dry and intact. Psychiatric: Mood and affect are labile and aggressive.  Appears intoxicated.  Not providing consistent history but is consistently aggressive and verbally abusive and threatening to kill people including himself.  ____________________________________________   LABS (all labs ordered are listed, but only abnormal results are displayed)  Labs Reviewed  COMPREHENSIVE METABOLIC PANEL - Abnormal; Notable for the following components:      Result Value   CO2 20 (*)    All other components within normal limits  ETHANOL - Abnormal; Notable for the following components:   Alcohol, Ethyl (B) 121 (*)    All other components within normal limits  SALICYLATE LEVEL - Abnormal; Notable for the following components:   Salicylate Lvl [id number] (*)    All other components within normal limits  ACETAMINOPHEN LEVEL - Abnormal; Notable for the following components:   Acetaminophen (Tylenol), Serum <10 (*)    All other components within normal limits  CBC - Abnormal; Notable for the following components:   RBC 6.00 (*)    Hemoglobin 18.1 (*)    HCT 54.2 (*)    All other components within normal limits  RESP PANEL BY RT-PCR (FLU A&B, COVID) [id number]  URINE DRUG SCREEN, QUALITATIVE ([facility] ONLY)   ____________________________________________   PROCEDURES   Procedure(s) performed (including Critical Care):  Procedures   ____________________________________________   INITIAL IMPRESSION / MDM / [place] / ED COURSE  As part of my medical decision making, I reviewed  the following data within the [place] notes reviewed and incorporated, Labs reviewed , Old chart reviewed, A consult was requested and obtained from this/these consultant(s) Psychiatry, and Notes from prior ED visits   Differential diagnosis includes, but is not limited to, alcohol induced mood disorder, depression, personality disorder.  Patient appears intoxicated, admits to drinking, is verbally aggressive, and threatening to harm himself or others.  He has a pattern of doing this but at this point he does not have the capacity make his own decisions and I believe he currently represents a danger to himself and others.  I am placing him under involuntary commitment.  I suggested he lie down and rest in a safe place tonight.  He moderate obscenities back to me but did redirect himself to the bed and lie down.  I am consulting psychiatry.  He appears  medically stable.  Vital signs are within normal limits.  Alcohol level mildly elevated at 121.  Comprehensive metabolic panel is within normal limits.  CBC is normal other than some mild hemoconcentration with a hemoglobin of 18.1.  Salicylate and acetaminophen levels are still pending but I have no cause to believe that we will be elevated at this time.  The patient has been placed in psychiatric observation due to the need to provide a safe environment for the patient while obtaining psychiatric consultation and evaluation, as well as ongoing medical and medication management to treat the patient's condition.  The patient has been placed under full IVC at this time.        Clinical Course as of 01/07/21 0457  Wed Jan 07, 2021  0100 Discussed case in person with Annice Pih from psychiatry.  The plan is to reassess the patient in the morning to determine appropriate psychiatric disposition [CF]    Clinical Course User Index [CF] Loleta Rose, MD     ____________________________________________  FINAL CLINICAL  IMPRESSION(S) / ED DIAGNOSES  Final diagnoses:  None     MEDICATIONS GIVEN DURING THIS VISIT:  Medications - No data to display   ED Discharge Orders     None        Note:  This document was prepared using Dragon voice recognition software and may include unintentional dictation errors.   Loleta Rose, MD 01/07/21 [phone]

## 2021-01-07 DIAGNOSIS — F1010 Alcohol abuse, uncomplicated: Secondary | ICD-10-CM

## 2021-01-07 LAB — URINE DRUG SCREEN, QUALITATIVE (ARMC ONLY)
Amphetamines, Ur Screen: NOT DETECTED
Barbiturates, Ur Screen: NOT DETECTED
Benzodiazepine, Ur Scrn: NOT DETECTED
Cannabinoid 50 Ng, Ur ~~LOC~~: NOT DETECTED
Cocaine Metabolite,Ur ~~LOC~~: NOT DETECTED
MDMA (Ecstasy)Ur Screen: NOT DETECTED
Methadone Scn, Ur: NOT DETECTED
Opiate, Ur Screen: NOT DETECTED
Phencyclidine (PCP) Ur S: NOT DETECTED
Tricyclic, Ur Screen: NOT DETECTED

## 2021-01-07 NOTE — Consult Note (Signed)
[facility] Face-to-Face Psychiatry Consult   Reason for Consult: Psychiatric Evaluation Referring Physician: Dr. York Cerise Patient Identification: Joshua [name] MRN:  [id number] Principal Diagnosis: <principal problem not specified> Diagnosis:  Active Problems:   ADD (attention deficit disorder)   Dysthymia   Alcohol abuse   Suicide attempt (HCC)   Alcohol use disorder, severe, dependence (HCC)   Major depressive disorder, recurrent severe without psychotic features (HCC)   Alcohol-induced mood disorder (HCC)   Total Time spent with patient: 1 hour  Subjective: "I am ready to go home. I don't want to talk about it." Joshua [name] is a 35 y.o. male patient [facility] ED via law enforcement under involuntary commitment status (IVC). The patient expressed, "I want to kill myself." The patient is verbally aggressive towards staff, threatening to leave the ED. The patient stated that his roommate to him he has to be out of the home in two weeks. He acknowledged drinking alcohol today. He was on his way to get more, but while walking, he thought about walking into traffic. His BAL is 121 mg/dl, and UDS is waiting for the patient to offer urine.  The patient was seen face-to-face by this provider; the chart was reviewed and consulted with Dr. York Cerise on 01/07/2021 due to the patient's care. It was discussed with the EDP that the patient remained under observation overnight and will be reassessed in the a.m. to determine if he meets the criteria for psychiatric inpatient admission; he could be discharged home.  On evaluation, the patient is alert and oriented x 4, irritable, intoxicated but cooperative, and mood-congruent with affect. The patient does appear to be responding to internal and external stimuli. Neither is the patient presenting with any delusional thinking. The patient denies auditory or visual hallucinations. The patient admits to suicidal, homicidal ideations. The patient is not presenting with any  psychotic or paranoid behaviors. During an encounter with the patient, he could answer questions somewhat appropriately.   HPI: Per Dr. York Cerise, Joshua [name] is a 35 y.o. male known to the emergency department from prior visits due to alcohol induced mood disorder and other behavioral issues.  He comes in tonight accompanied by law enforcement but not yet under involuntary commitment.  He will only provide a limited history but is extremely verbally aggressive, threatening to kill various people, labile emotions, and also vaguely threatening to kill himself.  He expressed to the triage nurse his thoughts about walking into traffic.  He is requesting to the law enforcement officers around that they should "just shoot me".   He will not provide much history to me but states that he is tired of everything.    Past Psychiatric History:  ADD (attention deficit disorder)    Depression   Paranoid (HCC)   Risk to Self:   Risk to Others:   Prior Inpatient Therapy:   Prior Outpatient Therapy:    Past Medical History:  Past Medical History:  Diagnosis Date   ADD (attention deficit disorder)    Depression    Paranoid (HCC)     Past Surgical History:  Procedure Laterality Date   HERNIA REPAIR     Family History: History reviewed. No pertinent family history. Family Psychiatric  History:  Social History:  Social History   Substance and Sexual Activity  Alcohol Use Yes   Comment: occassional 24oz beer at night     Social History   Substance and Sexual Activity  Drug Use No    Social History   Socioeconomic History  Marital status: Single    Spouse name: Not on file   Number of children: Not on file   Years of education: Not on file   Highest education level: Not on file  Occupational History   Not on file  Tobacco Use   Smoking status: Every Day    Packs/day: 0.50    Types: Cigarettes   Smokeless tobacco: Never  Substance and Sexual Activity   Alcohol use: Yes     Comment: occassional 24oz beer at night   Drug use: No   Sexual activity: Not on file  Other Topics Concern   Not on file  Social History Narrative   Not on file   Social Determinants of Health   Financial Resource Strain: Not on file  Food Insecurity: Not on file  Transportation Needs: Not on file  Physical Activity: Not on file  Stress: Not on file  Social Connections: Not on file   Additional Social History:    Allergies:  No Known Allergies  Labs:  Results for orders placed or performed during the hospital encounter of 01/06/21 (from the past 48 hour(s))  Resp Panel by RT-PCR (Flu A&B, Covid) Nasopharyngeal Swab     Status: None   Collection Time: 01/06/21 10:44 PM   Specimen: Nasopharyngeal Swab; Nasopharyngeal(NP) swabs in vial transport medium  Result Value Ref Range   SARS Coronavirus 2 by RT PCR NEGATIVE NEGATIVE    Comment: (NOTE) SARS-CoV-2 target nucleic acids are NOT DETECTED.  The SARS-CoV-2 RNA is generally detectable in upper respiratory specimens during the acute phase of infection. The lowest concentration of SARS-CoV-2 viral copies this assay can detect is 138 copies/mL. A negative result does not preclude SARS-Cov-2 infection and should not be used as the sole basis for treatment or other patient management decisions. A negative result may occur with  improper specimen collection/handling, submission of specimen other than nasopharyngeal swab, presence of viral mutation(s) within the areas targeted by this assay, and inadequate number of viral copies(<138 copies/mL). A negative result must be combined with clinical observations, patient history, and epidemiological information. The expected result is Negative.  Fact Sheet for Patients:  BloggerCourse.com  Fact Sheet for Healthcare Providers:  SeriousBroker.it  This test is no t yet approved or cleared by the [place] FDA and  has been  authorized for detection and/or diagnosis of SARS-CoV-2 by FDA under an Emergency Use Authorization (EUA). This EUA will remain  in effect (meaning this test can be used) for the duration of the COVID-19 declaration under Section 564(b)(1) of the Act, 21 U.S.C.section 360bbb-3(b)(1), unless the authorization is terminated  or revoked sooner.       Influenza A by PCR NEGATIVE NEGATIVE   Influenza B by PCR NEGATIVE NEGATIVE    Comment: (NOTE) The Xpert Xpress SARS-CoV-2/FLU/RSV plus assay is intended as an aid in the diagnosis of influenza from Nasopharyngeal swab specimens and should not be used as a sole basis for treatment. Nasal washings and aspirates are unacceptable for Xpert Xpress SARS-CoV-2/FLU/RSV testing.  Fact Sheet for Patients: BloggerCourse.com  Fact Sheet for Healthcare Providers: SeriousBroker.it  This test is not yet approved or cleared by the [place] FDA and has been authorized for detection and/or diagnosis of SARS-CoV-2 by FDA under an Emergency Use Authorization (EUA). This EUA will remain in effect (meaning this test can be used) for the duration of the COVID-19 declaration under Section 564(b)(1) of the Act, 21 U.S.C. section 360bbb-3(b)(1), unless the authorization is terminated or revoked.  Performed at [facility], [address] [place], [place] [address]   Comprehensive metabolic panel     Status: Abnormal   Collection Time: 01/06/21 10:44 PM  Result Value Ref Range   Sodium 136 135 - 145 mmol/L   Potassium 3.7 3.5 - 5.1 mmol/L   Chloride 101 98 - 111 mmol/L   CO2 20 (L) 22 - 32 mmol/L   Glucose, Bld 96 70 - 99 mg/dL    Comment: Glucose reference range applies only to samples taken after fasting for at least 8 hours.   BUN 10 6 - 20 mg/dL   Creatinine, Ser 2.83 0.61 - 1.24 mg/dL   Calcium 9.3 8.9 - 15.1 mg/dL   Total Protein 6.5 6.5 - 8.1 g/dL   Albumin 3.8 3.5 - 5.0 g/dL    AST 16 15 - 41 U/L   ALT 21 0 - 44 U/L   Alkaline Phosphatase 42 38 - 126 U/L   Total Bilirubin 0.8 0.3 - 1.2 mg/dL   GFR, Estimated >76 >16 mL/min    Comment: (NOTE) Calculated using the CKD-EPI Creatinine Equation (2021)    Anion gap 15 5 - 15    Comment: Performed at [facility], [address], [place], [place] [address]  Ethanol     Status: Abnormal   Collection Time: 01/06/21 10:44 PM  Result Value Ref Range   Alcohol, Ethyl (B) 121 (H) <10 mg/dL    Comment: (NOTE) Lowest detectable limit for serum alcohol is 10 mg/dL.  For medical purposes only. Performed at [facility] [place], [address] [place], [place] [address]   Salicylate level     Status: Abnormal   Collection Time: 01/06/21 10:44 PM  Result Value Ref Range   Salicylate Lvl <7.0 (L) 7.0 - 30.0 mg/dL    Comment: Performed at [facility], [address] [place], [place] [address]  Acetaminophen level     Status: Abnormal   Collection Time: 01/06/21 10:44 PM  Result Value Ref Range   Acetaminophen (Tylenol), Serum <10 (L) 10 - 30 ug/mL    Comment: (NOTE) Therapeutic concentrations vary significantly. A range of 10-30 ug/mL  may be an effective concentration for many patients. However, some  are best treated at concentrations outside of this range. Acetaminophen concentrations >150 ug/mL at 4 hours after ingestion  and >50 ug/mL at 12 hours after ingestion are often associated with  toxic reactions.  Performed at [facility], [address] [place], [place] [address]   cbc     Status: Abnormal   Collection Time: 01/06/21 10:44 PM  Result Value Ref Range   WBC 10.5 4.0 - 10.5 K/uL   RBC 6.00 (H) 4.22 - 5.81 MIL/uL   Hemoglobin 18.1 (H) 13.0 - 17.0 g/dL   HCT 00.9 (H) 38.1 - 82.9 %   MCV 90.3 80.0 - 100.0 fL   MCH 30.2 26.0 - 34.0 pg   MCHC 33.4 30.0 - 36.0 g/dL   RDW 93.7 16.9 - 67.8 %   Platelets 305 150 - 400 K/uL    Comment: REPEATED TO VERIFY   nRBC 0.0 0.0  - 0.2 %    Comment: Performed at [facility], [address] [place], [place] [address]    No current facility-administered medications for this encounter.   Current Outpatient Medications  Medication Sig Dispense Refill   hydrOXYzine (ATARAX/VISTARIL) 25 MG tablet Take 1 tablet by mouth once every morning and take 2 tablets by mouth at  bedtime. 90 tablet 1   ibuprofen (ADVIL) 200 MG tablet Take 200 mg by mouth every 6 (six) hours as needed for mild pain, moderate pain or headache.     lisinopril (ZESTRIL) 5 MG tablet Take 1 tablet (5 mg total) by mouth once daily for high blood pressure. 30 tablet 0   pantoprazole (PROTONIX) 40 MG tablet Take 1 tablet (40 mg total) by mouth once daily for acid reflux. 30 tablet 0   risperiDONE (RISPERDAL) 0.5 MG tablet Take 1 tablet (0.5 mg total) by mouth 2 (two) times daily. 60 tablet 1   sertraline (ZOLOFT) 50 MG tablet Take 1 tablet by mouth once daily. 30 tablet 1    Musculoskeletal: Strength & Muscle Tone: within normal limits Gait & Station: normal Patient leans: N/A  Psychiatric Specialty Exam:  Presentation  General Appearance: Appropriate for Environment  Eye Contact:Fair  Speech:Clear and Coherent  Speech Volume:Normal  Handedness:Right   Mood and Affect  Mood:Anxious; Irritable  Affect:Congruent   Thought Process  Thought Processes:Coherent; Goal Directed  Descriptions of Associations:Intact  Orientation:Full (Time, Place and Person)  Thought Content:Logical; Tangential  History of Schizophrenia/Schizoaffective disorder:No  Duration of Psychotic Symptoms:No data recorded Hallucinations:Hallucinations: None  Ideas of Reference:None  Suicidal Thoughts:Suicidal Thoughts: Yes, Passive SI Passive Intent and/or Plan: Without Intent; With Plan; Without Means to Carry Out  Homicidal Thoughts:Homicidal Thoughts: No   Sensorium  Memory:Immediate Good; Recent Good; Remote  Good  Judgment:Fair  Insight:Fair   Executive Functions  Concentration:Fair  Attention Span:Fair  Recall:Fair  Fund of Knowledge:Fair  Language:Good   Psychomotor Activity  Psychomotor Activity:Psychomotor Activity: Normal   Assets  Assets:Communication Skills; Desire for Improvement; Housing; Resilience; Social Support   Sleep  Sleep:Sleep: Fair   Physical Exam: Physical Exam Vitals and nursing note reviewed.  Constitutional:      Appearance: Normal appearance. He is normal weight.  HENT:     Head: Normocephalic and atraumatic.     Right Ear: External ear normal.     Left Ear: External ear normal.     Nose: Nose normal.  Cardiovascular:     Rate and Rhythm: Normal rate.     Pulses: Normal pulses.  Pulmonary:     Effort: Pulmonary effort is normal.  Musculoskeletal:        General: Normal range of motion.     Cervical back: Normal range of motion and neck supple.  Neurological:     General: No focal deficit present.     Mental Status: He is alert and oriented to person, place, and time.  Psychiatric:        Attention and Perception: Attention and perception normal.        Mood and Affect: Mood is anxious and depressed. Affect is blunt, flat and inappropriate.        Speech: Speech is rapid and pressured.        Behavior: Behavior is agitated and withdrawn.        Thought Content: Thought content includes suicidal ideation.        Cognition and Memory: Cognition and memory normal.        Judgment: Judgment is impulsive and inappropriate.   Review of Systems  Psychiatric/Behavioral:  Positive for depression, substance abuse and suicidal ideas. The patient is nervous/anxious.   All other systems reviewed and are negative. Blood pressure 136/88, pulse 98, temperature 98.6 F (37 C), temperature source Oral, resp. rate 20, height 5\' 4"  (1.626 m), weight 71 kg, SpO2 95 %. Body mass  index is 26.87 kg/m.  Treatment Plan Summary: Daily contact with patient  to assess and evaluate symptoms and progress in treatment and Plan the patient remained under observation overnight and will be reassessed in the a.m. to determine if he meets the criteria for psychiatric inpatient admission; he could be discharged home.  Disposition: No evidence of imminent risk to self or others at present.   Supportive therapy provided about ongoing stressors. The patient remained under observation overnight and will be reassessed in the a.m. to determine if he meets the criteria for psychiatric inpatient admission; he could be discharged home.  Gillermo Murdoch, NP 01/07/2021 2:31 AM

## 2021-01-07 NOTE — ED Notes (Signed)
Pt given breakfast tray

## 2021-01-07 NOTE — ED Notes (Signed)
IVC/psych consult ordered

## 2021-01-07 NOTE — Consult Note (Signed)
[facility] Face-to-Face Psychiatry Consult   Reason for Consult:  reassess Referring Physician:  EDP Patient Identification: Joshua [name] MRN:  [id number] Principal Diagnosis: <principal problem not specified> Diagnosis:  Active Problems:   ADD (attention deficit disorder)   Dysthymia   Alcohol abuse   Suicide attempt (HCC)   Alcohol use disorder, severe, dependence (HCC)   Major depressive disorder, recurrent severe without psychotic features (HCC)   Alcohol-induced mood disorder (HCC)   Total Time spent with patient: 30 minutes  Subjective:   Joshua [name] is a 35 y.o. male patient admitted with "I can't keep staying where I am. I need better housing.".  HPI:  See previous note. Writer saw patient for re-assessment this morning. He denies suicidal or homicidal thoughts this morning, after sleeping well last night. Patient denies auditory or visual hallucinations or paranoia. He expresses that he is frustrated with the place he is staying at now, because he works nights and his roommates "keep me awake in the day, I only get 4-5 hours of sleep." Patient states he is looking for housing. Discussed with patient that he cannot be admitted because he doesn't like the place he lives. Patient states he does have a full-time job; he went out drinking last night and came here because he felt that he had too much to drink but feels safe for discharge and will look into other resources for housing. Also encouraged patient to follow up with [facility], which he says he hasn't done after the last 2 inpatient discharges.  Patient identifies his daughter as a reason to get better and stay alive. Patient does not meet criteria for inpatient psychiatric hospitalization.  Past Psychiatric History: see previous  Risk to Self:   Risk to Others:   Prior Inpatient Therapy:   Prior Outpatient Therapy:    Past Medical History:  Past Medical History:  Diagnosis Date   ADD (attention deficit disorder)    Depression     Paranoid (HCC)     Past Surgical History:  Procedure Laterality Date   HERNIA REPAIR     Family History: History reviewed. No pertinent family history. Family Psychiatric  History: see previous Social History:  Social History   Substance and Sexual Activity  Alcohol Use Yes   Comment: occassional 24oz beer at night     Social History   Substance and Sexual Activity  Drug Use No    Social History   Socioeconomic History   Marital status: Single    Spouse name: Not on file   Number of children: Not on file   Years of education: Not on file   Highest education level: Not on file  Occupational History   Not on file  Tobacco Use   Smoking status: Every Day    Packs/day: 0.50    Types: Cigarettes   Smokeless tobacco: Never  Substance and Sexual Activity   Alcohol use: Yes    Comment: occassional 24oz beer at night   Drug use: No   Sexual activity: Not on file  Other Topics Concern   Not on file  Social History Narrative   Not on file   Social Determinants of Health   Financial Resource Strain: Not on file  Food Insecurity: Not on file  Transportation Needs: Not on file  Physical Activity: Not on file  Stress: Not on file  Social Connections: Not on file   Additional Social History:    Allergies:  No Known Allergies  Labs:  Results for orders placed or  performed during the hospital encounter of 01/06/21 (from the past 48 hour(s))  Resp Panel by RT-PCR (Flu A&B, Covid) Nasopharyngeal Swab     Status: None   Collection Time: 01/06/21 10:44 PM   Specimen: Nasopharyngeal Swab; Nasopharyngeal(NP) swabs in vial transport medium  Result Value Ref Range   SARS Coronavirus 2 by RT PCR NEGATIVE NEGATIVE    Comment: (NOTE) SARS-CoV-2 target nucleic acids are NOT DETECTED.  The SARS-CoV-2 RNA is generally detectable in upper respiratory specimens during the acute phase of infection. The lowest concentration of SARS-CoV-2 viral copies this assay can detect  is 138 copies/mL. A negative result does not preclude SARS-Cov-2 infection and should not be used as the sole basis for treatment or other patient management decisions. A negative result may occur with  improper specimen collection/handling, submission of specimen other than nasopharyngeal swab, presence of viral mutation(s) within the areas targeted by this assay, and inadequate number of viral copies(<138 copies/mL). A negative result must be combined with clinical observations, patient history, and epidemiological information. The expected result is Negative.  Fact Sheet for Patients:  BloggerCourse.com  Fact Sheet for Healthcare Providers:  SeriousBroker.it  This test is no t yet approved or cleared by the [place] FDA and  has been authorized for detection and/or diagnosis of SARS-CoV-2 by FDA under an Emergency Use Authorization (EUA). This EUA will remain  in effect (meaning this test can be used) for the duration of the COVID-19 declaration under Section 564(b)(1) of the Act, 21 U.S.C.section 360bbb-3(b)(1), unless the authorization is terminated  or revoked sooner.       Influenza A by PCR NEGATIVE NEGATIVE   Influenza B by PCR NEGATIVE NEGATIVE    Comment: (NOTE) The Xpert Xpress SARS-CoV-2/FLU/RSV plus assay is intended as an aid in the diagnosis of influenza from Nasopharyngeal swab specimens and should not be used as a sole basis for treatment. Nasal washings and aspirates are unacceptable for Xpert Xpress SARS-CoV-2/FLU/RSV testing.  Fact Sheet for Patients: BloggerCourse.com  Fact Sheet for Healthcare Providers: SeriousBroker.it  This test is not yet approved or cleared by the [place] FDA and has been authorized for detection and/or diagnosis of SARS-CoV-2 by FDA under an Emergency Use Authorization (EUA). This EUA will remain in effect (meaning this  test can be used) for the duration of the COVID-19 declaration under Section 564(b)(1) of the Act, 21 U.S.C. section 360bbb-3(b)(1), unless the authorization is terminated or revoked.  Performed at [facility], [address] [place], [place] [address]   Comprehensive metabolic panel     Status: Abnormal   Collection Time: 01/06/21 10:44 PM  Result Value Ref Range   Sodium 136 135 - 145 mmol/L   Potassium 3.7 3.5 - 5.1 mmol/L   Chloride 101 98 - 111 mmol/L   CO2 20 (L) 22 - 32 mmol/L   Glucose, Bld 96 70 - 99 mg/dL    Comment: Glucose reference range applies only to samples taken after fasting for at least 8 hours.   BUN 10 6 - 20 mg/dL   Creatinine, Ser 7.51 0.61 - 1.24 mg/dL   Calcium 9.3 8.9 - 70.0 mg/dL   Total Protein 6.5 6.5 - 8.1 g/dL   Albumin 3.8 3.5 - 5.0 g/dL   AST 16 15 - 41 U/L   ALT 21 0 - 44 U/L   Alkaline Phosphatase 42 38 - 126 U/L   Total Bilirubin 0.8 0.3 - 1.2 mg/dL   GFR, Estimated >17 >49 mL/min  Comment: (NOTE) Calculated using the CKD-EPI Creatinine Equation (2021)    Anion gap 15 5 - 15    Comment: Performed at [facility], [address] [place], [place] [address]  Ethanol     Status: Abnormal   Collection Time: 01/06/21 10:44 PM  Result Value Ref Range   Alcohol, Ethyl (B) 121 (H) <10 mg/dL    Comment: (NOTE) Lowest detectable limit for serum alcohol is 10 mg/dL.  For medical purposes only. Performed at [facility], [address] [place], [place] [address]   Salicylate level     Status: Abnormal   Collection Time: 01/06/21 10:44 PM  Result Value Ref Range   Salicylate Lvl <7.0 (L) 7.0 - 30.0 mg/dL    Comment: Performed at [facility], [address] [place], [place] [address]  Acetaminophen level     Status: Abnormal   Collection Time: 01/06/21 10:44 PM  Result Value Ref Range   Acetaminophen (Tylenol), Serum <10 (L) 10 - 30 ug/mL    Comment: (NOTE) Therapeutic concentrations vary  significantly. A range of 10-30 ug/mL  may be an effective concentration for many patients. However, some  are best treated at concentrations outside of this range. Acetaminophen concentrations >150 ug/mL at 4 hours after ingestion  and >50 ug/mL at 12 hours after ingestion are often associated with  toxic reactions.  Performed at [facility], [address] [place], [place] [address]   cbc     Status: Abnormal   Collection Time: 01/06/21 10:44 PM  Result Value Ref Range   WBC 10.5 4.0 - 10.5 K/uL   RBC 6.00 (H) 4.22 - 5.81 MIL/uL   Hemoglobin 18.1 (H) 13.0 - 17.0 g/dL   HCT 29.5 (H) 62.1 - 30.8 %   MCV 90.3 80.0 - 100.0 fL   MCH 30.2 26.0 - 34.0 pg   MCHC 33.4 30.0 - 36.0 g/dL   RDW 65.7 84.6 - 96.2 %   Platelets 305 150 - 400 K/uL    Comment: REPEATED TO VERIFY   nRBC 0.0 0.0 - 0.2 %    Comment: Performed at [facility], [address], [place], [place] [address]  Urine Drug Screen, Qualitative     Status: None   Collection Time: 01/07/21  4:30 AM  Result Value Ref Range   Tricyclic, Ur Screen NONE DETECTED NONE DETECTED   Amphetamines, Ur Screen NONE DETECTED NONE DETECTED   MDMA (Ecstasy)Ur Screen NONE DETECTED NONE DETECTED   Cocaine Metabolite,Ur [place] NONE DETECTED NONE DETECTED   Opiate, Ur Screen NONE DETECTED NONE DETECTED   Phencyclidine (PCP) Ur S NONE DETECTED NONE DETECTED   Cannabinoid 50 Ng, Ur [place] NONE DETECTED NONE DETECTED   Barbiturates, Ur Screen NONE DETECTED NONE DETECTED   Benzodiazepine, Ur Scrn NONE DETECTED NONE DETECTED   Methadone Scn, Ur NONE DETECTED NONE DETECTED    Comment: (NOTE) Tricyclics + metabolites, urine    Cutoff 1000 ng/mL Amphetamines + metabolites, urine  Cutoff 1000 ng/mL MDMA (Ecstasy), urine              Cutoff 500 ng/mL Cocaine Metabolite, urine          Cutoff 300 ng/mL Opiate + metabolites, urine        Cutoff 300 ng/mL Phencyclidine (PCP), urine         Cutoff 25 ng/mL Cannabinoid, urine                  Cutoff 50 ng/mL Barbiturates +  metabolites, urine  Cutoff 200 ng/mL Benzodiazepine, urine              Cutoff 200 ng/mL Methadone, urine                   Cutoff 300 ng/mL  The urine drug screen provides only a preliminary, unconfirmed analytical test result and should not be used for non-medical purposes. Clinical consideration and professional judgment should be applied to any positive drug screen result due to possible interfering substances. A more specific alternate chemical method must be used in order to obtain a confirmed analytical result. Gas chromatography / mass spectrometry (GC/MS) is the preferred confirm atory method. Performed at [facility], [address] [place], [place] [address]     No current facility-administered medications for this encounter.   Current Outpatient Medications  Medication Sig Dispense Refill   hydrOXYzine (ATARAX/VISTARIL) 25 MG tablet Take 1 tablet by mouth once every morning and take 2 tablets by mouth at bedtime. 90 tablet 1   ibuprofen (ADVIL) 200 MG tablet Take 200 mg by mouth every 6 (six) hours as needed for mild pain, moderate pain or headache.     lisinopril (ZESTRIL) 5 MG tablet Take 1 tablet (5 mg total) by mouth once daily for high blood pressure. 30 tablet 0   pantoprazole (PROTONIX) 40 MG tablet Take 1 tablet (40 mg total) by mouth once daily for acid reflux. 30 tablet 0   risperiDONE (RISPERDAL) 0.5 MG tablet Take 1 tablet (0.5 mg total) by mouth 2 (two) times daily. 60 tablet 1   sertraline (ZOLOFT) 50 MG tablet Take 1 tablet by mouth once daily. 30 tablet 1    Musculoskeletal: Strength & Muscle Tone: within normal limits Gait & Station: normal Patient leans: N/A            Psychiatric Specialty Exam:  Presentation  General Appearance: Appropriate for Environment  Eye Contact:Fair  Speech:Clear and Coherent  Speech Volume:Normal  Handedness:Right   Mood and Affect   Mood:Irritable  Affect:Congruent   Thought Process  Thought Processes:Coherent  Descriptions of Associations:Intact  Orientation:Full (Time, Place and Person)  Thought Content:Logical; Tangential  History of Schizophrenia/Schizoaffective disorder:No  Duration of Psychotic Symptoms:No data recorded Hallucinations:Hallucinations: None  Ideas of Reference:None  Suicidal Thoughts: No Homicidal Thoughts:Homicidal Thoughts: No   Sensorium  Memory:Immediate Good; Recent Good; Remote Good  Judgment:Fair  Insight:Fair   Executive Functions  Concentration:Fair  Attention Span:Fair  Recall:Fair  Fund of Knowledge:Fair  Language:Good   Psychomotor Activity  Psychomotor Activity:Psychomotor Activity: Normal   Assets  Assets:Communication Skills; Desire for Improvement; Financial Resources/Insurance; Transportation; Talents/Skills; Resilience; Physical Health   Sleep  Sleep:Sleep: Fair   Physical Exam: Physical Exam ROS Blood pressure 140/88, pulse 70, temperature 98.6 F (37 C), temperature source Oral, resp. rate 18, height 5\' 4"  (1.626 m), weight 71 kg, SpO2 95 %. Body mass index is 26.87 kg/m.  Treatment Plan Summary: Plan Follow up with outpatient providers for alcohol misuse and mental health treatment. Reviewed with EDP    Disposition: No evidence of imminent risk to self or others at present.   Patient does not meet criteria for psychiatric inpatient admission. Supportive therapy provided about ongoing stressors. Discussed crisis plan, support from social network, calling 911, coming to the Emergency Department, and calling Suicide Hotline.  , NP 01/07/2021 7:18 PM

## 2021-01-07 NOTE — ED Provider Notes (Signed)
Patient seen by psychiatry service and cleared for discharge.  Discharged in stable condition with plan to follow-up with [facility].   Gilles Chiquito, MD 01/07/21 1045

## 2021-01-07 NOTE — ED Notes (Signed)
IVC/consult completed will reassess in the A.M. for dispo or admit

## 2021-01-07 NOTE — ED Notes (Signed)
E-signature not working at this time. Pt verbalized understanding of D/C instructions, prescriptions and follow up care with no further questions at this time. Pt in NAD and ambulatory at time of D/C. Cab called for patient to take patient to [facility]

## 2021-01-07 NOTE — BH Assessment (Signed)
Comprehensive Clinical Assessment (CCA) Note  01/07/2021 Coady Rybicki [id number] Recommendations for Services/Supports/Treatments: Consulted with Lynder Parents., NP, who recommended pt be observed overnight and reassessed in the AM. Notified Dr. Karma Greaser and Mitchell Heir, RN of disposition recommendation.   Joshua [name] is a 35 year old, English speaking, Caucasian male with a PMH of depression, recurrent and severe ADD inattentive type. Pt also has a hx of severe alcohol abuse. Pt presented to [facility] ED voluntarily, but was later IVC'd due to endorsing thoughts of SI and HI towards his mother in law. Upon assessment, pt presented with I don't wanna be here no more. Pt was visibly irritable and under the influence of alcohol. Pt identified his main stressors as ongoing issues with custody of his daughter and being an eviction notice from his current place of residence. Pt mumbled, irritably, I might as well kill myself and say fuck it. Pt reported that she has not begun any grief counseling and feels overwhelmed by the internal pain.  Pt explained that he'd consumed an unknown amount of alcohol in the last 24 hours. The pt reported that he smokes a few puffs of marijuana on a very infrequent basis. Pt had no insight and expressed being victimized. Pt's protective factors are being employed and being physically healthy. On assessment, pt. has impaired judgment and slurred speech. Pt's thoughts were relevant and coherent. Pt's UDS was unremarkable; BAL 121. Pt was oriented x4. Pt's mood was hopeless, irritable, and pessimistic; affect is congruent. Pt denied current HI/AV/H.  Chief Complaint:  Chief Complaint  Patient presents with   Psychiatric Evaluation   Visit Diagnosis: ADD (attention deficit disorder)   Dysthymia   Alcohol abuse   Suicide attempt ([place])   Alcohol use disorder, severe, dependence ([place])   Major depressive disorder, recurrent severe without psychotic features ([place])   Alcohol-induced  mood disorder ([place])      CCA Screening, Triage and Referral (STR)  Patient Reported Information How did you hear about [place]? Self  Referral name: Self  Referral phone number: [phone]   Whom do you see for routine medical problems? I don't have a doctor  Practice/Facility Name: No data recorded Practice/Facility Phone Number: No data recorded Name of Contact: No data recorded Contact Number: No data recorded Contact Fax Number: No data recorded Prescriber Name: No data recorded Prescriber Address (if known): No data recorded  What Is the Reason for Your Visit/Call Today? Pt presented with thoughts of passive SI/HI; hx of the same.  How Long Has This Been Causing You Problems? 1 wk - 1 month  What Do You Feel Would Help You the Most Today? Treatment for Depression or other mood problem; Alcohol or Drug Use Treatment   Have You Recently Been in Any Inpatient Treatment (Hospital/Detox/Crisis Center/28-Day Program)? No  Name/Location of Program/Hospital:No data recorded How Long Were You There? No data recorded When Were You Discharged? No data recorded  Have You Ever Received Services From [facility] Before? No  Who Do You See at [facility]? No data recorded  Have You Recently Had Any Thoughts About Hurting Yourself? Yes  Are You Planning to Commit Suicide/Harm Yourself At This time? No   Have you Recently Had Thoughts About [place]? No  Explanation: No data recorded  Have You Used Any Alcohol or Drugs in the Past 24 Hours? Yes  How Long Ago Did You Use Drugs or Alcohol? No data recorded What Did You Use and How Much? Alcohol   Do You Currently Have a  Therapist/Psychiatrist? Yes  Name of Therapist/Psychiatrist: [facility]-Dr.Litz   Have You Been Recently Discharged From Any Office Practice or Programs? No  Explanation of Discharge From Practice/Program: No data recorded    CCA Screening Triage Referral Assessment Type of Contact:  Face-to-Face  Is this Initial or Reassessment? No data recorded Date Telepsych consult ordered in [facility]:  No data recorded Time Telepsych consult ordered in [facility]:  No data recorded  Patient Reported Information Reviewed? Yes  Patient Left Without Being Seen? No data recorded Reason for Not Completing Assessment: No data recorded  Collateral Involvement: None provided   Does Patient Have a Court Appointed Legal Guardian? No data recorded Name and Contact of Legal Guardian: No data recorded If Minor and Not Living with Parent(s), Who has Custody? n/a  Is CPS involved or ever been involved? Never  Is APS involved or ever been involved? Never   Patient Determined To Be At Risk for Harm To Self or Others Based on Review of Patient Reported Information or Presenting Complaint? No  Method: No data recorded Availability of Means: No data recorded Intent: No data recorded Notification Required: No data recorded Additional Information for Danger to Others Potential: No data recorded Additional Comments for Danger to Others Potential: No data recorded Are There Guns or Other Weapons in Your Home? No data recorded Types of Guns/Weapons: No data recorded Are These Weapons Safely Secured?                            No data recorded Who Could Verify You Are Able To Have These Secured: No data recorded Do You Have any Outstanding Charges, Pending Court Dates, Parole/Probation? No data recorded Contacted To Inform of Risk of Harm To Self or Others: No data recorded  Location of Assessment: [facility] ED   Does Patient Present under Involuntary Commitment? Yes  IVC Papers Initial File Date: 01/06/21   [place] of Residence:    Patient Currently Receiving the Following Services: Medication Management; Peer Support Services   Determination of Need: Emergent (2 hours)   Options For Referral: ED Referral; Therapeutic Triage Services     CCA Biopsychosocial Intake/Chief Complaint:   Psych Eval  Current Symptoms/Problems: Depression   Patient Reported Schizophrenia/Schizoaffective Diagnosis in Past: No   Strengths: Pt able to ask for help  Preferences: None noted  Abilities: Able to take care of self   Type of Services Patient Feels are Needed: Treatment   Initial Clinical Notes/Concerns: Pt was just discharge with a recommendation to follow up with [facility]; pt reported that he didn't like [facility] as they wouldn't listen to him.   Mental Health Symptoms Depression:   Sleep (too much or little); Hopelessness; Worthlessness   Duration of Depressive symptoms:  Less than two weeks   Mania:   N/A   Anxiety:    N/A   Psychosis:   None   Duration of Psychotic symptoms: No data recorded  Trauma:   N/A   Obsessions:   N/A   Compulsions:   Repeated behaviors/mental acts; Intended to reduce stress or prevent another outcome; "Driven" to perform behaviors/acts; Poor Insight   Inattention:   N/A   Hyperactivity/Impulsivity:   N/A   Oppositional/Defiant Behaviors:   Resentful; Angry   Emotional Irregularity:   Potentially harmful impulsivity; Recurrent suicidal behaviors/gestures/threats; Mood lability   Other Mood/Personality Symptoms:  No data recorded   Mental Status Exam Appearance and self-care  Stature:   Average   Weight:  Average weight   Clothing:   -- (In scrubs)   Grooming:   Normal   Cosmetic use:   None   Posture/gait:   Normal   Motor activity:   Not Remarkable   Sensorium  Attention:   Distractible   Concentration:   Normal   Orientation:   Object; Person; Place; Situation; Time   Recall/memory:   Normal   Affect and Mood  Affect:   Negative; Congruent   Mood:   Irritable; Pessimistic; Hopeless   Relating  Eye contact:   Avoided   Facial expression:   Angry   Attitude toward examiner:   Irritable; Passive   Thought and Language  Speech flow:  Slurred   Thought content:   Appropriate  to Mood and Circumstances   Preoccupation:   Suicide   Hallucinations:   None   Organization:  No data recorded  [employer] of Knowledge:   Fair   Intelligence:   Average   Abstraction:   Functional   Judgement:   Impaired   Reality Testing:   Adequate   Insight:   Poor   Decision Making:   Impulsive   Social Functioning  Social Maturity:   Isolates; Self-centered   Social Judgement:   "Games developer"; Victimized   Stress  Stressors:   Museum/gallery curator; Relationship; Transitions; Family conflict; Housing   Coping Ability:   Overwhelmed   Skill Deficits:   Responsibility; Self-control   Supports:   Support needed     Religion: Religion/Spirituality Are You A Religious Person?: No How Might This Affect Treatment?: n/a  Leisure/Recreation: Leisure / Recreation Do You Have Hobbies?: No  Exercise/Diet: Exercise/Diet Do You Exercise?: No Have You Gained or Lost A Significant Amount of Weight in the Past Six Months?: No Do You Follow a Special Diet?: No Do You Have Any Trouble Sleeping?: Yes Explanation of Sleeping Difficulties: Due to work schedule   CCA Employment/Education Employment/Work Situation: Employment / Work Situation Employment Situation: Employed Work Stressors: Does not make enough money. Patient's Job has Been Impacted by Current Illness: No Has Patient ever Been in the [employer]?: No  Education: Education Is Patient Currently Attending School?: No Did You Nutritional therapist?: No Did You Have An Individualized Education Program (IIEP): No Did You Have Any Difficulty At School?: No Patient's Education Has Been Impacted by Current Illness: No   CCA Family/Childhood History Family and Relationship History: Family history Marital status: Single Does patient have children?: Yes How many children?: 1 How is patient's relationship with their children?: Patient is currently unable to see his daughter who is 61-1/2 years  old.  They are supposed to go to court 7/13 and there are some things he is supposed to do to get to see her.  Childhood History:  Childhood History By whom was/is the patient raised?: Mother Did patient suffer any verbal/emotional/physical/sexual abuse as a child?: No Did patient suffer from severe childhood neglect?: No Has patient ever been sexually abused/assaulted/raped as an adolescent or adult?: No Was the patient ever a victim of a crime or a disaster?: No Witnessed domestic violence?: No Has patient been affected by domestic violence as an adult?: No  Child/Adolescent Assessment:     CCA Substance Use Alcohol/Drug Use: Alcohol / Drug Use Pain Medications: See PTA Prescriptions: See PTA Over the Counter: See PTA History of alcohol / drug use?: Yes Longest period of sobriety (when/how long): Unable to quantify Negative Consequences of Use: Personal relationships, Legal Withdrawal Symptoms: Agitation, Change in  blood pressure Substance #1 Name of Substance 1: Alcohol 1 - Last Use / Amount: 12/28/2020                       ASAM's:  Six Dimensions of Multidimensional Assessment  Dimension 1:  Acute Intoxication and/or Withdrawal Potential:   Dimension 1:  Description of individual's past and current experiences of substance use and withdrawal: No current withdrawal experienced currently  Dimension 2:  Biomedical Conditions and Complications:   Dimension 2:  Description of patient's biomedical conditions and  complications: Patient has high blood pressure  Dimension 3:  Emotional, Behavioral, or Cognitive Conditions and Complications:  Dimension 3:  Description of emotional, behavioral, or cognitive conditions and complications: Patient is currently having emotional issues with the custody of his daughter  Dimension 4:  Readiness to Change:  Dimension 4:  Description of Readiness to Change criteria: Patient unsure of his willingness to change  Dimension 5:   Relapse, Continued use, or Continued Problem Potential:  Dimension 5:  Relapse, continued use, or continued problem potential critiera description: Patient has a high potential for relapse  Dimension 6:  Recovery/Living Environment:  Dimension 6:  Recovery/Iiving environment criteria description: Unhealthy living environment  ASAM Severity Score: ASAM's Severity Rating Score: 13  ASAM Recommended Level of Treatment: ASAM Recommended Level of Treatment: Level II Intensive Outpatient Treatment   Substance use Disorder (SUD) Substance Use Disorder (SUD)  Checklist Symptoms of Substance Use: Continued use despite having a persistent/recurrent physical/psychological problem caused/exacerbated by use, Evidence of tolerance, Presence of craving or strong urge to use, Social, occupational, recreational activities given up or reduced due to use, Recurrent use that results in a failure to fulfill major role obligations (work, school, home), Substance(s) often taken in larger amounts or over longer times than was intended, Large amounts of time spent to obtain, use or recover from the substance(s), Continued use despite persistent or recurrent social, interpersonal problems, caused or exacerbated by use, Persistent desire or unsuccessful efforts to cut down or control use, Repeated use in physically hazardous situations  Recommendations for Services/Supports/Treatments: Recommendations for Services/Supports/Treatments Recommendations For Services/Supports/Treatments: Other (Comment), SAIOP (Substance Abuse Intensive Outpatient Program)  DSM5 Diagnoses: Patient Active Problem List   Diagnosis Date Noted   Alcohol-induced mood disorder ([place]) 12/28/2020   Major depressive disorder, recurrent severe without psychotic features ([place]) 07/12/2020   Alcohol use disorder, severe, dependence ([place]) 04/03/2020   Alcohol abuse 03/28/2020   Suicide attempt ([place]) 03/28/2020   ADD (attention deficit disorder) 10/21/2015    Dysthymia 10/21/2015    Piper Hassebrock R Turkessa Ostrom, LCAS

## 2021-02-02 ENCOUNTER — Other Ambulatory Visit: Payer: Self-pay

## 2021-04-08 ENCOUNTER — Other Ambulatory Visit: Payer: Self-pay

## 2021-04-08 ENCOUNTER — Encounter (HOSPITAL_COMMUNITY): Payer: Self-pay

## 2021-04-08 ENCOUNTER — Other Ambulatory Visit (INDEPENDENT_AMBULATORY_CARE_PROVIDER_SITE_OTHER): Payer: Medicaid Other | Admitting: Family

## 2021-04-08 ENCOUNTER — Emergency Department
Admission: EM | Admit: 2021-04-08 | Discharge: 2021-04-08 | Disposition: A | Discharge status: Home / Self Care | Payer: Medicaid Other | Attending: Student in an Organized Health Care Education/Training Program | Admitting: Student in an Organized Health Care Education/Training Program

## 2021-04-08 DIAGNOSIS — F17200 Nicotine dependence, unspecified, uncomplicated: Secondary | ICD-10-CM | POA: Insufficient documentation

## 2021-04-08 DIAGNOSIS — F1190 Opioid use, unspecified, uncomplicated: Secondary | ICD-10-CM

## 2021-04-08 DIAGNOSIS — Z139 Encounter for screening, unspecified: Secondary | ICD-10-CM

## 2021-04-08 DIAGNOSIS — Z20822 Contact with and (suspected) exposure to covid-19: Secondary | ICD-10-CM | POA: Insufficient documentation

## 2021-04-08 DIAGNOSIS — F1510 Other stimulant abuse, uncomplicated: Secondary | ICD-10-CM | POA: Insufficient documentation

## 2021-04-08 HISTORY — DX: Other specified depressive episodes: F32.89

## 2021-04-08 HISTORY — DX: Generalized anxiety disorder: F41.1

## 2021-04-08 HISTORY — DX: Essential (primary) hypertension: I10

## 2021-04-08 LAB — CBC WITH DIFF
BASOPHIL #: 0.04 10*3/uL (ref <=0.20)
BASOPHIL %: 1 %
EOSINOPHIL #: 0.13 10*3/uL (ref <=0.50)
EOSINOPHIL %: 2 %
HCT: 45.6 % (ref 38.9–52.0)
HGB: 15.2 g/dL (ref 13.4–17.5)
IMMATURE GRANULOCYTE #: <0.04 10*3/uL (ref <0.10)
IMMATURE GRANULOCYTE %: 0 % (ref 0–1)
LYMPHOCYTE #: 1.3 10*3/uL (ref 1.00–4.80)
LYMPHOCYTE %: 16 %
MCH: 31.1 pg (ref 26.0–32.0)
MCHC: 33.3 g/dL (ref 31.0–35.5)
MCV: 93.3 fL (ref 78.0–100.0)
MONOCYTE #: 0.57 10*3/uL (ref 0.20–1.10)
MONOCYTE %: 7 %
MPV: 9.6 fL (ref 8.7–12.5)
NEUTROPHIL #: 6.12 10*3/uL (ref 1.50–7.70)
NEUTROPHIL %: 74 %
PLATELETS: 246 10*3/uL (ref 150–400)
RBC: 4.89 10*6/uL (ref 4.50–6.10)
RDW-CV: 12.2 % (ref 11.5–15.5)
WBC: 8.2 10*3/uL (ref 3.7–11.0)

## 2021-04-08 LAB — HEPATIC FUNCTION PANEL
ALBUMIN: 4.3 g/dL (ref 3.5–5.0)
ALKALINE PHOSPHATASE: 62 U/L (ref 45–115)
ALT (SGPT): 21 U/L (ref <55)
AST (SGOT): 17 U/L (ref 8–48)
BILIRUBIN DIRECT: 0.2 mg/dL (ref <0.3)
BILIRUBIN TOTAL: 0.5 mg/dL (ref 0.3–1.3)
PROTEIN TOTAL: 7.1 g/dL (ref 6.4–8.3)

## 2021-04-08 LAB — DRUG SCREEN, NO CONFIRMATION, URINE
AMPHETAMINES URINE: POSITIVE — AB
BARBITURATES URINE: NEGATIVE
BENZODIAZEPINES URINE: NEGATIVE
BUPRENORPHINE URINE: NEGATIVE
CANNABINOIDS URINE: NEGATIVE
COCAINE METABOLITES URINE: NEGATIVE
CREATININE RANDOM URINE: 333 mg/dL
ECSTASY/MDMA URINE: NEGATIVE
FENTANYL, RANDOM URINE: NEGATIVE
METHADONE URINE: NEGATIVE
OPIATES URINE (LOW CUTOFF): NEGATIVE
OXYCODONE URINE: NEGATIVE

## 2021-04-08 LAB — URINALYSIS, MACROSCOPIC
BILIRUBIN: NEGATIVE mg/dL
BLOOD: NEGATIVE mg/dL
COLOR: NORMAL
GLUCOSE: NEGATIVE mg/dL
KETONES: NEGATIVE mg/dL
LEUKOCYTES: NEGATIVE WBCs/uL
NITRITE: NEGATIVE
PH: 6 (ref 5.0–8.0)
PROTEIN: 30 mg/dL — AB
SPECIFIC GRAVITY: 1.025 (ref 1.005–1.030)
UROBILINOGEN: 4 mg/dL — AB

## 2021-04-08 LAB — ECG 12 LEAD
Atrial Rate: 71 {beats}/min
Calculated P Axis: 53 degrees
Calculated R Axis: 39 degrees
Calculated T Axis: 55 degrees
PR Interval: 132 ms
QRS Duration: 106 ms
QT Interval: 352 ms
QTC Calculation: 382 ms
Ventricular rate: 71 {beats}/min

## 2021-04-08 LAB — CREATINE KINASE (CK), TOTAL, SERUM: CREATINE KINASE: 128 U/L (ref 45–225)

## 2021-04-08 LAB — BASIC METABOLIC PANEL
ANION GAP: 8 mmol/L (ref 4–13)
BUN/CREA RATIO: 8 (ref 6–22)
BUN: 10 mg/dL (ref 8–25)
CALCIUM: 9.9 mg/dL (ref 8.5–10.2)
CHLORIDE: 102 mmol/L (ref 96–111)
CO2 TOTAL: 24 mmol/L (ref 22–32)
CREATININE: 1.18 mg/dL (ref 0.62–1.27)
ESTIMATED GFR: >60 mL/min/{1.73_m2} (ref >60)
GLUCOSE: 95 mg/dL (ref 65–125)
POTASSIUM: 3.9 mmol/L (ref 3.5–5.1)
SODIUM: 134 mmol/L — ABNORMAL LOW (ref 136–145)

## 2021-04-08 LAB — URINALYSIS, MICROSCOPIC

## 2021-04-08 LAB — THYROXINE, FREE (FREE T4): THYROXINE (T4), FREE: 0.93 ng/dL (ref 0.70–1.25)

## 2021-04-08 LAB — COVID-19, FLU A/B, RSV RAPID BY PCR
INFLUENZA VIRUS TYPE A: NOT DETECTED
INFLUENZA VIRUS TYPE B: NOT DETECTED
RESPIRATORY SYNCTIAL VIRUS (RSV): NOT DETECTED
SARS-CoV-2: NOT DETECTED

## 2021-04-08 LAB — ETHANOL, SERUM
ETHANOL: <10 mg/dL (ref <10)
ETHANOL: NOT DETECTED

## 2021-04-08 LAB — THYROID STIMULATING HORMONE WITH FREE T4 REFLEX: TSH: 0.208 u[IU]/mL — ABNORMAL LOW (ref 0.350–5.000)

## 2021-04-08 LAB — RED TOP TUBE

## 2021-04-08 MED ORDER — ONDANSETRON 4 MG DISINTEGRATING TABLET
4.0000 mg | ORAL_TABLET | ORAL | Status: AC
Start: 2021-04-08 — End: 2021-04-08
  Administered 2021-04-08: 4 mg via ORAL
  Filled 2021-04-08: qty 1

## 2021-04-08 NOTE — ED Attending Handoff Note (Signed)
04/08/2021  12:58  Turnover from Dr. Rodman Pickle  Med clearance  Arranged detox admission  Non-emergent trasnport arranged  D/c w/QRT    Discussed care at home and reasons to return  All questions sought and answered     Gretta Arab, MD  04/08/2021, 12:58

## 2021-04-08 NOTE — ED Nurses Note (Signed)
Pt resting comfortably in bed. Breathing is regular and non labored. Denies needing anything at this time.

## 2021-04-08 NOTE — Care Management Notes (Addendum)
[facility]  Care Management Note    Patient Name: Cody Horn  Date of Birth: 05/14/1985  Sex: male  Date/Time of Admission: 04/08/2021  4:50 AM  Room/Bed: ER06/ER06  Payor: /    LOS: 0 days   Primary Care Providers:  Pcp, No (General)    Admitting Diagnosis:  No admission diagnoses are documented for this encounter.    Assessment:      04/08/21 0721   CM Complete   Assessment Type ED   Interventions needed? Yes   Chart Reviewed Yes   New post-acute placement Psych/Detox   Pt presented to the ED requesting detox from meth. MSW consulted by ED MD to assist with transferring pt to detox facility. MSW Intern Volanda Napoleon and MD Rodman Pickle spoke to pt (msw intern on phone) to see if pt had a preference on placement, and had none. Pt was also asked if he had insurance, believed he had medicaid but never received a card but stated "[facility] or [place] would know". MSW Intern faxed referral to [facility] [phone]. Handoff secure chatted to MSW Lovena Le at [facility]. CM will continue to follow.    [id number]: Referral made to PCU. MSW [employer] notified.     Discharge Plan:  Drug Rehab/Treatment (code 65)      The patient will continue to be evaluated for developing discharge needs.     Case Manager: Wyatt Haste, [place]  Phone: [phone]

## 2021-04-08 NOTE — ED Nurses Note (Signed)
Spoke with pt in his room. 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 Pt has been accepted to [facility] and [facility] QRT set up for transport. Pt needed to get his belongings from [employer]. Called [facility] and tried to arrange for them to bring his belongings to [place]. They said their transport vehicle was tied up for the day, but they might be able to bring his stuff to [place] later. I gave pt my contact info, and offered him my support.

## 2021-04-08 NOTE — ED Nurses Note (Signed)
[facility]  AVS reviewed with patient.  A written copy of the AVS and discharge instructions was given to the patient.  Questions sufficiently answered as needed.  Patient encouraged to follow up with PCP as indicated.  In the event of an emergency, patient instructed to call 911 or go to the nearest emergency room.

## 2021-04-08 NOTE — ED Provider Notes (Signed)
[facility] Emergency Department  Attending Provider Note    Name: Cody Horn  Age and Gender: 36 y.o. 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 Labs and imaging reviewed.  Cody Horn is a 36 y.o. male       Patient evaluated for medical screening prior to detox placement for substance abuse.  Patient well-appearing on exam with reassuring vital signs.   Labs reassuring   EKG with normal sinus rhythm, no evidence of acute ischemia   Based on the above history, physical exam, and tests obtained as indicated, the Emergency Department evaluation has identified no acute or active medical issues that warrant further emergency department intervention or precludes discharge of the patient or transfer to a psychiatric facility.   Case discussed with care management, will start working on placement    After discussing patient's HPI, ED course and plan, patient was signed out and care transferred to Dr. Lorre Munroe. Please see their course note for further patient care information.  --------------------------------------------------------------------------------------------------------------------------------  HPI:  Cody Horn is a 36 y.o. male  who presents to the ED today for detox evaluation   Patient reports they use meth and desires detox from meth use.  Patient reports that he wants to detox because he received a citation from law enforcement earlier this evening as he was found in possession of methamphetamine.   Primarily smokes the meth but sometimes he injects it   Last use yesterday around 3:00 p.m.    Denies any acute physical concerns.   Denies SI, HI, AVH      History Limitations: None     Review of Systems:  + methamphetamine abuse  -fever, chills, SI, HI  All other  systems reviewed and are negative.    Below information obtained from chart review and/or reviewed with patient:  Past Medical History:   Diagnosis Date   . anxiety    . depression    . HTN (hypertension)          No current outpatient medications on file.     No Known Allergies  History reviewed. No pertinent surgical history.  No family history on file.   Social History     Occupational History   . Not on file   Tobacco Use   . Smoking status: Every Day     Types: Cigarettes   . Smokeless tobacco: Never   Substance and Sexual Activity   . Alcohol use: Not Currently   . Drug use: Yes     Types: Methamphetamines   . Sexual activity: Not on file       Objective:  Nursing notes reviewed  ED Triage Vitals [04/08/21 0453]   BP (Non-Invasive) (!) 159/98   Heart Rate 84   Respiratory Rate 18   Temperature 36 C (96.8 F)   SpO2 100 %   Weight 74.4 kg (164 lb)   Height 1.626 m ('5\' 4"'$ )     Filed Vitals:    04/08/21 0453 04/08/21 0651   BP: (!) 159/98 (!) 146/84   Pulse: 84 72   Resp: 18 16   Temp: 36 C (96.8 F)    SpO2: 100% 100%       Physical Exam  Constitutional:  Pt is well-developed and well-nourished. No acute distress.   Head: Normocephalic and atraumatic.   Eyes: Conjunctivae are normal. Pupils are equal, round, and reactive to light. EOMI.  Neck: Full range of motion, supple.  Cardiac. Distal pulses intact. Normal color.  Regular rate and rhythm.  No murmur.  Pulmonary: No respiratory distress, no accessory muscle use.   Musculoskeletal: Normal range of motion. No deformities.    Neurological: CNs 2-12 grossly intact.  No focal deficits noted.  Skin: No rash or lesions  Psychiatric: Patient has a normal mood and affect.     Labs:   Labs Ordered/Reviewed   BASIC METABOLIC PANEL - Abnormal; Notable for the following components:       Result Value    SODIUM 134 (*)     All other components within normal limits    Narrative:     Estimated Glomerular Filtration Rate (eGFR) is calculated using the CKD-EPI (2021)  equation, intended for patients 38 years of age and older. If gender is not documented or "unknown", there will be no eGFR calculation.   THYROID STIMULATING HORMONE WITH FREE T4 REFLEX - Abnormal; Notable for the following components:    TSH 0.208 (*)     All other components within normal limits   DRUG SCREEN, NO CONFIRMATION, URINE - Abnormal; Notable for the following components:    AMPHETAMINES URINE Positive (*)     All other components within normal limits   URINALYSIS, MACROSCOPIC - Abnormal; Notable for the following components:    PROTEIN 30 (*)     UROBILINOGEN 4.0 (*)     All other components within normal limits    Narrative:     Test did not meet guideline to perform Urine Culture.   URINALYSIS, MICROSCOPIC - Abnormal; Notable for the following components:    MUCOUS Moderate (*)     All other components within normal limits    Narrative:     SPERM SEEN   HEPATIC FUNCTION PANEL - Normal   CREATINE KINASE (CK), TOTAL, SERUM - Normal   COVID-19, FLU A/B, RSV RAPID BY PCR - Normal    Narrative:     Results are for the simultaneous qualitative identification of SARS-CoV-2 (formerly 2019-nCoV), Influenza A, Influenza B, and RSV RNA. These etiologic agents are generally detectable in nasopharyngeal and nasal swabs during the ACUTE PHASE of infection. Hence, this test is intended to be performed on respiratory specimens collected from individuals with signs and symptoms of upper respiratory tract infection who meet Centers for Disease Control and Prevention (CDC) clinical and/or epidemiological criteria for Coronavirus Disease 2019 (COVID-19) testing. CDC COVID-19 criteria for testing on human specimens is available at [facility] webpage information for Healthcare Professionals: Coronavirus Disease 2019 (COVID-19) ([email or web address]).     False-negative results may occur if the virus has genomic mutations, insertions, deletions, or rearrangements or if performed very early in  the course of illness. Otherwise, negative results indicate virus specific RNA targets are not detected, however negative results do not preclude SARS-CoV-2 infection/COVID-19, Influenza, or Respiratory syncytial virus infection. Results should not be used as the sole basis for patient management decisions. Negative results must be combined with clinical observations, patient history, and epidemiological information. If upper respiratory tract infection is still suspected based on exposure history together with other clinical findings, re-testing should be considered.    Disclaimer:   This assay has been authorized by FDA under an Emergency Use Authorization for use in laboratories certified under the Clinical Laboratory Improvement Amendments  of 1988 (CLIA), 42 U.S.C. [phone], to perform high complexity tests. The impacts of vaccines, antiviral therapeutics, antibiotics, chemotherapeutic or immunosuppressant drugs have not been evaluated.     Test methodology:   Cepheid Xpert Xpress SARS-CoV-2/Flu/RSV Assay real-time polymerase chain reaction (RT-PCR) test on the GeneXpert Dx and Xpert Xpress systems.   ETHANOL, SERUM   CBC/DIFF    Narrative:     The following orders were created for panel order CBC/DIFF.  Procedure                               Abnormality         Status                     ---------                               -----------         ------                     CBC WITH JOAC[[id number]]                                    Final result                 Please view results for these tests on the individual orders.   URINALYSIS, MACROSCOPIC AND MICROSCOPIC W/CULTURE REFLEX    Narrative:     The following orders were created for panel order URINALYSIS, MACROSCOPIC AND MICROSCOPIC W/CULTURE REFLEX.  Procedure                               Abnormality         Status                     ---------                               -----------         ------                     URINALYSIS, MACROSCOPIC[[id number]]       Abnormal            Final result               URINALYSIS, MICROSCOPIC[[id number]]      Abnormal            Final result                 Please view results for these tests on the individual orders.   CBC WITH DIFF   EXTRA TUBES    Narrative:     The following orders were created for panel order EXTRA TUBES.  Procedure                               Abnormality         Status                     ---------                               -----------         ------  RED TOP BSWH[[id number]]                                     In process                   Please view results for these tests on the individual orders.   RED TOP TUBE   THYROXINE, FREE (FREE T4)       Imaging:        Orders:  Orders Placed This Encounter   . ETHANOL, SERUM   . BASIC METABOLIC PANEL, NON-FASTING   . CBC/DIFF   . THYROID STIMULATING HORMONE WITH FREE T4 REFLEX   . URINALYSIS, MACROSCOPIC AND MICROSCOPIC W/CULTURE REFLEX   . DRUG SCREEN, LOW OPIATE CUTOFF, NO CONFIRMATION, URINE   . HEPATIC FUNCTION PANEL   . CREATINE KINASE (CK), TOTAL, SERUM   . COVID-19, FLU A/B, RSV RAPID BY PCR   . CBC WITH DIFF   . URINALYSIS, MACROSCOPIC   . URINALYSIS, MICROSCOPIC   . EXTRA TUBES   . RED TOP TUBE   . THYROXINE, FREE (FREE T4)   . ECG 12 LEAD       Junie Bame, MD 04/08/2021, 06:59   [facility] Emergency Medicine     *Parts of this patients chart were completed in a retrospective fashion due to simultaneous direct patient care activities in the Emergency Department.   *This note was partially generated using MModal Fluency Direct system, and there may be some incorrect words, spellings, and punctuation that were not noted in checking the note before saving.

## 2021-04-08 NOTE — ED Nurses Note (Signed)
Report called to [place] at [place].

## 2021-04-08 NOTE — ED Nurses Note (Signed)
Care management at bedside.

## 2021-04-08 NOTE — ED Triage Notes (Signed)
Patient here requesting to go to rehab to detox from meth last used yesterday evening by snorting. Patient has a history of depression.

## 2021-04-08 NOTE — Care Management Notes (Signed)
[facility]  Care Management Note    Patient Name: Cody Horn  Date of Birth: 1985/01/16  Sex: male  Date/Time of Admission: 04/08/2021  4:50 AM  Room/Bed: ER06/ER06  Payor: [place] MEDICAID / Plan: [employer] / Product Type: Medicaid /    LOS: 0 days   Primary Care Providers:  Pcp, No (General)    Admitting Diagnosis:  No admission diagnoses are documented for this encounter.    Assessment:      04/08/21 1113   CM Complete   Assessment Type ED   Interventions needed? Yes   Chart Reviewed Yes   Mental Health Treatment Referral Yes   New post-acute placement Psych/Detox   Patient choice offered to patient/family yes   Form for patient choice reviewed/signed and on chart yes   Facility or [place] [facility]     MSW received handoff referrals were sent to PCU and [place]. MSW spoke with Elmo Putt with PCU they are willing to accept pt. MSW met with pt at bedside with bed offer. Pt agreeable to PCU and [facility] completed. Pt states he needs to get his things from [facility]. MSW informed him will [facility] to see if anyone can bring his belongings. MSW attempted phone contact with their Director and left general message. MSW spoke with registration to verify whether pt had medicaid. Per passport pt does. Insurance information update. MSW left message with Elberta Fortis with Crisis response team to see if they could assist with transport.  Shortly after meeting with pt, Adonis Huguenin with [facility] called and offered pt a bed. MSW met with pt again with new bed offer. Pt opted to go to [place] in hopes someone from [employer] can bring him his belongings. Charge nurse, Museum/gallery conservator was able to secure transport via [place]. MSw spoke with Elmo Putt with PCU and updated her pt would not be coming.     Discharge Plan:  Drug Rehab/Treatment (code 76), Psychiatric Facility (code 75)      The patient will continue to be evaluated for developing discharge needs.     Case Manager: Norton Pastel, [place]  Phone: [phone]

## 2021-04-08 NOTE — ED Nurses Note (Signed)
Pt provided with breakfast tray. Currently sitting up comfortably in bed eating breakfast.

## 2021-04-09 ENCOUNTER — Other Ambulatory Visit: Payer: MEDICAID | Attending: Registered Nurse | Admitting: Registered Nurse

## 2021-04-09 DIAGNOSIS — F1120 Opioid dependence, uncomplicated: Secondary | ICD-10-CM | POA: Insufficient documentation

## 2021-04-09 LAB — DRUG SCREEN, WITH CONFIRMATION, URINE
AMPHETAMINES, URINE: POSITIVE — AB
BARBITURATES URINE: NEGATIVE
BENZODIAZEPINES URINE: NEGATIVE
BUPRENORPHINE URINE: NEGATIVE
CANNABINOIDS URINE: NEGATIVE
COCAINE METABOLITES URINE: NEGATIVE
CREATININE RANDOM URINE: 204 mg/dL — ABNORMAL HIGH (ref 50–100)
ECSTASY/MDMA URINE: NEGATIVE
FENTANYL, RANDOM URINE: NEGATIVE
METHADONE URINE: NEGATIVE
OPIATES URINE (LOW CUTOFF): NEGATIVE
OXYCODONE URINE: NEGATIVE

## 2021-04-10 LAB — COMPREHENSIVE METABOLIC PANEL, NON-FASTING
ALBUMIN: 4.2 g/dL (ref 3.5–5.0)
ALKALINE PHOSPHATASE: 61 U/L (ref 45–115)
ALT (SGPT): 14 U/L (ref 10–55)
ANION GAP: 10 mmol/L (ref 4–13)
AST (SGOT): 14 U/L (ref 8–45)
BILIRUBIN TOTAL: 0.3 mg/dL (ref 0.3–1.3)
BUN/CREA RATIO: 11 (ref 6–22)
BUN: 14 mg/dL (ref 8–25)
CALCIUM: 9.8 mg/dL (ref 8.5–10.0)
CHLORIDE: 102 mmol/L (ref 96–111)
CO2 TOTAL: 25 mmol/L (ref 22–30)
CREATININE: 1.24 mg/dL (ref 0.75–1.35)
ESTIMATED GFR: 78 mL/min/BSA (ref >=60)
GLUCOSE: 83 mg/dL (ref 65–125)
POTASSIUM: 4.4 mmol/L (ref 3.5–5.1)
PROTEIN TOTAL: 7.1 g/dL (ref 6.4–8.3)
SODIUM: 137 mmol/L (ref 136–145)

## 2021-04-10 LAB — HEPATITIS B SURFACE ANTIGEN: HBV SURFACE ANTIGEN QUALITATIVE: NEGATIVE

## 2021-04-10 LAB — LAVENDER TOP TUBE

## 2021-04-10 LAB — HEPATITIS A (HAV) IGG ANTIBODY: HAV IGG ANTIBODY: NEGATIVE

## 2021-04-10 LAB — HEPATITIS A (HAV) IGM ANTIBODY: HAV IGM: NEGATIVE

## 2021-04-10 LAB — HIV1/HIV2 SCREEN, COMBINED ANTIGEN AND ANTIBODY: HIV SCREEN, COMBINED ANTIGEN & ANTIBODY: NEGATIVE

## 2021-04-10 LAB — SYPHILIS SCREENING ALGORITHM WITH REFLEX, SERUM: SYPHILIS TP ANTIBODIES: NONREACTIVE

## 2021-04-10 LAB — THYROID STIMULATING HORMONE WITH FREE T4 REFLEX: TSH: 1.766 u[IU]/mL (ref 0.430–3.550)

## 2021-04-13 LAB — HEPATITIS C VIRUS (HCV) RNA DETECTION AND QUANTIFICATION, PCR, PLASMA: HCV QUANTITATIVE PCR: NOT DETECTED

## 2021-04-15 LAB — DRUG MONITOR,AMPHETAMINE, QN, URINE
AMPHETAMINE: 11000 ng/mL — ABNORMAL HIGH (ref <250)
METHAMPHETAMINE: >15000 ng/mL — ABNORMAL HIGH (ref <250)

## 2021-04-15 LAB — NOTES AND COMMENTS

## 2021-04-22 ENCOUNTER — Other Ambulatory Visit: Payer: Medicaid Other

## 2021-04-22 DIAGNOSIS — Z0489 Encounter for examination and observation for other specified reasons: Secondary | ICD-10-CM | POA: Insufficient documentation

## 2021-04-23 LAB — DRUG SCREEN, WITH CONFIRMATION, URINE
AMPHETAMINES, URINE: NEGATIVE
BARBITURATES URINE: NEGATIVE
BENZODIAZEPINES URINE: NEGATIVE
BUPRENORPHINE URINE: NEGATIVE
CANNABINOIDS URINE: NEGATIVE
COCAINE METABOLITES URINE: NEGATIVE
CREATININE RANDOM URINE: 150 mg/dL — ABNORMAL HIGH (ref 50–100)
ECSTASY/MDMA URINE: NEGATIVE
FENTANYL, RANDOM URINE: NEGATIVE
METHADONE URINE: NEGATIVE
OPIATES URINE (LOW CUTOFF): NEGATIVE
OXYCODONE URINE: NEGATIVE

## 2021-05-15 ENCOUNTER — Other Ambulatory Visit: Payer: Self-pay

## 2021-05-28 ENCOUNTER — Encounter (HOSPITAL_COMMUNITY): Payer: Self-pay | Admitting: PSYCHIATRY & NEUROLOGY, PSYCHIATRY

## 2021-05-28 ENCOUNTER — Inpatient Hospital Stay (HOSPITAL_COMMUNITY): Payer: Medicaid Other | Admitting: PSYCHIATRY & NEUROLOGY, PSYCHIATRY

## 2021-05-28 ENCOUNTER — Inpatient Hospital Stay
Admission: RE | Admit: 2021-05-28 | Discharge: 2021-06-02 | DRG: 885 | Disposition: A | Discharge status: Home / Self Care | Payer: Medicaid Other | Attending: PSYCHIATRY & NEUROLOGY, PSYCHIATRY | Admitting: PSYCHIATRY & NEUROLOGY, PSYCHIATRY

## 2021-05-28 ENCOUNTER — Encounter (HOSPITAL_COMMUNITY): Payer: Self-pay

## 2021-05-28 ENCOUNTER — Other Ambulatory Visit: Payer: Self-pay

## 2021-05-28 DIAGNOSIS — R441 Visual hallucinations: Secondary | ICD-10-CM | POA: Diagnosis present

## 2021-05-28 DIAGNOSIS — Z79899 Other long term (current) drug therapy: Secondary | ICD-10-CM

## 2021-05-28 DIAGNOSIS — Z20822 Contact with and (suspected) exposure to covid-19: Secondary | ICD-10-CM | POA: Diagnosis present

## 2021-05-28 DIAGNOSIS — R45851 Suicidal ideations: Secondary | ICD-10-CM | POA: Diagnosis present

## 2021-05-28 DIAGNOSIS — Z638 Other specified problems related to primary support group: Secondary | ICD-10-CM

## 2021-05-28 DIAGNOSIS — R451 Restlessness and agitation: Secondary | ICD-10-CM

## 2021-05-28 DIAGNOSIS — X58XXXA Exposure to other specified factors, initial encounter: Secondary | ICD-10-CM | POA: Diagnosis present

## 2021-05-28 DIAGNOSIS — S91106A Unspecified open wound of unspecified lesser toe(s) without damage to nail, initial encounter: Secondary | ICD-10-CM | POA: Diagnosis present

## 2021-05-28 DIAGNOSIS — F1411 Cocaine abuse, in remission: Secondary | ICD-10-CM | POA: Diagnosis present

## 2021-05-28 DIAGNOSIS — B353 Tinea pedis: Secondary | ICD-10-CM | POA: Diagnosis present

## 2021-05-28 DIAGNOSIS — F1511 Other stimulant abuse, in remission: Secondary | ICD-10-CM | POA: Diagnosis present

## 2021-05-28 DIAGNOSIS — F319 Bipolar disorder, unspecified: Principal | ICD-10-CM | POA: Diagnosis present

## 2021-05-28 DIAGNOSIS — F32A Depression, unspecified: Secondary | ICD-10-CM

## 2021-05-28 DIAGNOSIS — Z8659 Personal history of other mental and behavioral disorders: Secondary | ICD-10-CM

## 2021-05-28 DIAGNOSIS — F17210 Nicotine dependence, cigarettes, uncomplicated: Secondary | ICD-10-CM | POA: Diagnosis present

## 2021-05-28 DIAGNOSIS — F4320 Adjustment disorder, unspecified: Secondary | ICD-10-CM | POA: Diagnosis present

## 2021-05-28 DIAGNOSIS — Z6830 Body mass index (BMI) 30.0-30.9, adult: Secondary | ICD-10-CM | POA: Diagnosis not present

## 2021-05-28 HISTORY — DX: Personal history of other mental and behavioral disorders: Z86.59

## 2021-05-28 HISTORY — DX: Irritability and anger: R45.4

## 2021-05-28 LAB — URINALYSIS, MACRO/MICRO
BILIRUBIN: NEGATIVE mg/dL
BLOOD: NEGATIVE mg/dL
COLOR: NORMAL
GLUCOSE: NORMAL mg/dL
KETONES: NEGATIVE mg/dL
LEUKOCYTES: NEGATIVE WBCs/uL
NITRITE: NEGATIVE
PH: 6.5 (ref 5.0–8.0)
PROTEIN: NEGATIVE mg/dL
SPECIFIC GRAVITY: 1.022 (ref 1.005–1.030)
UROBILINOGEN: 2 mg/dL — AB

## 2021-05-28 LAB — DRUG SCREEN, NO CONFIRMATION, URINE
AMPHETAMINES, URINE: NEGATIVE
BARBITURATES URINE: NEGATIVE
BENZODIAZEPINES URINE: NEGATIVE
BUPRENORPHINE URINE: NEGATIVE
CANNABINOIDS URINE: NEGATIVE
COCAINE METABOLITES URINE: NEGATIVE
CREATININE RANDOM URINE: 93 mg/dL (ref 50–100)
ECSTASY/MDMA URINE: NEGATIVE
FENTANYL, RANDOM URINE: NEGATIVE
METHADONE URINE: NEGATIVE
OPIATES URINE (LOW CUTOFF): NEGATIVE
OXYCODONE URINE: NEGATIVE

## 2021-05-28 LAB — CBC WITH DIFF
BASOPHIL #: <0.1 10*3/uL (ref <=0.20)
BASOPHIL %: 1 %
EOSINOPHIL #: 0.19 10*3/uL (ref <=0.50)
EOSINOPHIL %: 3 %
HCT: 42.9 % (ref 38.9–52.0)
HGB: 14.1 g/dL (ref 13.4–17.5)
IMMATURE GRANULOCYTE #: <0.1 10*3/uL (ref <0.10)
IMMATURE GRANULOCYTE %: 0 % (ref 0–1)
LYMPHOCYTE #: 1.44 10*3/uL (ref 1.00–4.80)
LYMPHOCYTE %: 19 %
MCH: 31.2 pg (ref 26.0–32.0)
MCHC: 32.9 g/dL (ref 31.0–35.5)
MCV: 94.9 fL (ref 78.0–100.0)
MONOCYTE #: 0.64 10*3/uL (ref 0.20–1.10)
MONOCYTE %: 9 %
MPV: 9.5 fL (ref 8.7–12.5)
NEUTROPHIL #: 5.17 10*3/uL (ref 1.50–7.70)
NEUTROPHIL %: 68 %
PLATELETS: 214 10*3/uL (ref 150–400)
RBC: 4.52 10*6/uL (ref 4.50–6.10)
RDW-CV: 13 % (ref 11.5–15.5)
WBC: 7.5 10*3/uL (ref 3.7–11.0)

## 2021-05-28 LAB — ETHANOL, SERUM
ETHANOL: <10 mg/dL (ref <10)
ETHANOL: NOT DETECTED

## 2021-05-28 LAB — BASIC METABOLIC PANEL
ANION GAP: 12 mmol/L (ref 4–13)
BUN/CREA RATIO: 16 (ref 6–22)
BUN: 19 mg/dL (ref 8–25)
CALCIUM: 9.4 mg/dL (ref 8.5–10.0)
CHLORIDE: 107 mmol/L (ref 96–111)
CO2 TOTAL: 19 mmol/L — ABNORMAL LOW (ref 22–30)
CREATININE: 1.19 mg/dL (ref 0.75–1.35)
ESTIMATED GFR: 82 mL/min/BSA (ref >=60)
GLUCOSE: 100 mg/dL (ref 65–125)
POTASSIUM: 4.2 mmol/L (ref 3.5–5.1)
SODIUM: 138 mmol/L (ref 136–145)

## 2021-05-28 LAB — COVID-19, FLU A/B, RSV RAPID BY PCR
INFLUENZA VIRUS TYPE A: NOT DETECTED
INFLUENZA VIRUS TYPE B: NOT DETECTED
RESPIRATORY SYNCTIAL VIRUS (RSV): NOT DETECTED
SARS-CoV-2: NOT DETECTED

## 2021-05-28 LAB — HEPATIC FUNCTION PANEL
ALBUMIN: 3.9 g/dL (ref 3.5–5.0)
ALKALINE PHOSPHATASE: 73 U/L (ref 45–115)
ALT (SGPT): 20 U/L (ref 10–55)
AST (SGOT): 22 U/L (ref 8–45)
BILIRUBIN DIRECT: <0.1 mg/dL — ABNORMAL LOW (ref 0.1–0.4)
BILIRUBIN TOTAL: 0.2 mg/dL — ABNORMAL LOW (ref 0.3–1.3)
PROTEIN TOTAL: 7 g/dL (ref 6.4–8.3)

## 2021-05-28 LAB — THYROID STIMULATING HORMONE WITH FREE T4 REFLEX: TSH: 3.32 u[IU]/mL (ref 0.430–3.550)

## 2021-05-28 MED ORDER — NICOTINE (POLACRILEX) 2 MG BUCCAL LOZENGE
2.0000 mg | LOZENGE | BUCCAL | Status: DC | PRN
Start: 2021-05-28 — End: 2021-06-02

## 2021-05-28 MED ORDER — FOLIC ACID 1 MG TABLET
1.0000 mg | ORAL_TABLET | Freq: Every day | ORAL | Status: DC
Start: 2021-05-29 — End: 2021-06-02
  Administered 2021-05-29 – 2021-06-02 (×5): 1 mg via ORAL
  Filled 2021-05-28 (×5): qty 1

## 2021-05-28 MED ORDER — OLANZAPINE 5 MG DISINTEGRATING TABLET
5.0000 mg | ORAL_TABLET | ORAL | Status: DC | PRN
Start: 2021-05-28 — End: 2021-06-02

## 2021-05-28 MED ORDER — NICOTINE 21 MG/24 HR DAILY TRANSDERMAL PATCH
21.0000 mg | MEDICATED_PATCH | Freq: Every day | TRANSDERMAL | Status: DC
Start: 2021-05-29 — End: 2021-06-02
  Administered 2021-05-29 – 2021-05-30 (×2): 0 mg via TRANSDERMAL
  Administered 2021-05-31: 21 mg via TRANSDERMAL
  Administered 2021-06-01 – 2021-06-02 (×2): 0 mg via TRANSDERMAL
  Filled 2021-05-28 (×2): qty 1

## 2021-05-28 MED ORDER — ACETAMINOPHEN 325 MG TABLET
650.0000 mg | ORAL_TABLET | ORAL | Status: DC | PRN
Start: 2021-05-28 — End: 2021-06-02

## 2021-05-28 MED ORDER — MAGNESIUM HYDROXIDE 400 MG/5 ML ORAL SUSPENSION
30.0000 mL | ORAL | Status: DC | PRN
Start: 2021-05-28 — End: 2021-06-02

## 2021-05-28 MED ORDER — IBUPROFEN 600 MG TABLET
600.0000 mg | ORAL_TABLET | Freq: Four times a day (QID) | ORAL | Status: DC | PRN
Start: 2021-05-28 — End: 2021-06-02

## 2021-05-28 MED ORDER — LORATADINE 10 MG TABLET
10.0000 mg | ORAL_TABLET | Freq: Every day | ORAL | Status: DC
Start: 2021-05-29 — End: 2021-06-02
  Administered 2021-05-29 – 2021-06-02 (×5): 10 mg via ORAL
  Filled 2021-05-28 (×5): qty 1

## 2021-05-28 MED ORDER — HYDROXYZINE PAMOATE 50 MG CAPSULE
50.0000 mg | ORAL_CAPSULE | Freq: Every evening | ORAL | Status: DC | PRN
Start: 2021-05-28 — End: 2021-06-02
  Administered 2021-05-28: 50 mg via ORAL
  Filled 2021-05-28: qty 1

## 2021-05-28 MED ORDER — ALUMINUM-MAG HYDROXIDE-SIMETHICONE 200 MG-200 MG-20 MG/5 ML ORAL SUSP
30.0000 mL | ORAL | Status: DC | PRN
Start: 2021-05-28 — End: 2021-06-02

## 2021-05-28 MED ORDER — PANTOPRAZOLE 40 MG TABLET,DELAYED RELEASE
40.0000 mg | DELAYED_RELEASE_TABLET | Freq: Every morning | ORAL | Status: DC
Start: 2021-05-29 — End: 2021-05-29
  Administered 2021-05-29: 40 mg via ORAL
  Filled 2021-05-28: qty 1

## 2021-05-28 NOTE — ED Nurses Note (Signed)
Bagged lunch and drink provided.

## 2021-05-28 NOTE — ED Nurses Note (Addendum)
Report called to [facility] RN. RN stated that she has not received consent paperwork and needs it refaxed before pt can go up.

## 2021-05-28 NOTE — ED Nurses Note (Signed)
Sam, RN refaxed paperwork to [facility].

## 2021-05-28 NOTE — ED Provider Notes (Signed)
Department of Emergency Medicine  HPI - 05/28/2021    Advanced Practice Provider: Kirke ShaggyKatelynn Verlon Pischke, PA-C  Attending Physician: Dr. Delford FieldWright    Chief Complaint: Suicidal Thoughts    HPI  Cody Horn is a 36 y.o. male who presents to the ED via EMS with c/o suicidal thoughts. Patient complains of SI ongoing for years however notes his symptoms have been worsening over the last couple of days. He reports his symptoms, "come in waves," noting a plan of jumping off of a bridge today. Patient endorses VH and believes he has a h/o schizophrenia however he has never been formally diagnosed with schizophrenia. He follows with a Psychiatrist regularly and is prescribed Zyprexa however he believes he needs this medication adjusted.    Patient denies any current illicit substance use stating he is 2 months sober from Methamphetamine and cocaine.    Patient denies any AH, HI, and any other complaints or concerns at this time.     History Limitation: None    Review of Systems  All systems reviewed and are negative unless otherwise specified in HPI.    History:   PMH:    Past Medical History:   Diagnosis Date   . anxiety    . depression    . HTN (hypertension)          PSH:  No past surgical history pertinent negatives on file.      Social Hx:    Social History     Socioeconomic History   . Marital status: Single     Spouse name: Not on file   . Number of children: Not on file   . Years of education: Not on file   . Highest education level: Not on file   Occupational History   . Not on file   Tobacco Use   . Smoking status: Every Day     Types: Cigarettes   . Smokeless tobacco: Never   Substance and Sexual Activity   . Alcohol use: Not Currently   . Drug use: Yes     Types: Methamphetamines   . Sexual activity: Not on file   Other Topics Concern   . Not on file   Social History Narrative   . Not on file     Social Determinants of Health     Financial Resource Strain: Not on file   Transportation Needs: Not on file   Social  Connections: Not on file   Intimate Partner Violence: Not on file   Housing Stability: Not on file     Family Hx: No family history on file.  Allergies: No Known Allergies  Medications:   None       Above history reviewed with patient, changes are as documented.    Physical Exam   Nursing notes reviewed.    Filed Vitals:    05/28/21 1430   BP: (!) 147/87   Pulse: 75   Resp: 17   Temp: 36.4 C (97.5 F)   SpO2: 96%      Constitutional: NAD. Oriented  HENT:   Head: Normocephalic and atraumatic.   Mouth/Throat: Oropharynx is clear and moist.   Eyes: EOMI, PERRL   Neck: Trachea midline. Neck supple.  Cardiovascular: Regular rate and rhythm. No murmurs, rubs or gallops. Intact distal pulses.  Pulmonary/Chest: BS equal bilaterally. No respiratory distress. No wheezes, rales or chest tenderness.   GI: BS +. Abdomen soft, no tenderness, rebound or guarding.     Musculoskeletal: No edema, tenderness or  deformity.  Skin: Warm and dry. No rash, erythema, pallor or cyanosis  Psychiatric: Normal mood and affect. Behavior is normal.   Neurological: Alert. Grossly intact.    Course  Orders, Abnormal Labs and Imaging Results:  Results up to the Time the Disposition was Entered   BASIC METABOLIC PANEL - Abnormal; Notable for the following components:       Result Value    CO2 TOTAL 19 (*)     All other components within normal limits   HEPATIC FUNCTION PANEL - Abnormal; Notable for the following components:    BILIRUBIN TOTAL 0.2 (*)     BILIRUBIN DIRECT <0.1 (*)     All other components within normal limits   URINALYSIS, MACRO/MICRO - Abnormal; Notable for the following components:    UROBILINOGEN 2 (*)     All other components within normal limits   THYROID STIMULATING HORMONE WITH FREE T4 REFLEX - Normal   DRUG SCREEN, NO CONFIRMATION, URINE - Normal   COVID-19, FLU A/B, RSV RAPID BY PCR - Normal    Narrative:     Results are for the simultaneous qualitative identification of SARS-CoV-2 (formerly 2019-nCoV), Influenza A,  Influenza B, and RSV RNA. These etiologic agents are generally detectable in nasopharyngeal and nasal swabs during the ACUTE PHASE of infection. Hence, this test is intended to be performed on respiratory specimens collected from individuals with signs and symptoms of upper respiratory tract infection who meet Centers for Disease Control and Prevention (CDC) clinical and/or epidemiological criteria for Coronavirus Disease 2019 (COVID-19) testing. CDC COVID-19 criteria for testing on human specimens is available at [facility] webpage information for Healthcare Professionals: Coronavirus Disease 2019 (COVID-19) ([email or web address]).     False-negative results may occur if the virus has genomic mutations, insertions, deletions, or rearrangements or if performed very early in the course of illness. Otherwise, negative results indicate virus specific RNA targets are not detected, however negative results do not preclude SARS-CoV-2 infection/COVID-19, Influenza, or Respiratory syncytial virus infection. Results should not be used as the sole basis for patient management decisions. Negative results must be combined with clinical observations, patient history, and epidemiological information. If upper respiratory tract infection is still suspected based on exposure history together with other clinical findings, re-testing should be considered.    Disclaimer:   This assay has been authorized by FDA under an Emergency Use Authorization for use in laboratories certified under the Clinical Laboratory Improvement Amendments of 1988 (CLIA), 42 U.S.C. [phone], to perform high complexity tests. The impacts of vaccines, antiviral therapeutics, antibiotics, chemotherapeutic or immunosuppressant drugs have not been evaluated.     Test methodology:   Cepheid Xpert Xpress SARS-CoV-2/Flu/RSV Assay real-time polymerase chain reaction (RT-PCR) test on the GeneXpert Dx and Xpert Xpress systems.   CBC/DIFF     Narrative:     The following orders were created for panel order CBC/DIFF.  Procedure                               Abnormality         Status                     ---------                               -----------         ------  CBC WITH WYOV[[id number]]                                    Final result                 Please view results for these tests on the individual orders.   ETHANOL, SERUM   URINALYSIS WITH REFLEX MICROSCOPIC AND CULTURE IF POSITIVE    Narrative:     The following orders were created for panel order URINALYSIS WITH REFLEX MICROSCOPIC AND CULTURE IF POSITIVE.  Procedure                               Abnormality         Status                     ---------                               -----------         ------                     URINALYSIS, MACRO/MICRO[[id number]]      Abnormal            Final result                 Please view results for these tests on the individual orders.   ECG 12 LEAD - ED USE    Narrative:     Normal sinus rhythm  Normal ECG  No previous ECGs available   CBC WITH DIFF       EKG: Reviewed by Attending.   MDM:   ED Course as of 05/28/21 2034   Thu May 28, 2021   1547 WBC: 7.5   1629 TSH: 3.320   1629 BASIC METABOLIC PANEL(!)  Reviewed     1629 COVID-19, FLU A/B, RSV RAPID BY PCR  Reviewed and unremarakble   1629 HEPATIC FUNCTION PANEL(!)  Reviewed    1629 ETHANOL, SERUM: <10   1728 URINALYSIS WITH REFLEX MICROSCOPIC AND CULTURE IF POSITIVE(!)  Reviewed    1753 DRUG SCREEN, NO CONFIRMATION, URINE  Reviewed    1754 1754: attempted to contact Dr. Terrilee Files regarding admission to [facility], no answer  1850: Spoke to Dr. Terrilee Files, accepted admission to [facility]     Discussed admission in detail with patient, he is agreeable    Plan to admit      Medical Decision Making  Amount and/or Complexity of Data Reviewed  Labs: ordered.  ECG/medicine tests: ordered and independent interpretation performed.      Risk  Decision regarding hospitalization.        54}  Risk Factors:     Agitation, Depression, sadness, Major depression and Self-harm (cutting, burning, etc)  Protective Factors:    positive therapeutic relationships, sobriety, expressive of needs/emotions, insight into illness/situation, motivated for treatment  Suicide Inquiry:    Thoughts:   ideation with a plan--jumping from height   Plans/Behaviors/Past attempts:   threat of jumping from height  Intent:   Patient expressed intent to harm self with definite plan.    Risk Level:   HIGH RISK = This is because patient has psychiatric diagnoses with severe symptoms, acute precipitating event, and/or potentially lethal suicide attempt or persistent  ideation with strong intent or suicide rehearsal.  54}  Based on my assessment, the patient is a high risk of suicide     Intervention:     HIGH RISK: Patient has been found to be HIGH RISK for suicide.  Admission generally indicated unless a significant change reduces risk. Suicide precautions initiated including 1:1 observation until transferred to psychiatric unit/facility or risk is downgraded, removal of potential lethal objects and personal belongings from the room until transfer, and other measures advised by psych.    Documentation  Given the above risk level the following plan of care was developed:     Admission is required due to high risk SI.    1:1 observation was required due to high risk SI.   Medication management - unchanged.    Behavioral health referral was offered and was accepted by the patient.   Patient counseled by provider on Suicide prevention and provided with written information, crisis phone numbers, and resources.   Patient follow up is today with Dr. Terrilee Files, [facility].    Consults:  Psychiatrist Dr. Terrilee Files consulted, agreeable to admit patient to [facility].  Impression:   Clinical Impression   Suicidal ideation (Primary)       Disposition:  Admitted  Admission: Patient will be admitted to Dr. Katherina Right service for further evaluation and management of SI with plan.         I am scribing for, and in the presence of Kirke Shaggy, [place] for services provided on 05/28/2021.  Exie Parody, SCRIBE  05/28/2021, 14:49      I personally performed the services described in this documentation, as scribed  in my presence, and it is both accurate  and complete.  Kirke Shaggy, PA-C  [place], PA-C        The co-signing faculty was physically present in the emergency department and available for consultation and did not participate in the care of this patient.

## 2021-05-28 NOTE — Nurses Notes (Signed)
Rec'd report from [place], Charity fundraiser. We still do not have consents that were sent at 1915. She will have them re-faxed and we will call 1931 when they arrive so patient can be sent to [facility].

## 2021-05-28 NOTE — ED Nurses Note (Signed)
Pt changed into gown and all personal belongings placed in labeled belongings bag outside pt room within 1:1 eyesight. Pt sitting in bed, breathing even and unlabored on room air. Pt shows no s/sx of distress. Side rails up. Bed in lowest position. Security at bedside for 1:1.

## 2021-05-28 NOTE — Care Management Notes (Signed)
[facility] papers explained to patient.  No smoking and no cell phone policy explained.  Papers signed and faxed to [facility].  Sherilyn Cooter, CASE MANAGER

## 2021-05-28 NOTE — Nurses Notes (Signed)
Pt arrived on unit via WC.   BHU protocol & safety checks initiated upon arrival.  Per Hospital standards regarding Covid 19 protective measures patient has been advised to maintain social distancing of 6 feet from staff or peers. Frequent handwashing or hand sanitizer use encouraged. Pt was instructed that Hand sanitizer is available at multiple sites on the unit. Verbal evidence of understanding given of teaching. Face mask refused.

## 2021-05-28 NOTE — Nurses Notes (Signed)
Cody Horn is a 36yr old male that was admitted to [facility] for suicidal ideations with a plan to jump off a bridge. He reports multiple SAs in the past. He said by jumping in front of a vehicle & hanging himself. He reported that he was admitted to medical unit after hanging himself. He reported feeling SI off & on since 2007. This is his 1st psych admit in [place]. He is from [facility] & reported multiple admits in [facility] in [place], [place]. He said that he came here to be with his GF in 1/23, but she has since ended their relationship so he is currently homeless & living at the [facility]. He reported that he wants to go back to [facility] when he can get it arranged. He reports VH at times, sees shadows & sometimes thinks he's experiencing premonitions. Denies SI/HI/AH/VH upon admission. Cooperative with admission process. Care plan initiated. Oriented to unit. Safety checks maintained.

## 2021-05-28 NOTE — ED Nurses Note (Signed)
Pt transported to 7 center via wheelchair by Judeth Cornfield, PCT and security. Breathing even and unlabored on room air. All personal belongings with pt. GCS15. Aox3. No s/s of distress. No change in pt status prior to transport.

## 2021-05-29 DIAGNOSIS — F4320 Adjustment disorder, unspecified: Secondary | ICD-10-CM

## 2021-05-29 DIAGNOSIS — Z6830 Body mass index (BMI) 30.0-30.9, adult: Secondary | ICD-10-CM

## 2021-05-29 DIAGNOSIS — Z0189 Encounter for other specified special examinations: Secondary | ICD-10-CM

## 2021-05-29 DIAGNOSIS — R45851 Suicidal ideations: Secondary | ICD-10-CM

## 2021-05-29 DIAGNOSIS — F319 Bipolar disorder, unspecified: Principal | ICD-10-CM

## 2021-05-29 LAB — ECG 12 LEAD - ED USE
Atrial Rate: 67 {beats}/min
Calculated P Axis: -23 degrees
Calculated R Axis: 35 degrees
Calculated T Axis: 57 degrees
PR Interval: 138 ms
QRS Duration: 104 ms
QT Interval: 382 ms
QTC Calculation: 403 ms
Ventricular rate: 67 {beats}/min

## 2021-05-29 MED ORDER — NICOTINE 21 MG/24 HR DAILY TRANSDERMAL PATCH
21.0000 mg | MEDICATED_PATCH | Freq: Every day | TRANSDERMAL | Status: DC
Start: 2021-05-29 — End: 2021-06-02
  Administered 2021-05-29 – 2021-05-30 (×2): 0 mg via TRANSDERMAL
  Administered 2021-05-31: 21 mg via TRANSDERMAL
  Administered 2021-06-01 – 2021-06-02 (×2): 0 mg via TRANSDERMAL
  Filled 2021-05-29: qty 1

## 2021-05-29 MED ORDER — OLANZAPINE 7.5 MG TABLET
15.0000 mg | ORAL_TABLET | Freq: Every evening | ORAL | Status: DC
Start: 2021-05-29 — End: 2021-05-29

## 2021-05-29 MED ORDER — OLANZAPINE 5 MG DISINTEGRATING TABLET
15.0000 mg | ORAL_TABLET | Freq: Every evening | ORAL | Status: DC
Start: 2021-05-29 — End: 2021-06-02
  Administered 2021-05-29 – 2021-06-01 (×4): 15 mg via ORAL
  Filled 2021-05-29 (×4): qty 1

## 2021-05-29 MED ORDER — LAMOTRIGINE 25 MG TABLET
25.0000 mg | ORAL_TABLET | Freq: Every day | ORAL | Status: DC
Start: 2021-05-30 — End: 2021-05-30
  Administered 2021-05-30: 25 mg via ORAL
  Filled 2021-05-29: qty 1

## 2021-05-29 MED ORDER — NEOMYCIN-BACITRACN ZN-POLYMYX 3.5 MG-400 UNIT-5,000 UNIT/GRAM TOP OINT
TOPICAL_OINTMENT | Freq: Two times a day (BID) | CUTANEOUS | Status: DC | PRN
Start: 2021-05-29 — End: 2021-06-02
  Filled 2021-05-29: qty 28.4

## 2021-05-29 MED ORDER — OLANZAPINE 10 MG DISINTEGRATING TABLET
10.0000 mg | ORAL_TABLET | Freq: Every evening | ORAL | Status: DC
Start: 2021-05-29 — End: 2021-05-29

## 2021-05-29 MED ORDER — PANTOPRAZOLE 40 MG TABLET,DELAYED RELEASE
40.0000 mg | DELAYED_RELEASE_TABLET | Freq: Every day | ORAL | Status: DC
Start: 2021-05-30 — End: 2021-06-02
  Administered 2021-05-30 – 2021-06-02 (×4): 40 mg via ORAL
  Filled 2021-05-29 (×4): qty 1

## 2021-05-29 MED ORDER — NICOTINE (POLACRILEX) 2 MG BUCCAL LOZENGE
2.0000 mg | LOZENGE | BUCCAL | Status: DC | PRN
Start: 2021-05-29 — End: 2021-06-02

## 2021-05-29 NOTE — Care Plan (Signed)
Cody Horn has been resting since after admission process. He's been quiet, pleasant, & cooperative. No SI/HI/AH/VH voiced. Compliant with tx plan. No SCH HS meds at this time, PRN Vistaril given for sleep per MD order. Safety checks maintained. He appeared to sleep well. Per Hospital standards regarding Covid 19 protective measures patient has been advised to maintain social distancing of 6 feet from staff or peers. Frequent handwashing or hand sanitizer use encouraged. Pt was instructed that Hand sanitizer is available at multiple sites on the unit. Verbal evidence of understanding given of teaching. Face mask refused.

## 2021-05-29 NOTE — Care Management Notes (Signed)
Patient says he will be discharging to [facility]. Patient says he will need [facility]. Patient requesting to follow up with Dr. Juliette Alcide at [facility] for psych med management. Patient declined therapy services referral at this time due to being a participant in the [facility].     [facility]  Care Management Initial Evaluation    Patient Name: Cody Horn  Date of Birth: Feb 16, 1985  Sex: male  Date/Time of Admission: 05/28/2021  2:41 PM  Room/Bed: 7512/A  Payor: [place] MEDICAID / Plan: [place]la [place]aw [place] MEDICAID / Product Type: Medicaid MC /   PCP: No Pcp    Pharmacy Info:   Preferred Pharmacy       [facility] - [place], [place] [id number]    [address] [place] [address]    Phone: [phone] Fax: [phone]    Hours: Not open 24 hours          Emergency Contact Info:   No emergency contact information on file.    History:   Cody Horn is a 36 y.o., male, admitted 05/28/2021.    Height/Weight: 162.6 cm (5\' 4" ) / 81.5 kg (179 lb 9.6 oz)     LOS: 1 day   Admitting Diagnosis: Depression with suicidal ideation [F32.A, R45.851]    Assessment:      05/29/21 1326   Assessment Details   Assessment Type Admission   Date of Care Management Update 05/29/21   Readmission   Is this a readmission? No   Insurance Information/Type   Insurance type Medicaid  (Unicare (Medicaid))   Employment/Financial   Patient has Prescription Coverage?  Yes        Name of Insurance Coverage for Medications Unicare (Medicaid)   Financial Concerns other (see comments)  (Homeless but is going back to the 05/31/21.)   Living Environment   Lives With other (see comments)  (Homeless but is going back to the [employer].)   Living Arrangements *homeless  (Homeless but is going back to the [employer].)   Able to Return to Prior Arrangements yes    Home Safety   Home Assessment:   (Patient is homeless and has no transportation. Patient said he will need Mod[facility]care ride back to [facility].)   Home Accessibility other (see comments)  (Homeless)   Home Safety Comments None reported   Custody and Legal Status   Do you have a court appointed guardian/conservator?   (NOnt reported)   Legal Comment Denies any current or pending legal issues.   Custody Issues?   (None reported)   Paternity Affidavit Requested?   (None requested)   Care Management Plan   Discharge Planning Status initial meeting   Projected Discharge Date 06/04/21   Discharge plan discussed with: Patient   CM will evaluate for rehabilitation potential   (Patient requesting to f/u with Dr 06/06/21 for psych med management. Pt. says he will be completing the [facility] and will not need therapy services at this time.)   Patient choice offered to patient/family   (Patient requesting to f/u with [facility] for psych med management. Pt. says he will be completing the [facility] and will not need therapy services at this time.)   Form for patient choice reviewed/signed and on chart   (N/A)   Facility or Agency Preferences Patient requesting to f/u with Dr [facility] for  psych med management. Pt. says he will be completing the [facility] and will not need therapy services at this time.   Discharge Needs Assessment   Outpatient/Agency/Support Group Needs outpatient psychiatric care;other (see comments)  (Patient will f/u with Dr. Juliette Alcide for psych med management and [facility] at [facility]. Patient declined therapy appointment due to participating in the [facility].)   CPS/CYS/APS Communication   (None reported)   [employer] Referrals:  Other(see comments)  (N/A)   Equipment Currently Used at Home none   Equipment Needed After Discharge none   Discharge Facility/Level of Care Needs   (Patient says he is  returning back to [facility].)   Transportation Available other (see comments)  (Patient say he will need Modivcare to transport to [facility].)   Referral Information   Admission Type inpatient   Address Verified verified-updates forward to PFS  (Called [facility]ley-Admissions at EXT: [phone] and left CVM to update phone number and address to [facility].)   Arrived From admitted as an inpatient   ADVANCE DIRECTIVES   Does the Patient have an Advance Directive?   (Staff noted that information was offered and given.)   Does the Patient have a Psychiatric Advance Directive?   (Staff noted "no".)   Patient Requests Assistance in Having Advance Directive Notarized.   (Staff noted "N/A")         Discharge Plan:  Drug Rehab/Treatment (code 71)  Patient to be discharged to [facility].     The patient will continue to be evaluated for developing discharge needs.     Case Manager: Quin Hoop, CASE MANAGER  Phone: [phone]

## 2021-05-29 NOTE — Care Plan (Signed)
[facility]  THERAPIST PROGRESS NOTE    Cody Horn   Medical Record #: [id number]  Date of Birth: November 17, 1985  Date of Service: 05/29/2021  Total time: 30 min.    CHIEF COMPLAINT:  Chief Complaint   Patient presents with    Suicidal Thoughts      Pt reports SI, wanted to jump off a bridge earlier today. Issues with anxiety and depression and wants his meds changed.        SUBJECTIVE:  Completed psychosocial history assessment, BPRS, and reviewed safety plan. See psychosocial note and flowsheet for information, safety plan can be found on paper chart once completed. Will continue to monitor patient closely for thoughts of suicide, homicide and side effects of treatment.    ASSESSMENT:      ICD-10-CM    1. Suicidal ideation  R45.851             PLAN: Will continue to monitor and support.     INTERDISCIPLINARY TREATMENT TEAM MEETING  PARTICIPANTS:  Medical Staff: Juliette Alcide MD, Psychiatrist.  Nursing Staff: Dale Durham, RN.  Therapy Staff: Jennette Banker, Licensed Independent Clinical Social Worker.    Care plan reviewed with all participants above present.     DISPOSITION:   Discharge plan: Tentative plan is for patient to be discharged to [facility], date to be determined. Follow up with [facility]. See post discharge instructions for details.    Case management needs: aftercare/discharge planning       Ardelle Lesches, MSW, LICSW  Inpatient Therapist    05/29/2021  17:48    [facility]  [address]  [place], [place] [address]  [phone]
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
## 2021-05-29 NOTE — Nurses Notes (Signed)
Patient c/o cracks on bottom of toes, right foot. Reports some pain. Refuses anything for "slight pain". Reports he gets these cracks frequently. Provider notified. Neosporin ordered. Will continue to monitor. Reported to next shift.

## 2021-05-29 NOTE — Care Plan (Signed)
Patient in hallway during initial rounds. Reports continued depression. Compliant with therapeutic milieu. Interacts with peers. Compliant with medication and meals. Denies SI, HI at this time. Contracts for safety. Per Hospital standards regarding Covid 19 protective measures patient has been advised to maintain social distancing of 6 feet from staff or peers. Frequent handwashing or hand sanitizer use encouraged. Pt was instructed that Hand sanitizer is available at multiple sites on the unit. Verbal evidence of understanding given of teaching. Face mask refused.   Safety rounds continue.

## 2021-05-29 NOTE — H&P (Signed)
_  PSYCHIATRY_    HISTORY AND PHYSICAL    Cody, Horn, 36 y.o. male  Date of Admission:  05/28/2021  Date of Birth:  1986-01-04    Information Obtained from: patient  Chief Complaint:  "Wanted to jump off a bridge"    HPI:  As per ER report; " Cody Horn is a 36 y.o., White male who presents with c/o suicidal thoughts. Patient complains of SI ongoing for years however notes his symptoms have been worsening over the last couple of days. He reports his symptoms, "come in waves," noting a plan of jumping off of a bridge today. Patient endorses VH and believes he has a h/o schizophrenia however he has never been formally diagnosed with schizophrenia. He follows with a Psychiatrist regularly and is prescribed Zyprexa however he believes he needs this medication adjusted."  The patient blamed his suicidal ideation to the stressors related to his daughter who he is unable to see.  Her mother has a joint custody of her.  He seems to be overwhelmed with this issue.  He is unable to sleep at night.  Complained of racing thoughts.    Past Medical History:   Diagnosis Date   . Anger    . anxiety    . depression    . History of ADHD    . HTN (hypertension)          Past Surgical History:   Procedure Laterality Date   . HX HERNIA REPAIR           Medications Prior to Admission     Prescriptions    FLUoxetine (PROZAC) 40 mg Oral Capsule    Take 1 Capsule (40 mg total) by mouth Once a day    lamoTRIgine (LAMICTAL) 25 mg Oral Tablet    Take 1 Tablet (25 mg total) by mouth Once a day    loratadine (CLARITIN) 10 mg Oral Tablet    Take 1 Tablet (10 mg total) by mouth Once a day    OLANZapine (ZYPREXA) 10 mg Oral Tablet    Take 1 Tablet (10 mg total) by mouth Every night    pantoprazole (PROTONIX) 40 mg Oral Tablet, Delayed Release (E.C.)    Take 1 Tablet (40 mg total) by mouth Once a day        No Known Allergies  Social History     Tobacco Use   . Smoking status: Every Day     Packs/day: 1.00     Years: 17.00     Pack years:  17.00     Types: Cigarettes   . Smokeless tobacco: Former     Types: Snuff   Substance Use Topics   . Alcohol use: Not Currently     Comment: not for 2 months         ROS:     EXAM:  Temperature: 35.7 C (96.3 F)  Heart Rate: 76  BP (Non-Invasive): (!) 140/100  Respiratory Rate: 16  SpO2: 97 %  Filed Vitals:    05/28/21 1430 05/28/21 2027 05/29/21 0636   BP: (!) 147/87 136/89 (!) 140/100   Pulse: 75 71 76   Resp: 17 18 16    Temp: 36.4 C (97.5 F) 36.5 C (97.7 F) 35.7 C (96.3 F)   SpO2: 96% 98% 97%       Psychiatric:  Affect:  Labile, Behavior:  Cooperative, Memory:  Within normal limit, Thought content:  Preoccupied about his suicidal thoughts as well as meeting with the daughter,  Judgement:  Poor, Speech:  Somewhat pressured    LABS referred:     ASSESSMENT/PRINCIPAL PROBLEM AXIS I:  Bipolar disorder.  Adjustment disorder with suicidal ideation.    Active Hospital Problems    Diagnosis   . Primary Problem: Depression with suicidal ideation       PLAN:  We shall discontinue the Prozac and will increase the Zyprexa to 15 mg at bedtime.  Patient was provided with lots of reassurance and redirection.        Dr. Newell Coral  [facility], [facility]

## 2021-05-29 NOTE — Group Note (Signed)
Group topic:  PROCESS THERAPY    Date of group:  05/29/2021  Start time of group:  1120  End time of group:  1200               Summary of group discussion: Group discussed daily agendas, short/medium/long-term goal setting, and discharge planning. Reviewed breathing exercises and practiced diaphragmatic breathing. Participants shared and provided feedback to one another.         Cody Horn  is a 36 y.o. male participating in a process group.    Affect/Mood:  Depressed and Irritable    Thought Process:  Concrete    Thought Content:  Negative    Interpersonal:  Discussed issues, Attentive, Provided feedback and Hostile    Level of participation:  Full    Comments:    Marin Roberts, CT  05/29/2021, 14:55

## 2021-05-30 MED ORDER — LAMOTRIGINE 25 MG TABLET
25.0000 mg | ORAL_TABLET | Freq: Two times a day (BID) | ORAL | Status: DC
Start: 2021-05-30 — End: 2021-06-02
  Administered 2021-05-30 – 2021-06-02 (×6): 25 mg via ORAL
  Filled 2021-05-30 (×6): qty 1

## 2021-05-30 MED ORDER — CLOTRIMAZOLE 1 % TOPICAL CREAM
TOPICAL_CREAM | Freq: Two times a day (BID) | CUTANEOUS | Status: DC
Start: 2021-05-30 — End: 2021-06-02
  Administered 2021-06-02: 1 via TOPICAL
  Filled 2021-05-30: qty 14.2

## 2021-05-30 NOTE — Care Plan (Signed)
Patient was isolated to his room this shift. Patient did not socialize with peers. Patient was up for snack and medication. Patient was flat, anxious and depressed. Patient denies AH/VH/SI/HI Safety checks maintained. Patient appeared to sleep well.  Per Hospital standards regarding Covid 19 protective measures patient has been advised to maintain social distancing of 6 feet from staff or peers. Frequent handwashing or hand sanitizer use encouraged. Pt was instructed that Hand sanitizer is available at multiple sites on the unit. Verbal evidence of understanding given of teaching. Face mask refused.

## 2021-05-30 NOTE — Care Plan (Signed)
Patient denies wish for harm to self or other. His affect is restricted. Insight and judgement appropriate to situation. Denies any hallucinations. Increase Lamictal to 25 mg twice daily. Lotrimin for fungal infection to feet. Cracks on bottom of toes treated with Lotrimin and Band Aids applied. Patient asks if he can be discharged on Monday, 06/01/2021 rather than Wednesday. He denies any symptoms of illness. Wishes to get back to the [facility]. Reports he is anxious to begin the plan he has made for going forward in his life. Per Hospital standards regarding Covid 19 protective measures patient has been advised to maintain social distancing of 6 feet from staff or peers. Frequent handwashing or hand sanitizer use encouraged. Pt was instructed that Hand sanitizer is available at multiple sites on the unit. Verbal evidence of understanding given of teaching. Face mask refused.   Safety rounds continue.

## 2021-05-30 NOTE — Progress Notes (Signed)
[facility] HEALTH  PSYCHIATRY PROGRESS NOTE    Cody Horn   Medical Record #: [id number]  Date of Birth: 14-Aug-1985  Date of Service: 05/30/2021      SUBJECTIVE:  Patient seen and examined. Treatment was discussed with staff. Patient reported that he is doing better and still focusing on a lot of stress with his "baby mama".  He has a child wants to see and cannot.  He was discharged from the ACT unit on 05/13/21 [facility].  He has foot fungal infection and a small wound linear wound to back of fifth toe, no drainage.    Appetite:  Sleep:    OBJECTIVE:   Orientation: person, place, time and situation   Appearance: casually dressed   Mood: depressed.     Affect: congruent to mood.     Thought process: circumstantial   Insight: poor   Judgment: fair   Current Suicidal Thoughts: No   Homicidal Ideation:  None.    Behavior:  Cooperative.     Auditory or visual hallucinations:  None.     ASSESSMENT:   PRINCIPAL PROBLEM AXIS I:    Bipolar disorder.  Adjustment disorder with suicidal ideation.  Active Hospital Problems    Diagnosis   . Primary Problem: Depression with suicidal ideation       PLAN:  Supportive care; group and individual therapeutic interventions.  Compliance emphasized.    Medication changes:   Lotrimin for his feet.  His Zyprexa was increased to 15 mg and I will bump up the Lamictal 25 mg BID.   Discharge Planning:   05/25    Earlie Raveling, FNP-BC     Dr. Juliette Alcide  [facility], [place] [facility]

## 2021-05-31 ENCOUNTER — Encounter (HOSPITAL_COMMUNITY): Payer: Self-pay

## 2021-05-31 MED ORDER — PERPHENAZINE 2 MG TABLET
4.0000 mg | ORAL_TABLET | Freq: Two times a day (BID) | ORAL | Status: DC
Start: 2021-05-31 — End: 2021-06-02
  Administered 2021-05-31 – 2021-06-02 (×4): 4 mg via ORAL
  Filled 2021-05-31 (×3): qty 2
  Filled 2021-05-31: qty 1
  Filled 2021-05-31: qty 2

## 2021-05-31 NOTE — Group Note (Signed)
Group topic:  PROCESS THERAPY    Date of group:  05/30/2021  Start time of group:  1430  End time of group:  1600               Summary of group discussion:      Cody Horn  is a 36 y.o. male participating in a process group. This therapist combined groups today agenda and crafts with casual discussion.    Affect/Mood:  Appropriate    Thought Process:  Goal directed    Thought Content:  Within normal limits    Interpersonal:  Discussed issues and Attentive    Level of participation:  Full    Comments:    Alfredo Batty, CLINICAL THERAPIST  05/31/2021, 11:21

## 2021-05-31 NOTE — Group Note (Signed)
Group topic:  PROCESS THERAPY    Date of group:  05/31/2021  Start time of group:  1400  End time of group:  1430            Cody Horn  is a 36 y.o. male participating in a process group. Group discussed the importance of developing a safety plan prior to discharge. Group members and this therapist discussed safety plan in detail and the patient's were provided a safety plan to begin work on in group, safety plan and aftercare plans were discussed as a group as each patient felt comfortable sharing . Cody Horn shared with peers and verbalized an understanding of all. Cody Horn can be very loud in expressing himself and needs redirection to calm down.           Currie Paris, CLINICAL THERAPIST  05/31/2021, 15:27

## 2021-05-31 NOTE — Care Plan (Signed)
Patient in hallway during initial rounds. Reports continued depression of a "slight" nature. Compliant with therapeutic milieu. Interacts with peers. Compliant with medication and meals. Has requested to leave tomorrow, 5/22/2. Denies SI, HI at this time. [facility] [place] with [facility]". Trilafon ordered for mania and psychosis. Contracts for safety.Per Hospital standards regarding Covid 19 protective measures patient has been advised to maintain social distancing of 6 feet from staff or peers. Frequent handwashing or hand sanitizer use encouraged. Pt was instructed that Hand sanitizer is available at multiple sites on the unit. Verbal evidence of understanding given of teaching. Face mask refused.   Safety rounds continue.

## 2021-05-31 NOTE — Care Plan (Signed)
Patient was up on the unit socializing with peers. Patient was up for snack and medication. Patient was labile, anxious and depressed. Patient said he was in a good mood and had a good day. Patient denies AH/VH/SI/HI Safety checks maintained. Patient appeared to sleep well.  Per Hospital standards regarding Covid 19 protective measures patient has been advised to maintain social distancing of 6 feet from staff or peers. Frequent handwashing or hand sanitizer use encouraged. Pt was instructed that Hand sanitizer is available at multiple sites on the unit. Verbal evidence of understanding given of teaching. Face mask refused.

## 2021-05-31 NOTE — Progress Notes (Signed)
[facility] HEALTH  PSYCHIATRY PROGRESS NOTE    Cody Horn   Medical Record #: [id number]  Date of Birth: 18-Jan-1985  Date of Service: 05/31/2021      SUBJECTIVE:  Patient seen and examined. Treatment was discussed with staff. Patient reported that he is very hypertoday and indeed he is very manic and pressured, acting bizarre.   Appetite:  Sleep:    OBJECTIVE:   Orientation: person, place, time and situation   Appearance: casually dressed   Mood:  Hyper.    Affect: congruent to mood.     Thought process: circumstantial   Insight: poor   Judgment: fair   Current Suicidal Thoughts: No   Homicidal Ideation:  None.    Behavior:  Cooperative.     Auditory or visual hallucinations:  None.     ASSESSMENT:   PRINCIPAL PROBLEM AXIS I:    Bipolar disorder.  Adjustment disorder with suicidal ideation.  Active Hospital Problems    Diagnosis   . Primary Problem: Depression with suicidal ideation       PLAN:  Supportive care; group and individual therapeutic interventions.  Compliance emphasized.    Medication changes:   Trilafon 4 mg. Bid. [place].   Discharge Planning:   05/25    Earlie Raveling, FNP-BC     Dr. Juliette Alcide  [facility], [place] [facility]

## 2021-05-31 NOTE — Group Note (Signed)
Group topic:  PROCESS THERAPY    Date of group:  05/31/2021  Start time of group:  0930  End time of group:  1010               Summary of group discussion:      Cody Horn  is a 36 y.o. male participating in a process group. Group discussed daily agendas, long term goals and aftercare plans. Group members provided feedback to one another.  Cody Horn's agenda focused on wanting to be discharged tomorrow. Cody Horn reports he feels better on his medcation and is ready for discharge.     Affect/Mood:  Appropriate    Thought Process:  Goal directed    Thought Content:  Within normal limits    Interpersonal:  Attentive    Level of participation:  Full    Comments:    Currie Paris, CLINICAL THERAPIST  05/31/2021, 11:39

## 2021-05-31 NOTE — Care Plan (Signed)
Problem: Adult Behavioral Health Plan of Care  Goal: Plan of Care Review  Outcome: Ongoing (see interventions/notes)      Met this date with Legrand Como to complete plan of care review. All in agreement to continue working on current goals/objectives, next review in 4 days - 06/04/2021. Will continue to monitor/assess.      Goal: Patient-Specific Goal (Individualization)  Outcome: Ongoing (see interventions/notes)  Goal: Strengths and Vulnerabilities  Outcome: Ongoing (see interventions/notes)  Flowsheets (Taken 05/31/2021 1553)  Patient Personal Strengths:   community support   expressive of needs   interests/hobbies   motivated for treatment   motivated for recovery  Patient Vulnerabilities:   adverse childhood experience(s)   family/relationship conflict   limited social skills   substance abuse/addiction  Goal: Adheres to Safety Considerations for Self and Others  Outcome: Ongoing (see interventions/notes)  Goal: Absence of New-Onset Illness or Injury  Outcome: Ongoing (see interventions/notes)  Goal: Optimized Coping Skills in Response to Life Stressors  Outcome: Ongoing (see interventions/notes)  Goal: Develops/Participates in Therapeutic Alliance to Support Successful Transition  Outcome: Ongoing (see interventions/notes)  Goal: Rounds/Family Conference  Outcome: Ongoing (see interventions/notes)  Flowsheets (Taken 05/31/2021 1553)  Summary: See therapy staffing note     Problem: Depressive Signs/Symptoms  Goal: Improved Mood Symptoms (Depressive Signs/Symptoms)  Outcome: Ongoing (see interventions/notes)  Flowsheets (Taken 05/31/2021 1553)  Mutually Determined Action Steps (Improved Mood Symptoms): identifies personal treatment goal     Problem: Adult Treatment Plan  Goal: Patient will meet daily with attending physician.  Description: Physicians will evaluate patient, discuss diagnosis, prescribe medications and order diagnostic testing as clinically indicated. Physicians will monitor safety risks and assess when  appropriate for discharge.  Outcome: Ongoing (see interventions/notes)  Flowsheets (Taken 05/31/2021 1553)  Patient met daily with the attending physician.: Yes  Goal: Patient will meet individually daily with Clinical Therapist.  Outcome: Ongoing (see interventions/notes)  Flowsheets (Taken 05/31/2021 1553)  Patient met individually daily with Clinical Therapist.: Yes     Problem: Adult Treatment Plan  Goal: Patient will participate in 50% of therapy groups facilitated by Clinical Therapist.  Description: Topics will include Dialectical Behavior Therapy with the components of Mindfulness, Distress Tolerance, Emotion Regulation and Crisis Survival Skills, as well as Stress Management skills (DBT not applicable at [facility]).  Outcome: Ongoing (see interventions/notes)

## 2021-06-01 MED ORDER — OLANZAPINE 15 MG DISINTEGRATING TABLET
15.0000 mg | ORAL_TABLET | Freq: Every evening | ORAL | 0 refills | Status: DC
Start: 2021-06-01 — End: 2021-06-22

## 2021-06-01 MED ORDER — NEOMYCIN-BACITRACN ZN-POLYMYX 3.5 MG-400 UNIT-5,000 UNIT/GRAM TOP OINT
TOPICAL_OINTMENT | Freq: Two times a day (BID) | CUTANEOUS | 0 refills | Status: AC | PRN
Start: 2021-06-01 — End: 2021-07-01

## 2021-06-01 MED ORDER — LORATADINE 10 MG TABLET
10.0000 mg | ORAL_TABLET | Freq: Every day | ORAL | 0 refills | Status: DC
Start: 2021-06-01 — End: 2021-07-20

## 2021-06-01 MED ORDER — PANTOPRAZOLE 40 MG TABLET,DELAYED RELEASE
40.0000 mg | DELAYED_RELEASE_TABLET | Freq: Every day | ORAL | 0 refills | Status: DC
Start: 2021-06-01 — End: 2021-06-22

## 2021-06-01 MED ORDER — FOLIC ACID 1 MG TABLET
1.0000 mg | ORAL_TABLET | Freq: Every day | ORAL | 0 refills | Status: DC
Start: 2021-06-02 — End: 2021-06-22

## 2021-06-01 MED ORDER — LAMOTRIGINE 25 MG TABLET
25.0000 mg | ORAL_TABLET | Freq: Two times a day (BID) | ORAL | 0 refills | Status: DC
Start: 2021-06-01 — End: 2021-07-10

## 2021-06-01 MED ORDER — PERPHENAZINE 4 MG TABLET
4.0000 mg | ORAL_TABLET | Freq: Two times a day (BID) | ORAL | 0 refills | Status: AC
Start: 2021-06-02 — End: 2021-07-02

## 2021-06-01 MED ORDER — CLOTRIMAZOLE 1 % TOPICAL CREAM
TOPICAL_CREAM | Freq: Two times a day (BID) | CUTANEOUS | 0 refills | Status: AC
Start: 2021-06-01 — End: 2021-07-01

## 2021-06-01 NOTE — Care Plan (Signed)
Cody Horn has been up and about on the unit. He is cooperative and compliant with medications. He does attend group therapy sessions. He was asking for information on his medications this morning. He was provided a copy of his medications and explained what they are used for. He thinks that he is being discharged tomorrow. No harmful thoughts voiced today. Per Hospital standards regarding Covid 19 protective measures patient has been advised to maintain social distancing of 6 feet from staff or peers. Frequent handwashing or hand sanitizer use encouraged. Pt was instructed that Hand sanitizer is available at multiple sites on the unit. Verbal evidence of understanding given of teaching. Face mask refused.   15 min safety checks maintained.

## 2021-06-01 NOTE — Care Plan (Signed)
Patient was up on the unit socializing with peers. Patient was up for snack and medication. Patient denied AH/VH/SI/HI. Patient was labile, anxious and depressed. Safety checks maintained. Patient appeared to sleep well.  Per Hospital standards regarding Covid 19 protective measures patient has been advised to maintain social distancing of 6 feet from staff or peers. Frequent handwashing or hand sanitizer use encouraged. Pt was instructed that Hand sanitizer is available at multiple sites on the unit. Verbal evidence of understanding given of teaching. Face mask refused.

## 2021-06-01 NOTE — Progress Notes (Signed)
_    [facility] NOTE    [place] Record #: [id number]  Date of Birth: 09/09/1985  Date of Service: 06/01/2021      SUBJECTIVE:  Patient seen and examined. Treatment was discussed with staff. Patient reported that he is doing well today and [facility]  His hyperness and mania responded well to Trilafon.   Appetite:  Sleep:    OBJECTIVE:   Orientation: person, place, time and situation   Appearance: casually dressed   Mood:  Good today.    Affect: congruent to mood.     Thought process: circumstantial   Insight: poor   Judgment: fair   Current Suicidal Thoughts: No   Homicidal Ideation:  None.    Behavior:  Cooperative.     Auditory or visual hallucinations:  None.     ASSESSMENT:   PRINCIPAL PROBLEM AXIS I:    Bipolar disorder.  Adjustment disorder with suicidal ideation.  Active Hospital Problems    Diagnosis   . Primary Problem: Depression with suicidal ideation       PLAN:  Supportive care; group and individual therapeutic interventions.  Compliance emphasized.    Medication changes:   No changes.   Discharge Planning:   05/23    Benard Rink, FNP-BC     Dr. Newell Coral  [facility], [place] [facility]

## 2021-06-02 NOTE — Group Note (Signed)
Group topic:  PROCESS THERAPY    Date of group:  06/01/2021  Start time of group:  1110  End time of group:  1150               Summary of group discussion: Group discussed daily agendas, short/medium/long-term goal setting, and discharge planning. Participants shared and provided feedback to one another.        Cody Horn  is a 36 y.o. male participating in a process group.    Affect/Mood:  Appropriate    Thought Process:  Logical and Goal directed    Thought Content:  Within normal limits    Interpersonal:  Discussed issues, Attentive, Displayed insight and Provided feedback    Level of participation:  Full        Dia Sitter, MSW,LGSW  06/02/2021, 11:08

## 2021-06-02 NOTE — Nurses Notes (Signed)
Pt discharged to home. Transportation provided by [employer]. Belongings inventoried and returned to pt. Medications called into Pharmacy of choice. Discharge paperwork printed, reviewed and provided to pt. Pt denies SI/HI.

## 2021-06-02 NOTE — Care Plan (Signed)
Cody Horn was very cooperative with staff and social with peers. [name] spent most of the evening in the day room watching TV with peers. Denied SI/HI/AH/VH. [name]'s affect was bright and cheerful and he is focused on discharge. He did eat a snack and was medication compliant. No concerns/complaints voiced. [name] slept well. No behaviors noted.  Per Hospital standards regarding Covid,   Patient has been advised to maintain social distancing of 6 feet from staff or peers.   Frequent handwashing or hand sanitizer use encouraged.   Pt was instructed that Hand sanitizer is available at multiple sites on the unit.   Face mask was offered to the patient.

## 2021-06-02 NOTE — Discharge Instructions (Signed)
Kolter Reaver has a follow up appointment scheduled for Tuesday, Jun 09, 2021 at 10:45 AM with Dr. Terrilee Files. His office is located in [address] of [address] [facility] located next to [facility]. Please contact his office at [phone] with any questions or concerns.

## 2021-06-22 ENCOUNTER — Ambulatory Visit (INDEPENDENT_AMBULATORY_CARE_PROVIDER_SITE_OTHER): Payer: Medicaid Other | Admitting: Internal Medicine

## 2021-06-22 ENCOUNTER — Encounter (INDEPENDENT_AMBULATORY_CARE_PROVIDER_SITE_OTHER): Payer: Self-pay | Admitting: Internal Medicine

## 2021-06-22 ENCOUNTER — Other Ambulatory Visit: Payer: Self-pay

## 2021-06-22 VITALS — BP 121/75 | HR 77 | Temp 98.1°F | Resp 16 | Ht 64.02 in | Wt 188.0 lb

## 2021-06-22 DIAGNOSIS — M25512 Pain in left shoulder: Secondary | ICD-10-CM

## 2021-06-22 DIAGNOSIS — Z6832 Body mass index (BMI) 32.0-32.9, adult: Secondary | ICD-10-CM

## 2021-06-22 DIAGNOSIS — Z7689 Persons encountering health services in other specified circumstances: Secondary | ICD-10-CM

## 2021-06-22 DIAGNOSIS — F1910 Other psychoactive substance abuse, uncomplicated: Secondary | ICD-10-CM

## 2021-06-22 MED ORDER — FOLIC ACID 1 MG TABLET
1.0000 mg | ORAL_TABLET | Freq: Every day | ORAL | 3 refills | Status: AC
Start: 2021-06-22 — End: 2021-07-22

## 2021-06-22 MED ORDER — PANTOPRAZOLE 40 MG TABLET,DELAYED RELEASE
40.0000 mg | DELAYED_RELEASE_TABLET | Freq: Every day | ORAL | 3 refills | Status: DC
Start: 2021-06-22 — End: 2021-07-23

## 2021-06-22 NOTE — H&P (Signed)
[facility]  [address]  [place] [address]    History and Physical     Name: Cody Horn MRN:  [id number]   Date: 06/22/2021 Age: 36 y.o.       Chief Complaint: Establish Care (New patient )    History of Present Illness   [facility] Hx of substance abuse since Middle School. Recent hospitalization for Suicide attempt.  With drug use, pt was schizophrenic with auditory and visual hallucinations Has been "clean" for 3 months of both drugs and alcohol.  Injured left shoulder some years ago, never saw an Orthopedist. Still with significant pain with elevation and frontal abduction.  Pt tells me he has been diagnosed with ADHD and was treated with Ritalin. He is not on any medication for his ADHD and he has problems with attention and focus. Would appreciate if there was treatment. I thought of Strattera, which is not controlled. There is low risk of complications with his Zyprexa. Spoke with Dr. Katherina Right office to see of the Zyprexa could be substituted with Abilify    The history is provided by the patient. No language interpreter was used.       There are no problems to display for this patient.    Past Medical History:   Diagnosis Date   . Anger    . anxiety    . depression    . History of ADHD    . HTN (hypertension)          Past Surgical History:   Procedure Laterality Date   . HX HERNIA REPAIR           Current Outpatient Medications   Medication Sig   . clotrimazole (LOTRIMIN) 1 % Cream Apply topically Twice daily for 30 days   . folic acid (FOLVITE) 1 mg Oral Tablet Take 1 Tablet (1 mg total) by mouth Once a day for 30 days   . lamoTRIgine (LAMICTAL) 25 mg Oral Tablet Take 1 Tablet (25 mg total) by mouth Twice daily for 30 days   . loratadine (CLARITIN) 10 mg Oral Tablet Take 1 Tablet (10 mg total) by mouth Once a day for 30 days   . NARCAN 4 mg/actuation Nasal Spray, Non-Aerosol    . neomycin-bacitracin-polymyxin (NEOSPORIN) 3.5mg -400 unit- 5,000  unit/gram Ointment Apply topically Twice per day as needed for Other (cracks to bottom of feet/toes. Infection prevention.) for up to 30 days   . OLANZapine (ZYPREXA) 10 mg Oral Tablet Take 10 mg by mouth Every night   . pantoprazole (PROTONIX) 40 mg Oral Tablet, Delayed Release (E.C.) Take 1 Tablet (40 mg total) by mouth Once a day for 30 days   . perphenazine (TRILAFON) 4 mg Oral Tablet Take 1 Tablet (4 mg total) by mouth Twice daily for 30 days     No Known Allergies  Family Medical History:     Problem Relation (Age of Onset)    ADHD/ADD Brother    Bipolar Disorder Paternal Uncle    Diabetes Father, Paternal Uncle    Hypertension (High Blood Pressure) Mother, Brother          Social History     Tobacco Use   . Smoking status: Every Day     Packs/day: 0.25     Years: 17.00     Pack years: 4.25     Types: Cigarettes   . Smokeless tobacco: Former     Types: Snuff   Substance Use  Topics   . Alcohol use: Not Currently     Comment: not for 3 months      Review of Systems  Review of Systems   Constitutional: Positive for appetite change.   HENT: Negative.    Eyes:        WEARS CORRECTIVE LENSES   Respiratory:        VAPING WOULD LIKE TO STOP SMOKING  , BEEN SMOKING 17 YEARS   Cardiovascular: Negative.    Gastrointestinal:        HX OF GERD   Endocrine: Negative.    Genitourinary: Negative.    Musculoskeletal:        LEFT SHOULDER PAIN   Allergic/Immunologic: Negative.    Neurological: Negative.    Hematological: Negative.    Psychiatric/Behavioral: Positive for behavioral problems.        HX OF ADHD  WAS ON MEDICATION     Examination:  BP 121/75 (Site: Left, Patient Position: Sitting, Cuff Size: Adult)   Pulse 77   Temp 36.7 C (98.1 F) (Thermal Scan)   Resp 16   Ht 1.626 m (5' 4.02")   Wt 85.3 kg (188 lb)   SpO2 96%   BMI 32.25 kg/m       Physical Exam  Constitutional:       Appearance: Normal appearance.   HENT:      Head: Normocephalic.      Right Ear: Tympanic membrane, ear canal and external ear normal.       Left Ear: Tympanic membrane, ear canal and external ear normal.      Nose: Nose normal.      Mouth/Throat:      Mouth: Mucous membranes are moist.   Eyes:      Extraocular Movements: Extraocular movements intact.      Conjunctiva/sclera: Conjunctivae normal.      Comments: Wears corrective lenses   Cardiovascular:      Rate and Rhythm: Normal rate and regular rhythm.      Pulses: Normal pulses.   Pulmonary:      Effort: Pulmonary effort is normal.      Breath sounds: Normal breath sounds.   Abdominal:      Palpations: Abdomen is soft.   Musculoskeletal:      Cervical back: Normal range of motion and neck supple.      Comments: Full range of motion left shoulder, but pain with forward extension   Skin:     General: Skin is warm.      Findings: Erythema present.   Neurological:      General: No focal deficit present.      Mental Status: He is alert and oriented to person, place, and time.   Psychiatric:         Mood and Affect: Mood normal.     Impression     Hx of substance abuse                       Left shoulder pain from injury                       ADHD                       Hx of suicide attempt    [facility]  [facility] and labs  Suggest Strattera  Sees Psych  Follow up  Assessment and Plan  Problem List Items Addressed This Visit    None  Visit Diagnoses     Encounter to establish care    -  Primary    Substance abuse (CMS HCC)        Left shoulder pain        Relevant Orders    XR SHOULDER LEFT           Dorice LamasSusan W Estus Krakowski, MD

## 2021-06-29 ENCOUNTER — Ambulatory Visit (HOSPITAL_COMMUNITY)
Admission: RE | Admit: 2021-06-29 | Discharge: 2021-06-29 | Disposition: A | Discharge status: Home / Self Care | Payer: Medicaid Other | Source: Ambulatory Visit | Attending: Internal Medicine | Admitting: Internal Medicine

## 2021-06-29 ENCOUNTER — Other Ambulatory Visit: Payer: Self-pay

## 2021-06-29 ENCOUNTER — Ambulatory Visit: Payer: Medicaid Other | Attending: Internal Medicine

## 2021-06-29 DIAGNOSIS — M25512 Pain in left shoulder: Secondary | ICD-10-CM | POA: Insufficient documentation

## 2021-06-29 DIAGNOSIS — Z7689 Persons encountering health services in other specified circumstances: Secondary | ICD-10-CM | POA: Insufficient documentation

## 2021-06-29 LAB — HEPATITIS A (HAV) IGG ANTIBODY: HAV IGG ANTIBODY: NEGATIVE

## 2021-06-29 LAB — CBC WITH DIFF
BASOPHIL #: <0.1 10*3/uL (ref <=0.20)
BASOPHIL %: 1 %
EOSINOPHIL #: 0.17 10*3/uL (ref <=0.50)
EOSINOPHIL %: 3 %
HCT: 43.3 % (ref 38.9–52.0)
HGB: 14.4 g/dL (ref 13.4–17.5)
IMMATURE GRANULOCYTE #: <0.1 10*3/uL (ref <0.10)
IMMATURE GRANULOCYTE %: 0 % (ref 0–1)
LYMPHOCYTE #: 1.58 10*3/uL (ref 1.00–4.80)
LYMPHOCYTE %: 25 %
MCH: 30.5 pg (ref 26.0–32.0)
MCHC: 33.3 g/dL (ref 31.0–35.5)
MCV: 91.7 fL (ref 78.0–100.0)
MONOCYTE #: 0.7 10*3/uL (ref 0.20–1.10)
MONOCYTE %: 11 %
MPV: 9.3 fL (ref 8.7–12.5)
NEUTROPHIL #: 3.79 10*3/uL (ref 1.50–7.70)
NEUTROPHIL %: 60 %
PLATELETS: 236 10*3/uL (ref 150–400)
RBC: 4.72 10*6/uL (ref 4.50–6.10)
RDW-CV: 12.8 % (ref 11.5–15.5)
WBC: 6.3 10*3/uL (ref 3.7–11.0)

## 2021-06-29 LAB — COMPREHENSIVE METABOLIC PANEL, NON-FASTING
ALBUMIN: 3.9 g/dL (ref 3.5–5.0)
ALKALINE PHOSPHATASE: 75 U/L (ref 45–115)
ALT (SGPT): 39 U/L (ref 10–55)
ANION GAP: 10 mmol/L (ref 4–13)
AST (SGOT): 23 U/L (ref 8–45)
BILIRUBIN TOTAL: 0.3 mg/dL (ref 0.3–1.3)
BUN/CREA RATIO: 12 (ref 6–22)
BUN: 13 mg/dL (ref 8–25)
CALCIUM: 9.7 mg/dL (ref 8.5–10.0)
CHLORIDE: 106 mmol/L (ref 96–111)
CO2 TOTAL: 23 mmol/L (ref 22–30)
CREATININE: 1.1 mg/dL (ref 0.75–1.35)
ESTIMATED GFR: 90 mL/min/BSA (ref >=60)
GLUCOSE: 112 mg/dL (ref 65–125)
POTASSIUM: 4.2 mmol/L (ref 3.5–5.1)
PROTEIN TOTAL: 7 g/dL (ref 6.4–8.3)
SODIUM: 139 mmol/L (ref 136–145)

## 2021-06-29 LAB — PHOSPHORUS: PHOSPHORUS: 4.3 mg/dL (ref 2.4–4.7)

## 2021-06-29 LAB — MAGNESIUM: MAGNESIUM: 2 mg/dL (ref 1.8–2.6)

## 2021-06-29 LAB — HEPATITIS C ANTIBODY SCREEN WITH REFLEX TO HCV PCR: HCV ANTIBODY QUALITATIVE: NEGATIVE

## 2021-06-29 LAB — HEPATITIS B SURFACE ANTIGEN: HBV SURFACE ANTIGEN QUALITATIVE: NEGATIVE

## 2021-06-29 LAB — LIPID PANEL
CHOL/HDL RATIO: 6.2
CHOLESTEROL: 203 mg/dL — ABNORMAL HIGH (ref 100–200)
HDL CHOL: 33 mg/dL — ABNORMAL LOW (ref >=50)
NON-HDL: 170 mg/dL (ref <=190)
TRIGLYCERIDES: 544 mg/dL — ABNORMAL HIGH (ref <150)

## 2021-06-29 LAB — HEPATITIS B CORE IGM, AB: HBV CORE IGM ANTIBODY QUALITATIVE: NEGATIVE

## 2021-06-29 LAB — VITAMIN B12: VITAMIN B 12: 413 pg/mL (ref 200–900)

## 2021-06-29 LAB — HEPATITIS B CORE ANTIBODY: HBV CORE TOTAL ANTIBODIES: NEGATIVE

## 2021-06-29 LAB — HEPATITIS B SURFACE ANTIBODY: HBV SURFACE ANTIBODY QUANTITATIVE: 524 m[IU]/mL — ABNORMAL HIGH (ref <8)

## 2021-06-29 LAB — HEPATITIS A (HAV) IGM ANTIBODY: HAV IGM: NEGATIVE

## 2021-06-29 LAB — VITAMIN D 25 TOTAL: VITAMIN D 25, TOTAL: 27.9 ng/mL — ABNORMAL LOW (ref 30.0–100.0)

## 2021-06-29 LAB — LDL CHOLESTEROL, DIRECT: LDL DIRECT: 124 mg/dL — ABNORMAL HIGH (ref <100)

## 2021-06-29 LAB — HIV1/HIV2 SCREEN, COMBINED ANTIGEN AND ANTIBODY: HIV SCREEN, COMBINED ANTIGEN & ANTIBODY: NEGATIVE

## 2021-07-04 LAB — HEPATITIS BE ANTIGEN, SERUM: HEPATITIS BE ANTIGEN: NONREACTIVE

## 2021-07-07 NOTE — Discharge Summary (Signed)
[facility]  DISCHARGE SUMMARY  PATIENT NAME:  Cody Horn, Cody Horn  MRN:  [id number]  DOB:  [birth year]  ENCOUNTER START DATE: 05/28/2021  INPATIENT ADMISSION DATE: 05/28/2021  DISCHARGE DATE:  06/02/2021    ATTENDING PHYSICIAN:  Dr. Terrilee Files   PRIMARY CARE PHYSICIAN: No Pcp     ADMISSION DIAGNOSIS: Depression with suicidal ideation  Chief Complaint   Patient presents with   . Suicidal Thoughts      Pt reports SI, wanted to jump off a bridge earlier today. Issues with anxiety and depression and wants his meds changed.           DISCHARGE DIAGNOSIS: Bipolar disorder.  Adjustment disorder with suicidal ideation.  Hospital Problems) (* Primary Problem)   No active problems to display.      Resolved Hospital Problems    Diagnosis Date Noted Date Resolved   . *Depression with suicidal ideation 05/28/2021 06/01/2021     There are no active non-hospital problems to display for this patient.       DISCHARGE MEDICATIONS:     Current Discharge Medication List      CONTINUE these medications which have CHANGED during your visit.      Details   lamoTRIgine 25 mg Tablet  Commonly known as: LAMICTAL  What changed: when to take this   25 mg, Oral, 2 TIMES DAILY  Qty: 60 Tablet  Refills: 0        STOP taking these medications.    FLUoxetine 40 mg Capsule  Commonly known as: PROZAC     loratadine 10 mg Tablet  Commonly known as: CLARITIN     OLANZapine 10 mg Tablet  Commonly known as: ZYPREXA     pantoprazole 40 mg Tablet, Delayed Release (E.C.)  Commonly known as: PROTONIX        ASK your doctor about these medications.      Details   clotrimazole 1 % Cream  Commonly known as: LOTRIMIN  Ask about: Should I take this medication?   Topical, 2 TIMES DAILY  Qty: 1 Each  Refills: 0     loratadine 10 mg Tablet  Commonly known as: CLARITIN  Ask about: Should I take this medication?   10 mg, Oral, DAILY  Qty: 30 Tablet  Refills: 0     neomycin-bacitracin-polymyxin 3.5mg -400 unit- 5,000 unit/gram Ointment  Commonly known as: NEOSPORIN  Ask  about: Should I take this medication?   Apply Topically, 2 TIMES DAILY PRN  Qty: 1 Each  Refills: 0     perphenazine 4 mg Tablet  Commonly known as: TRILAFON  Ask about: Should I take this medication?   4 mg, Oral, 2 TIMES DAILY  Qty: 60 Tablet  Refills: 0            DISCHARGE INSTRUCTIONS:   No discharge procedures on file.  Post-Discharge Follow Up Appointments     Wednesday Jul 08, 2021    Return Patient Visit with Dorice Lamas, MD at  1:45 PM    [facility]  [address]  [place] [address]  [phone]          REASON FOR HOSPITALIZATION AND HOSPITAL COURSE:  This is a 36 y.o., male       who presents with c/o suicidal thoughts.Patient complains of SI ongoing for years however notes his symptoms have been worsening over the last couple of days. He reports his symptoms, "come in waves," noting a plan of jumping off of a bridge  today. Patient endorses VH and believes he has a h/o schizophrenia however he has never been formally diagnosed with schizophrenia.He follows with a Psychiatrist regularly and is prescribed Zyprexa however he believes he needs this medication adjusted."  The patient blamed his suicidal ideation to the stressors related to his daughter who he is unable to see.  Her mother has a joint custody of her.  He seems to be overwhelmed with this issue.  He is unable to sleep at night.  Complained of racing thoughts    COURSE IN HOSPITAL: The patient was admitted to the behavioral health unit and provided a safe and supportive environment.  The patient had initiation and adjustment of psychotropics and monitored for efficacy.  The patient was provided with individual and group therapy sessions to gain coping and insight of psychiatric disorders.  The patient responded very well to these treatment modalities.  Once stable and at baseline, the patient was prepared for discharge to home with the appropriate care as an outpatient arranged.       DOES PATIENT HAVE ADVANCED  DIRECTIVES:   (Staff noted that information was offered and given.)    ADVANCED CARE PLANNING - Not applicable for this patient    CONDITION ON DISCHARGE: Oriented    DISCHARGE DISPOSITION:  Home discharge     Copies sent to Care Team       Relationship Specialty Notifications Start End    Pcp, No PCP - General   04/08/21             Earlie Raveling, FNP-BC

## 2021-07-08 ENCOUNTER — Encounter (INDEPENDENT_AMBULATORY_CARE_PROVIDER_SITE_OTHER): Payer: Self-pay | Admitting: Internal Medicine

## 2021-07-08 ENCOUNTER — Other Ambulatory Visit: Payer: Self-pay

## 2021-07-08 ENCOUNTER — Ambulatory Visit (INDEPENDENT_AMBULATORY_CARE_PROVIDER_SITE_OTHER): Payer: Medicaid Other | Admitting: Internal Medicine

## 2021-07-08 VITALS — BP 121/71 | HR 81 | Temp 97.1°F | Resp 16 | Ht 64.02 in | Wt 189.0 lb

## 2021-07-08 DIAGNOSIS — G8929 Other chronic pain: Secondary | ICD-10-CM

## 2021-07-08 DIAGNOSIS — M25512 Pain in left shoulder: Secondary | ICD-10-CM

## 2021-07-08 DIAGNOSIS — Z09 Encounter for follow-up examination after completed treatment for conditions other than malignant neoplasm: Secondary | ICD-10-CM

## 2021-07-08 DIAGNOSIS — E669 Obesity, unspecified: Secondary | ICD-10-CM

## 2021-07-08 DIAGNOSIS — Z6832 Body mass index (BMI) 32.0-32.9, adult: Secondary | ICD-10-CM

## 2021-07-08 DIAGNOSIS — F329 Major depressive disorder, single episode, unspecified: Secondary | ICD-10-CM

## 2021-07-08 DIAGNOSIS — B3749 Other urogenital candidiasis: Secondary | ICD-10-CM

## 2021-07-08 DIAGNOSIS — E782 Mixed hyperlipidemia: Secondary | ICD-10-CM

## 2021-07-08 MED ORDER — NYSTATIN 100,000 UNIT/GRAM TOPICAL POWDER
Freq: Two times a day (BID) | CUTANEOUS | 2 refills | Status: DC
Start: 2021-07-08 — End: 2021-08-10

## 2021-07-08 MED ORDER — FLUCONAZOLE 150 MG TABLET
150.0000 mg | ORAL_TABLET | Freq: Once | ORAL | 1 refills | Status: DC
Start: 2021-07-08 — End: 2021-07-20

## 2021-07-08 MED ORDER — GEMFIBROZIL 600 MG TABLET
600.0000 mg | ORAL_TABLET | Freq: Two times a day (BID) | ORAL | 2 refills | Status: DC
Start: 2021-07-08 — End: 2021-10-15

## 2021-07-08 MED ORDER — CHOLESTYRAMINE-ASPARTAME 4 GRAM ORAL POWDER FOR SUSP IN A PACKET
4.0000 g | Freq: Every evening | ORAL | 2 refills | Status: DC
Start: 2021-07-08 — End: 2023-01-27

## 2021-07-10 ENCOUNTER — Other Ambulatory Visit (INDEPENDENT_AMBULATORY_CARE_PROVIDER_SITE_OTHER): Payer: Self-pay | Admitting: Family Medicine

## 2021-07-10 MED ORDER — LAMOTRIGINE 25 MG TABLET
25.0000 mg | ORAL_TABLET | Freq: Two times a day (BID) | ORAL | 0 refills | Status: DC
Start: 2021-07-10 — End: 2021-08-07

## 2021-07-10 MED ORDER — OLANZAPINE 10 MG TABLET
10.0000 mg | ORAL_TABLET | Freq: Every evening | ORAL | 0 refills | Status: DC
Start: 2021-07-10 — End: 2021-08-07

## 2021-07-20 ENCOUNTER — Other Ambulatory Visit: Payer: Self-pay

## 2021-07-20 ENCOUNTER — Encounter (INDEPENDENT_AMBULATORY_CARE_PROVIDER_SITE_OTHER): Payer: Self-pay | Admitting: Internal Medicine

## 2021-07-20 ENCOUNTER — Ambulatory Visit (INDEPENDENT_AMBULATORY_CARE_PROVIDER_SITE_OTHER): Payer: Medicaid Other | Admitting: Internal Medicine

## 2021-07-20 VITALS — BP 120/58 | HR 93 | Temp 98.3°F | Resp 16 | Ht 64.02 in | Wt 184.0 lb

## 2021-07-20 DIAGNOSIS — L989 Disorder of the skin and subcutaneous tissue, unspecified: Secondary | ICD-10-CM

## 2021-07-20 DIAGNOSIS — Z6831 Body mass index (BMI) 31.0-31.9, adult: Secondary | ICD-10-CM

## 2021-07-20 DIAGNOSIS — F1910 Other psychoactive substance abuse, uncomplicated: Secondary | ICD-10-CM

## 2021-07-20 DIAGNOSIS — F909 Attention-deficit hyperactivity disorder, unspecified type: Secondary | ICD-10-CM

## 2021-07-20 MED ORDER — AMOXICILLIN 875 MG-POTASSIUM CLAVULANATE 125 MG TABLET
1.0000 | ORAL_TABLET | Freq: Two times a day (BID) | ORAL | 1 refills | Status: DC
Start: 2021-07-20 — End: 2021-08-10

## 2021-07-20 MED ORDER — ATOMOXETINE 40 MG CAPSULE
ORAL_CAPSULE | ORAL | 1 refills | Status: DC
Start: 2021-07-20 — End: 2021-08-17

## 2021-07-20 NOTE — Progress Notes (Signed)
Cody Horn comes to Clinic complaining of multiple infected lesions over his left knee. They initially itch, so I am assuming they are bites. He scratches them and they become infected and then he attempts to press on the lesion to expel the pus. He uses un gloved hands and does not wash the skin before preforming his   "surgery"  [facility] conditions [facility] is not the most sanitary of places.   There are no lesions on the right leg. There is an isolated open lesion over the right posterior arm , close to the elbow. There is no peripheral edema or streaking  Cody Horn has told me of his ADHD and previous treatment , which was Adderall  . He did well on tbis medicine, but he is not a candidate because of his hx of substance abuse. I attempted to speak with his Psychiatrist but was unable to do so.  He is on a lot of Psych meds. When he was using illegal drugs he had symptoms of Schizophrenia but now that he is off those drugs he is no longer psychotic   I am not certain that he needs his Lamictal,  Zyprexa,  And Trilafon. It is impossible to speak with the appropriate Psych.physician to discuss al,l of this and I am hoping that he will relay this concern to the Physician he sees.    On exam    Welcome come to the Clinic with his left knee bandaged with a dirty ace wrap and no wound cover    Left knee    multiple opened  skin lesion around  2.5cm around the knee Minimal erythema, no edema , no drainage, no odor.  R upper extremity superior to elbow     5 cm opened lesions similar to above    Impression      Multiple infected skin lesions over the left knee and right upper extremity                        ADHD          Augmentin, pt instructed how to clean the wounds and given supplies  Strattera   40mg  then increased to 80mg   After 10 days        Follow up

## 2021-07-23 ENCOUNTER — Other Ambulatory Visit (INDEPENDENT_AMBULATORY_CARE_PROVIDER_SITE_OTHER): Payer: Self-pay | Admitting: Internal Medicine

## 2021-07-23 MED ORDER — LORATADINE 10 MG TABLET
10.0000 mg | ORAL_TABLET | Freq: Every day | ORAL | 2 refills | Status: DC
Start: 2021-07-23 — End: 2021-10-15

## 2021-07-23 MED ORDER — PANTOPRAZOLE 40 MG TABLET,DELAYED RELEASE
40.0000 mg | DELAYED_RELEASE_TABLET | Freq: Every day | ORAL | 1 refills | Status: DC
Start: 2021-07-23 — End: 2021-08-17

## 2021-07-27 ENCOUNTER — Ambulatory Visit (INDEPENDENT_AMBULATORY_CARE_PROVIDER_SITE_OTHER): Payer: Medicaid Other | Admitting: Internal Medicine

## 2021-07-27 ENCOUNTER — Other Ambulatory Visit: Payer: Self-pay

## 2021-07-27 ENCOUNTER — Encounter (INDEPENDENT_AMBULATORY_CARE_PROVIDER_SITE_OTHER): Payer: Self-pay | Admitting: Internal Medicine

## 2021-07-27 VITALS — BP 104/66 | HR 69 | Temp 97.1°F | Resp 16 | Ht 64.02 in | Wt 180.0 lb

## 2021-07-27 DIAGNOSIS — Z6830 Body mass index (BMI) 30.0-30.9, adult: Secondary | ICD-10-CM

## 2021-07-27 DIAGNOSIS — L989 Disorder of the skin and subcutaneous tissue, unspecified: Secondary | ICD-10-CM

## 2021-07-27 DIAGNOSIS — F1910 Other psychoactive substance abuse, uncomplicated: Secondary | ICD-10-CM

## 2021-07-27 DIAGNOSIS — E785 Hyperlipidemia, unspecified: Secondary | ICD-10-CM

## 2021-07-27 DIAGNOSIS — F32A Depression, unspecified: Secondary | ICD-10-CM

## 2021-07-27 DIAGNOSIS — F988 Other specified behavioral and emotional disorders with onset usually occurring in childhood and adolescence: Secondary | ICD-10-CM

## 2021-07-27 NOTE — Progress Notes (Signed)
Cody Horn was seen previously for multiple infected probable bites on his right knee region. After prescribing antibiotics and a warning to stop scratching the bites, the skin is almost clear, without any new lesions.   He also requested to be prescribed medication for his ADD He had been on Adderall which did restore his focus, but since the Adderall isa  considered  controlled substance Strattera was was ordered. He began taking the medication last week and believes that he can tell that he able to pay bettr attention when in class  He has no other medical concern at this time  [name] plans to work when he is in a [employer]. He has worked at a [employer]  as a Nature conservation officer and hopes he can find s similar job.    On exam     Cody Horn appears well and content  Skin           Clear except for a 1/2 inch healing lsion over his right knee  Neck         Supple  Chest         Clear  Heart         Reg rhythm and rate   CNS         No focal deficits      Impression              Healing skin lesions                                ADD                                Substance abuse    Medication  [facility]

## 2021-08-07 ENCOUNTER — Other Ambulatory Visit (INDEPENDENT_AMBULATORY_CARE_PROVIDER_SITE_OTHER): Payer: Self-pay | Admitting: Family Medicine

## 2021-08-07 MED ORDER — LAMOTRIGINE 25 MG TABLET
25.0000 mg | ORAL_TABLET | Freq: Two times a day (BID) | ORAL | 2 refills | Status: DC
Start: 2021-08-07 — End: 2021-08-17

## 2021-08-07 MED ORDER — OLANZAPINE 10 MG TABLET
10.0000 mg | ORAL_TABLET | Freq: Every evening | ORAL | 2 refills | Status: DC
Start: 2021-08-07 — End: 2021-08-31

## 2021-08-10 ENCOUNTER — Ambulatory Visit (INDEPENDENT_AMBULATORY_CARE_PROVIDER_SITE_OTHER): Payer: Medicaid Other | Admitting: Internal Medicine

## 2021-08-10 ENCOUNTER — Encounter (INDEPENDENT_AMBULATORY_CARE_PROVIDER_SITE_OTHER): Payer: Self-pay | Admitting: Internal Medicine

## 2021-08-10 ENCOUNTER — Other Ambulatory Visit: Payer: Self-pay

## 2021-08-10 VITALS — BP 100/58 | HR 65 | Temp 98.0°F | Resp 16 | Ht 64.02 in | Wt 182.0 lb

## 2021-08-10 DIAGNOSIS — Z7189 Other specified counseling: Secondary | ICD-10-CM

## 2021-08-10 DIAGNOSIS — F988 Other specified behavioral and emotional disorders with onset usually occurring in childhood and adolescence: Secondary | ICD-10-CM

## 2021-08-10 DIAGNOSIS — Z09 Encounter for follow-up examination after completed treatment for conditions other than malignant neoplasm: Secondary | ICD-10-CM

## 2021-08-10 DIAGNOSIS — Z8659 Personal history of other mental and behavioral disorders: Secondary | ICD-10-CM

## 2021-08-10 NOTE — Progress Notes (Signed)
Cody Horn returns to [facility] to follow up on his wound which had plagued him for some time. The wound over his right patella has healed completely.   I started him on Strattera for his ADD. He says he is better able to concentrate.  I read his admission at [facility] for his SI.  The meds he was discharged on are not the meds he should be taking. H will return with all his pill vials and we will discard those that are not relevant.

## 2021-08-12 ENCOUNTER — Encounter (INDEPENDENT_AMBULATORY_CARE_PROVIDER_SITE_OTHER): Payer: Self-pay | Admitting: Internal Medicine

## 2021-08-17 ENCOUNTER — Encounter (INDEPENDENT_AMBULATORY_CARE_PROVIDER_SITE_OTHER): Payer: Self-pay | Admitting: Internal Medicine

## 2021-08-17 ENCOUNTER — Ambulatory Visit (INDEPENDENT_AMBULATORY_CARE_PROVIDER_SITE_OTHER): Payer: Medicaid Other | Admitting: Internal Medicine

## 2021-08-17 ENCOUNTER — Other Ambulatory Visit: Payer: Self-pay

## 2021-08-17 VITALS — BP 123/75 | HR 77 | Temp 97.7°F | Resp 16 | Ht 64.02 in | Wt 180.0 lb

## 2021-08-17 DIAGNOSIS — Z6830 Body mass index (BMI) 30.0-30.9, adult: Secondary | ICD-10-CM

## 2021-08-17 DIAGNOSIS — Z79899 Other long term (current) drug therapy: Secondary | ICD-10-CM

## 2021-08-17 DIAGNOSIS — Z09 Encounter for follow-up examination after completed treatment for conditions other than malignant neoplasm: Secondary | ICD-10-CM

## 2021-08-17 DIAGNOSIS — F988 Other specified behavioral and emotional disorders with onset usually occurring in childhood and adolescence: Secondary | ICD-10-CM

## 2021-08-17 DIAGNOSIS — F1911 Other psychoactive substance abuse, in remission: Secondary | ICD-10-CM

## 2021-08-17 MED ORDER — ATOMOXETINE 80 MG CAPSULE
80.0000 mg | ORAL_CAPSULE | Freq: Every day | ORAL | 1 refills | Status: AC
Start: 2021-08-17 — End: ?

## 2021-08-17 MED ORDER — PANTOPRAZOLE 40 MG TABLET,DELAYED RELEASE
40.0000 mg | DELAYED_RELEASE_TABLET | Freq: Every day | ORAL | 0 refills | Status: DC
Start: 2021-08-17 — End: 2021-10-15

## 2021-08-17 NOTE — Progress Notes (Signed)
On Cody Horn's last visit, it appeared that he was confused about which medications he was taking. Cody Horn had been placed on medication when he was at a [employer] center and then prescribed several Psych. Medication when  he was admitted to [facility] When he was discharged from [facility] he was told to d/c his Zyprexa, which he failed to do. At the time I med Cody Horn ,, he was on Trilafon, Zyprexa, Lamictal along with Claritin, Folic Acid and Omeprazole.   He was placed on several of these meds while he still had the influence of his street meds.  Currently he does not display any Schizophrenic behavior. I am slowly weening his off all those meds.  I started him on Strattera, which he days has helped him to better concentrate  He id ip to 80mg /day  He is planning to move to [facility] soon as he is eligible.    I will see I'm within the month to see how he is faring

## 2021-08-24 ENCOUNTER — Encounter (INDEPENDENT_AMBULATORY_CARE_PROVIDER_SITE_OTHER): Payer: Self-pay | Admitting: Internal Medicine

## 2021-08-31 ENCOUNTER — Encounter (INDEPENDENT_AMBULATORY_CARE_PROVIDER_SITE_OTHER): Payer: Self-pay | Admitting: Internal Medicine

## 2021-08-31 ENCOUNTER — Ambulatory Visit (INDEPENDENT_AMBULATORY_CARE_PROVIDER_SITE_OTHER): Payer: Medicaid Other | Admitting: Internal Medicine

## 2021-08-31 ENCOUNTER — Other Ambulatory Visit: Payer: Self-pay

## 2021-08-31 VITALS — BP 110/60 | HR 80 | Temp 98.4°F | Resp 16 | Ht 64.02 in | Wt 177.0 lb

## 2021-08-31 DIAGNOSIS — Z09 Encounter for follow-up examination after completed treatment for conditions other than malignant neoplasm: Secondary | ICD-10-CM

## 2021-08-31 NOTE — Progress Notes (Signed)
Cody Horn returns to [facility] to discuss his progress with Strattera and his improved ability to stay focused .  Although he finds the various  Coping Groups boring and non purposeful, he is able to stay on target .  [facility] has to [facility] and then to to a [employer].  His ultimate goal is to return to [facility], where his girlfriend resides  He was put on Seroquel for sleep. It is helping  Cody Horn has no physical complaints.other than his frustration in not being able to work     I did try to tampen his frustration with the System. I am hoping he does not burn his bridges  Will see him  back in a few months

## 2021-10-15 ENCOUNTER — Other Ambulatory Visit: Payer: Self-pay

## 2021-10-15 ENCOUNTER — Encounter (INDEPENDENT_AMBULATORY_CARE_PROVIDER_SITE_OTHER): Payer: Self-pay | Admitting: Internal Medicine

## 2021-10-15 ENCOUNTER — Ambulatory Visit (INDEPENDENT_AMBULATORY_CARE_PROVIDER_SITE_OTHER): Payer: Medicaid Other | Admitting: Internal Medicine

## 2021-10-15 VITALS — BP 110/60 | HR 85 | Temp 98.0°F | Resp 16 | Ht 64.02 in | Wt 179.0 lb

## 2021-10-15 DIAGNOSIS — H9312 Tinnitus, left ear: Secondary | ICD-10-CM

## 2021-10-15 MED ORDER — CYANOCOBALAMIN (VIT B-12) 1,000 MCG/ML INJECTION SOLUTION
1000.0000 ug | Freq: Once | INTRAMUSCULAR | 0 refills | Status: AC
Start: 2021-10-15 — End: 2021-10-15

## 2021-10-15 MED ORDER — GEMFIBROZIL 600 MG TABLET
600.0000 mg | ORAL_TABLET | Freq: Two times a day (BID) | ORAL | 2 refills | Status: AC
Start: 2021-10-15 — End: ?

## 2021-10-15 NOTE — Progress Notes (Signed)
On set of tinnitus about 3 weeks ago. Patient does not think he was exposed to high frequency noise, no trauma, on no medication that would cause the constant ringing, He is not nauseated or dizzy.  H says even watching TV does not obliterate the sound.    On exam     Right ear    canal is clear  TM appears intact, the Malleus appears                       Left ear      canal is clear,, the TM does not appear  intact,  with cloudiness of the membrane along with possible mid membrane rupture  Hearng is better in the right      Tinnitus  left with possible partial TM rupture    ENT referral  B12 IM  (read an article stating that B12 might reduce the Tinnitus)

## 2021-11-02 ENCOUNTER — Encounter (INDEPENDENT_AMBULATORY_CARE_PROVIDER_SITE_OTHER): Payer: Self-pay | Admitting: Internal Medicine

## 2021-11-02 ENCOUNTER — Other Ambulatory Visit: Payer: Self-pay

## 2021-11-02 ENCOUNTER — Ambulatory Visit (INDEPENDENT_AMBULATORY_CARE_PROVIDER_SITE_OTHER): Payer: Medicaid Other | Admitting: Internal Medicine

## 2021-11-02 VITALS — BP 102/60 | HR 73 | Temp 98.1°F | Resp 16 | Ht 64.02 in | Wt 177.0 lb

## 2021-11-02 DIAGNOSIS — H9312 Tinnitus, left ear: Secondary | ICD-10-CM

## 2021-11-02 DIAGNOSIS — K219 Gastro-esophageal reflux disease without esophagitis: Secondary | ICD-10-CM

## 2021-11-02 MED ORDER — PANTOPRAZOLE 40 MG TABLET,DELAYED RELEASE
40.0000 mg | DELAYED_RELEASE_TABLET | Freq: Every day | ORAL | 0 refills | Status: DC
Start: 2021-11-02 — End: 2021-12-28

## 2021-11-02 NOTE — Progress Notes (Signed)
Cody Horn returns to [facility].to report on his tinnitus left ear and mild frontal HA.  The headache has abated. The tinnitus  is less annoying. He does have a referral to ENT.  He has been on Strattera for about 6 weeks and feels more focused.  His GERD is responding to Protonix  He is about to leave the [place] for [facility]. He plans to make sufficient income to return to the [facility] and live with his daughter and her mother  He will return to Clinic after he see the ENT person

## 2021-11-30 ENCOUNTER — Ambulatory Visit (HOSPITAL_BASED_OUTPATIENT_CLINIC_OR_DEPARTMENT_OTHER): Payer: Self-pay | Admitting: PHYSICIAN ASSISTANT

## 2021-12-02 ENCOUNTER — Other Ambulatory Visit: Payer: Self-pay

## 2021-12-02 ENCOUNTER — Encounter (INDEPENDENT_AMBULATORY_CARE_PROVIDER_SITE_OTHER): Payer: Self-pay | Admitting: Internal Medicine

## 2021-12-02 ENCOUNTER — Ambulatory Visit (INDEPENDENT_AMBULATORY_CARE_PROVIDER_SITE_OTHER): Payer: Medicaid Other | Admitting: Internal Medicine

## 2021-12-02 VITALS — BP 122/62 | HR 72 | Temp 97.7°F | Resp 16 | Ht 64.02 in | Wt 175.0 lb

## 2021-12-02 DIAGNOSIS — Z09 Encounter for follow-up examination after completed treatment for conditions other than malignant neoplasm: Secondary | ICD-10-CM

## 2021-12-02 NOTE — Progress Notes (Signed)
Cody Horn returns to [facility] to discuss his tinnitus.  He says he still hears the roaring sounds, but has learned to ignore them  He was supposed to see ENT, but had to cancel due to his work schedule.He feels the Cody Horn has helped with the focus issue. Cody Horn is now living at one of the [employer]. He is unhappy with the rules. They interfere with his work schedule at [employer].He is very angry about his life style and his inability to contact his daughter and her  mother. They live in [place] He has visiting rights and is unable to set up any line of communication His name is on the birth  certificate, but he does not pay child support. The question is  What are his legal rights  He plans to consult an attorney as soon as he has money  The anger issue is quite serious, at least in my estimation. He threatens people who he disagrees with, and his negative attitude will interfere with his social interactions.  The Lamictal dose is non therapeutic. I will increase the dose to 50mg  bid and see him back in 2 weeks. I hope with increasing the Lamictal does he will be less angry

## 2021-12-17 ENCOUNTER — Encounter (INDEPENDENT_AMBULATORY_CARE_PROVIDER_SITE_OTHER): Payer: Self-pay | Admitting: Internal Medicine

## 2021-12-17 ENCOUNTER — Other Ambulatory Visit: Payer: Self-pay

## 2021-12-17 ENCOUNTER — Ambulatory Visit (INDEPENDENT_AMBULATORY_CARE_PROVIDER_SITE_OTHER): Payer: Medicaid Other | Admitting: Internal Medicine

## 2021-12-17 VITALS — BP 130/70 | HR 93 | Temp 95.4°F | Resp 16 | Ht 64.02 in | Wt 176.0 lb

## 2021-12-17 DIAGNOSIS — Z09 Encounter for follow-up examination after completed treatment for conditions other than malignant neoplasm: Secondary | ICD-10-CM

## 2021-12-17 DIAGNOSIS — R454 Irritability and anger: Secondary | ICD-10-CM

## 2021-12-17 DIAGNOSIS — F988 Other specified behavioral and emotional disorders with onset usually occurring in childhood and adolescence: Secondary | ICD-10-CM

## 2021-12-17 NOTE — Progress Notes (Signed)
Cody Horn returns to [facility] after a 2 week hiatus to see if  increasing the dose of his Lamictal has reduced his inappropriate negativity and frustration. These feelings manifest as stubbornness , short temper and aloofness.  He says there may be some improvement in his outlook.,but with all his issues namely insufficient work hours due to rules limiting  his hours after dark(needs to be back at the [employer] before 11 pm) the mandatory attendance at various group support and the general attitude with his roommates.that lead to a disagreeable environment.  I am increasing his Lamictal dose to 75mg   BID.    [name] has no medical complaints  Will see him in  2 weeks

## 2021-12-28 ENCOUNTER — Other Ambulatory Visit (INDEPENDENT_AMBULATORY_CARE_PROVIDER_SITE_OTHER): Payer: Self-pay | Admitting: Internal Medicine

## 2021-12-28 MED ORDER — PANTOPRAZOLE 40 MG TABLET,DELAYED RELEASE
40.0000 mg | DELAYED_RELEASE_TABLET | Freq: Every day | ORAL | 1 refills | Status: AC
Start: 2021-12-28 — End: ?

## 2021-12-28 MED ORDER — QUETIAPINE 100 MG TABLET
100.0000 mg | ORAL_TABLET | Freq: Every evening | ORAL | 1 refills | Status: DC
Start: 2021-12-28 — End: 2023-01-27

## 2021-12-28 MED ORDER — LAMOTRIGINE 100 MG DISINTEGRATING TABLET
100.0000 mg | ORAL_TABLET | Freq: Two times a day (BID) | ORAL | 2 refills | Status: DC
Start: 2021-12-28 — End: 2023-01-27

## 2021-12-31 ENCOUNTER — Encounter (INDEPENDENT_AMBULATORY_CARE_PROVIDER_SITE_OTHER): Payer: Self-pay | Admitting: Internal Medicine

## 2022-01-06 ENCOUNTER — Encounter (INDEPENDENT_AMBULATORY_CARE_PROVIDER_SITE_OTHER): Payer: Self-pay | Admitting: Internal Medicine

## 2022-01-20 ENCOUNTER — Other Ambulatory Visit: Payer: Medicaid Other | Admitting: EXTERNAL

## 2022-01-21 ENCOUNTER — Other Ambulatory Visit: Payer: Medicaid Other | Attending: NURSE PRACTITIONER | Admitting: NURSE PRACTITIONER

## 2022-01-21 DIAGNOSIS — F39 Unspecified mood [affective] disorder: Secondary | ICD-10-CM | POA: Insufficient documentation

## 2022-01-21 LAB — DRUG SCREEN, NO CONFIRMATION, URINE
AMPHETAMINES, URINE: NEGATIVE
BARBITURATES URINE: NEGATIVE
BENZODIAZEPINES URINE: POSITIVE — AB
BUPRENORPHINE URINE: NEGATIVE
CANNABINOIDS URINE: NEGATIVE
COCAINE METABOLITES URINE: NEGATIVE
CREATININE RANDOM URINE: 192 mg/dL — ABNORMAL HIGH (ref 50–100)
ECSTASY/MDMA URINE: NEGATIVE
FENTANYL, RANDOM URINE: NEGATIVE
METHADONE URINE: NEGATIVE
OPIATES URINE (LOW CUTOFF): NEGATIVE
OXYCODONE URINE: NEGATIVE

## 2022-01-21 LAB — SALICYLATE ACID LEVEL: SALICYLATE LEVEL: <5 mg/dL — ABNORMAL LOW (ref 15–30)

## 2022-02-08 ENCOUNTER — Other Ambulatory Visit: Payer: Medicaid Other

## 2022-02-08 DIAGNOSIS — F419 Anxiety disorder, unspecified: Secondary | ICD-10-CM | POA: Insufficient documentation

## 2022-02-08 DIAGNOSIS — F4310 Post-traumatic stress disorder, unspecified: Secondary | ICD-10-CM | POA: Insufficient documentation

## 2022-02-08 DIAGNOSIS — F319 Bipolar disorder, unspecified: Secondary | ICD-10-CM | POA: Insufficient documentation

## 2022-02-08 LAB — CBC WITH DIFF
BASOPHIL #: <0.1 10*3/uL (ref <=0.20)
BASOPHIL %: 1 %
EOSINOPHIL #: 0.2 10*3/uL (ref <=0.50)
EOSINOPHIL %: 3 %
HCT: 49.6 % (ref 38.9–52.0)
HGB: 16.5 g/dL (ref 13.4–17.5)
IMMATURE GRANULOCYTE #: <0.1 10*3/uL (ref <0.10)
IMMATURE GRANULOCYTE %: 0.4 % (ref 0.0–1.0)
LYMPHOCYTE #: 1.57 10*3/uL (ref 1.00–4.80)
LYMPHOCYTE %: 23.2 %
MCH: 30.7 pg (ref 26.0–32.0)
MCHC: 33.3 g/dL (ref 31.0–35.5)
MCV: 92.2 fL (ref 78.0–100.0)
MONOCYTE #: 0.45 10*3/uL (ref 0.20–1.10)
MONOCYTE %: 6.6 %
MPV: 9.8 fL (ref 8.7–12.5)
NEUTROPHIL #: 4.45 10*3/uL (ref 1.50–7.70)
NEUTROPHIL %: 65.8 %
PLATELETS: 257 10*3/uL (ref 150–400)
RBC: 5.38 10*6/uL (ref 4.50–6.10)
RDW-CV: 13 % (ref 11.5–15.5)
WBC: 6.8 10*3/uL (ref 3.7–11.0)

## 2022-02-08 LAB — BASIC METABOLIC PANEL
ANION GAP: 7 mmol/L (ref 4–13)
BUN/CREA RATIO: 20 (ref 6–22)
BUN: 21 mg/dL (ref 8–25)
CALCIUM: 9.2 mg/dL (ref 8.6–10.2)
CHLORIDE: 109 mmol/L (ref 96–111)
CO2 TOTAL: 22 mmol/L (ref 22–30)
CREATININE: 1.07 mg/dL (ref 0.75–1.35)
ESTIMATED GFR - MALE: >90 mL/min/BSA (ref >=60)
GLUCOSE: 97 mg/dL (ref 65–125)
POTASSIUM: 4.7 mmol/L (ref 3.5–5.1)
SODIUM: 138 mmol/L (ref 136–145)

## 2022-02-08 LAB — LIPID PANEL
CHOL/HDL RATIO: 4.5
CHOLESTEROL: 189 mg/dL (ref 100–200)
HDL CHOL: 42 mg/dL — ABNORMAL LOW (ref >=50)
LDL CALC: 127 mg/dL — ABNORMAL HIGH (ref <100)
NON-HDL: 147 mg/dL (ref <=190)
TRIGLYCERIDES: 110 mg/dL (ref <150)
VLDL CALC: 19 mg/dL (ref <30)

## 2022-02-08 LAB — AST (SGOT): AST (SGOT): 16 U/L (ref 8–45)

## 2022-02-08 LAB — ALK PHOS (ALKALINE PHOSPHATASE): ALKALINE PHOSPHATASE: 82 U/L (ref 45–115)

## 2022-02-08 LAB — THYROID STIMULATING HORMONE (SENSITIVE TSH): TSH: 1.596 u[IU]/mL (ref 0.350–4.940)

## 2022-02-09 LAB — VITAMIN D 25 TOTAL: VITAMIN D, 25OH: 18 ng/mL — ABNORMAL LOW (ref 30–100)

## 2022-03-08 ENCOUNTER — Other Ambulatory Visit: Payer: Medicaid Other | Attending: PHYSICIAN ASSISTANT | Admitting: PHYSICIAN ASSISTANT

## 2022-03-08 DIAGNOSIS — F4310 Post-traumatic stress disorder, unspecified: Secondary | ICD-10-CM | POA: Insufficient documentation

## 2022-03-08 DIAGNOSIS — R4589 Other symptoms and signs involving emotional state: Secondary | ICD-10-CM | POA: Insufficient documentation

## 2022-03-08 LAB — LIPID PANEL
CHOL/HDL RATIO: 5.2
CHOLESTEROL: 191 mg/dL (ref 100–200)
HDL CHOL: 37 mg/dL — ABNORMAL LOW (ref >=50)
LDL CALC: 127 mg/dL — ABNORMAL HIGH (ref <100)
NON-HDL: 154 mg/dL (ref <=190)
TRIGLYCERIDES: 152 mg/dL — ABNORMAL HIGH (ref <150)
VLDL CALC: 27 mg/dL (ref <30)

## 2022-03-08 LAB — CBC WITH DIFF
BASOPHIL #: <0.1 10*3/uL (ref <=0.20)
BASOPHIL %: 0.9 %
EOSINOPHIL #: <0.1 10*3/uL (ref <=0.50)
EOSINOPHIL %: 0.7 %
HCT: 47.3 % (ref 38.9–52.0)
HGB: 15.5 g/dL (ref 13.4–17.5)
IMMATURE GRANULOCYTE #: <0.1 10*3/uL (ref <0.10)
IMMATURE GRANULOCYTE %: 0.2 % (ref 0.0–1.0)
LYMPHOCYTE #: 1.62 10*3/uL (ref 1.00–4.80)
LYMPHOCYTE %: 19.1 %
MCH: 30.2 pg (ref 26.0–32.0)
MCHC: 32.8 g/dL (ref 31.0–35.5)
MCV: 92.2 fL (ref 78.0–100.0)
MONOCYTE #: 0.53 10*3/uL (ref 0.20–1.10)
MONOCYTE %: 6.3 %
MPV: 9.9 fL (ref 8.7–12.5)
NEUTROPHIL #: 6.15 10*3/uL (ref 1.50–7.70)
NEUTROPHIL %: 72.8 %
PLATELETS: 245 10*3/uL (ref 150–400)
RBC: 5.13 10*6/uL (ref 4.50–6.10)
RDW-CV: 13.5 % (ref 11.5–15.5)
WBC: 8.5 10*3/uL (ref 3.7–11.0)

## 2022-03-08 LAB — BASIC METABOLIC PANEL
ANION GAP: 8 mmol/L (ref 4–13)
BUN/CREA RATIO: 13 (ref 6–22)
BUN: 13 mg/dL (ref 8–25)
CALCIUM: 9.2 mg/dL (ref 8.6–10.2)
CHLORIDE: 109 mmol/L (ref 96–111)
CO2 TOTAL: 26 mmol/L (ref 22–30)
CREATININE: 1 mg/dL (ref 0.75–1.35)
ESTIMATED GFR - MALE: >90 mL/min/BSA (ref >=60)
GLUCOSE: 90 mg/dL (ref 65–125)
POTASSIUM: 4.8 mmol/L (ref 3.5–5.1)
SODIUM: 143 mmol/L (ref 136–145)

## 2022-03-08 LAB — ALT (SGPT): ALT (SGPT): 19 U/L (ref 10–55)

## 2022-03-08 LAB — AST (SGOT): AST (SGOT): 18 U/L (ref 8–45)

## 2022-03-08 LAB — ALK PHOS (ALKALINE PHOSPHATASE): ALKALINE PHOSPHATASE: 85 U/L (ref 45–115)

## 2022-03-08 LAB — THYROID STIMULATING HORMONE (SENSITIVE TSH): TSH: 0.993 u[IU]/mL (ref 0.350–4.940)

## 2022-03-09 LAB — VITAMIN D 25 TOTAL: VITAMIN D, 25OH: 23 ng/mL — ABNORMAL LOW (ref 30–100)

## 2022-04-06 ENCOUNTER — Other Ambulatory Visit: Payer: Self-pay

## 2022-04-06 ENCOUNTER — Emergency Department
Admission: EM | Admit: 2022-04-06 | Discharge: 2022-04-06 | Disposition: A | Discharge status: Home / Self Care | Payer: Medicaid Other | Attending: Physician Assistant | Admitting: Physician Assistant

## 2022-04-06 ENCOUNTER — Emergency Department (HOSPITAL_COMMUNITY): Payer: Medicaid Other

## 2022-04-06 ENCOUNTER — Emergency Department (EMERGENCY_DEPARTMENT_HOSPITAL): Payer: Medicaid Other | Admitting: Radiology

## 2022-04-06 ENCOUNTER — Encounter (HOSPITAL_COMMUNITY): Payer: Self-pay

## 2022-04-06 DIAGNOSIS — Z87891 Personal history of nicotine dependence: Secondary | ICD-10-CM

## 2022-04-06 DIAGNOSIS — B349 Viral infection, unspecified: Secondary | ICD-10-CM | POA: Insufficient documentation

## 2022-04-06 DIAGNOSIS — R059 Cough, unspecified: Secondary | ICD-10-CM | POA: Insufficient documentation

## 2022-04-06 DIAGNOSIS — Z1152 Encounter for screening for COVID-19: Secondary | ICD-10-CM | POA: Insufficient documentation

## 2022-04-06 DIAGNOSIS — R058 Other specified cough: Secondary | ICD-10-CM

## 2022-04-06 LAB — COVID-19 ~~LOC~~ MOLECULAR LAB TESTING
INFLUENZA VIRUS TYPE A: NOT DETECTED
INFLUENZA VIRUS TYPE B: NOT DETECTED
RESPIRATORY SYNCTIAL VIRUS (RSV): NOT DETECTED
SARS-CoV-2: NOT DETECTED

## 2022-04-06 MED ORDER — PROMETHAZINE-DM 6.25 MG-15 MG/5 ML ORAL SYRUP
5.0000 mL | ORAL_SOLUTION | Freq: Four times a day (QID) | ORAL | 0 refills | Status: AC | PRN
Start: 2022-04-06 — End: ?

## 2022-04-06 NOTE — ED Triage Notes (Signed)
Pt arrives ambulatory to ED c/o flu like symptoms cough for past few days

## 2022-04-06 NOTE — ED Nurses Note (Signed)
Discharge instructions given to patient with written and verbal instructions. Prescriptions handed to pt. Work slip handed to pt.  Educated on disease process and instructed to follow up with PCP. Pt verbalized understanding. No further questions at this time.  No s/s of distress at discharge.  Pt ambulated out of department.

## 2022-04-06 NOTE — ED Provider Notes (Signed)
[facility] Emergency Department           Clinical Impression   Viral syndrome (Primary)   Cough, unspecified type       ED Course/Medical Decision Making:    Pt ppears well, VSS, no sing resp distress. In with URI sx's. PERC neg. No indication for blood work at this time. Will treat symptomatically and f/u with PCP or given ED return p[recautions               Disposition: Discharged           Discharge Medication List as of 04/06/2022 10:29 PM        START taking these medications    Details   promethazine-dextromethorphan (PHENERGAN-DM) 6.25-15 mg/5 mL Oral Syrup Take 5 mL by mouth Four times a day as needed for Cough, Disp-120 mL, R-0, Print             Chief Complaint:  Patient presents with     Chief Complaint   Patient presents with    Flu Like Symptoms         HPI    Cody Horn, date of birth May 15, 1985, is a 37 y.o. male who presents to the Emergency Department 37 year old male in complaining of productive cough for the past 3-4 days.  Denies any fever, chest pain or shortness of breath but notes some mild chest tightness when he is coughing.  No significant past medical history.  Also notes rhinorrhea and postnasal drip.  Denies any abdominal pain but states he is felt nauseated and cough until he gags.  He has not taken anything for symptoms.      Review of Systems      Physical Exam  Vitals and nursing note reviewed.   Constitutional:       Appearance: Normal appearance.   HENT:      Head: Normocephalic and atraumatic.      Nose: Nose normal.      Mouth/Throat:      Mouth: Mucous membranes are moist.      Pharynx: No oropharyngeal exudate or posterior oropharyngeal erythema.   Eyes:      Conjunctiva/sclera: Conjunctivae normal.   Cardiovascular:      Rate and Rhythm: Normal rate and regular rhythm.      Heart sounds: Normal heart sounds.   Pulmonary:      Effort: Pulmonary effort is normal.      Breath sounds: Normal breath sounds.   Abdominal:      Tenderness: There is no abdominal  tenderness. There is no guarding or rebound.   Musculoskeletal:         General: Normal range of motion.      Right lower leg: No edema.      Left lower leg: No edema.   Skin:     Findings: No rash.   Neurological:      General: No focal deficit present.      Mental Status: He is alert and oriented to person, place, and time.   Psychiatric:         Mood and Affect: Mood normal.         Behavior: Behavior normal.         Vitals:  Filed Vitals:    04/06/22 2058 04/06/22 2130   BP: (!) 149/86 127/77   Pulse: 100    Resp: 18    Temp: 37 C (98.6 F)    SpO2: 98% 98%  Past Medical History:  Diagnosis     Past Medical History:   Diagnosis Date    Anger     anxiety     depression     History of ADHD     HTN (hypertension)        Current Outpatient Medications   Medication Sig    atomoxetine (STRATTERA) 80 mg Oral Capsule Take 1 Capsule (80 mg total) by mouth Once a day    cholestyramine-aspartame (PREVALITE) 4 gram Oral Powder in Packet Take 1 Packet (4 g total) by mouth Every evening with dinner    gemfibroziL (LOPID) 600 mg Oral Tablet Take 1 Tablet (600 mg total) by mouth Twice a day before meals    lamoTRIgine (LAMICTAL ODT) 100 mg Oral Tablet, Rapid Dissolve Take 1 Tablet (100 mg total) by mouth Twice daily    NARCAN 4 mg/actuation Nasal Spray, Non-Aerosol     pantoprazole (PROTONIX) 40 mg Oral Tablet, Delayed Release (E.C.) Take 1 Tablet (40 mg total) by mouth Once a day    promethazine-dextromethorphan (PHENERGAN-DM) 6.25-15 mg/5 mL Oral Syrup Take 5 mL by mouth Four times a day as needed for Cough    QUEtiapine (SEROQUEL) 100 mg Oral Tablet Take 1 Tablet (100 mg total) by mouth Every night Take one (100 ,mg) tab at bedtime       Past Surgical History:  Past Surgical History:   Procedure Laterality Date    Hx hernia repair         Family History:   Family History   Problem Relation Age of Onset    Hypertension (High Blood Pressure) Mother     Diabetes Father     Hypertension (High Blood Pressure) Brother      ADHD/ADD Brother     Diabetes Paternal Uncle     Bipolar Disorder Paternal Uncle        Social History     Social History     Tobacco Use    Smoking status: Former     Current packs/day: 0.25     Average packs/day: 0.3 packs/day for 17.0 years (4.3 ttl pk-yrs)     Types: Cigarettes    Smokeless tobacco: Former     Types: Snuff   Vaping Use    Vaping status: Every Day    Substances: Nicotine, Flavoring    Devices: Refillable tank   Substance Use Topics    Alcohol use: Not Currently     Comment: not for 3 months    Drug use: Not Currently     Types: Methamphetamines, Cocaine, Marijuana, Other     Comment: off 3 months         Social History Main Topics     Social History     Substance and Sexual Activity   Drug Use Not Currently    Types: Methamphetamines, Cocaine, Marijuana, Other    Comment: off 3 months           No Known Allergies        Vital Signs:  Pre-disposition vitals  Filed Vitals:    04/06/22 2058 04/06/22 2130   BP: (!) 149/86 127/77   Pulse: 100    Resp: 18    Temp: 37 C (98.6 F)    SpO2: 98% 98%           Diagnostics:    Labs:    Results for orders placed or performed during the hospital encounter of 04/06/22   COVID-19 SCREENING - Asymptomatic   Result  Value Ref Range    SARS-CoV-2 Not Detected Not Detected    INFLUENZA VIRUS TYPE A Not Detected Not Detected    INFLUENZA VIRUS TYPE B Not Detected Not Detected    RESPIRATORY SYNCTIAL VIRUS (RSV) Not Detected Not Detected     Labs reviewed and interpreted by me.    Radiology:    Results for orders placed or performed during the hospital encounter of 04/06/22   MOBILE CHEST X-RAY     Status: None    Narrative    [place]  Male, 37 years old.    XR AP MOBILE CHEST performed on 04/06/2022 10:01 PM.    REASON FOR EXAM:  cough    TECHNIQUE: 1 views/1 images submitted for interpretation.    COMPARISON:  None available    FINDINGS:  There is a shallow inspiration. The heart size is prominent, likely exacerbated by low lung volumes. No definite focal  consolidation, pleural effusion, or pneumothorax is identified. Included portions of the upper abdomen are unremarkable. No acute osseous abnormality is seen.      Impression    Shallow inspiration without acute cardiopulmonary process.             Orders:  Orders Placed This Encounter    MOBILE CHEST X-RAY    COVID-19 SCREENING - Asymptomatic    promethazine-dextromethorphan (PHENERGAN-DM) 6.25-15 mg/5 mL Oral Syrup               Parts of this patient's chart may be completed in a retrospective fashion due to simultaneous direct patient care activities in the Emergency Department.   This note was partially generated using MModal Fluency Direct system, and there may be some incorrect words, spellings, and punctuation that were not noted in checking the note before saving.        Toney Rakes, PA-C  The co-signing faculty was physically present in [place] and available for consultation and did not particpate in the care of this patient.@  Toney Rakes, PA-C  04/06/2022, 21:30

## 2022-07-24 IMAGING — CR DG CHEST 2V
2 series · 2 of 2 positions shown · non-contrast
Comparison: None.

CLINICAL DATA: Persistent cough for several days.

EXAM:
CHEST - 2 VIEW

[chest pa]
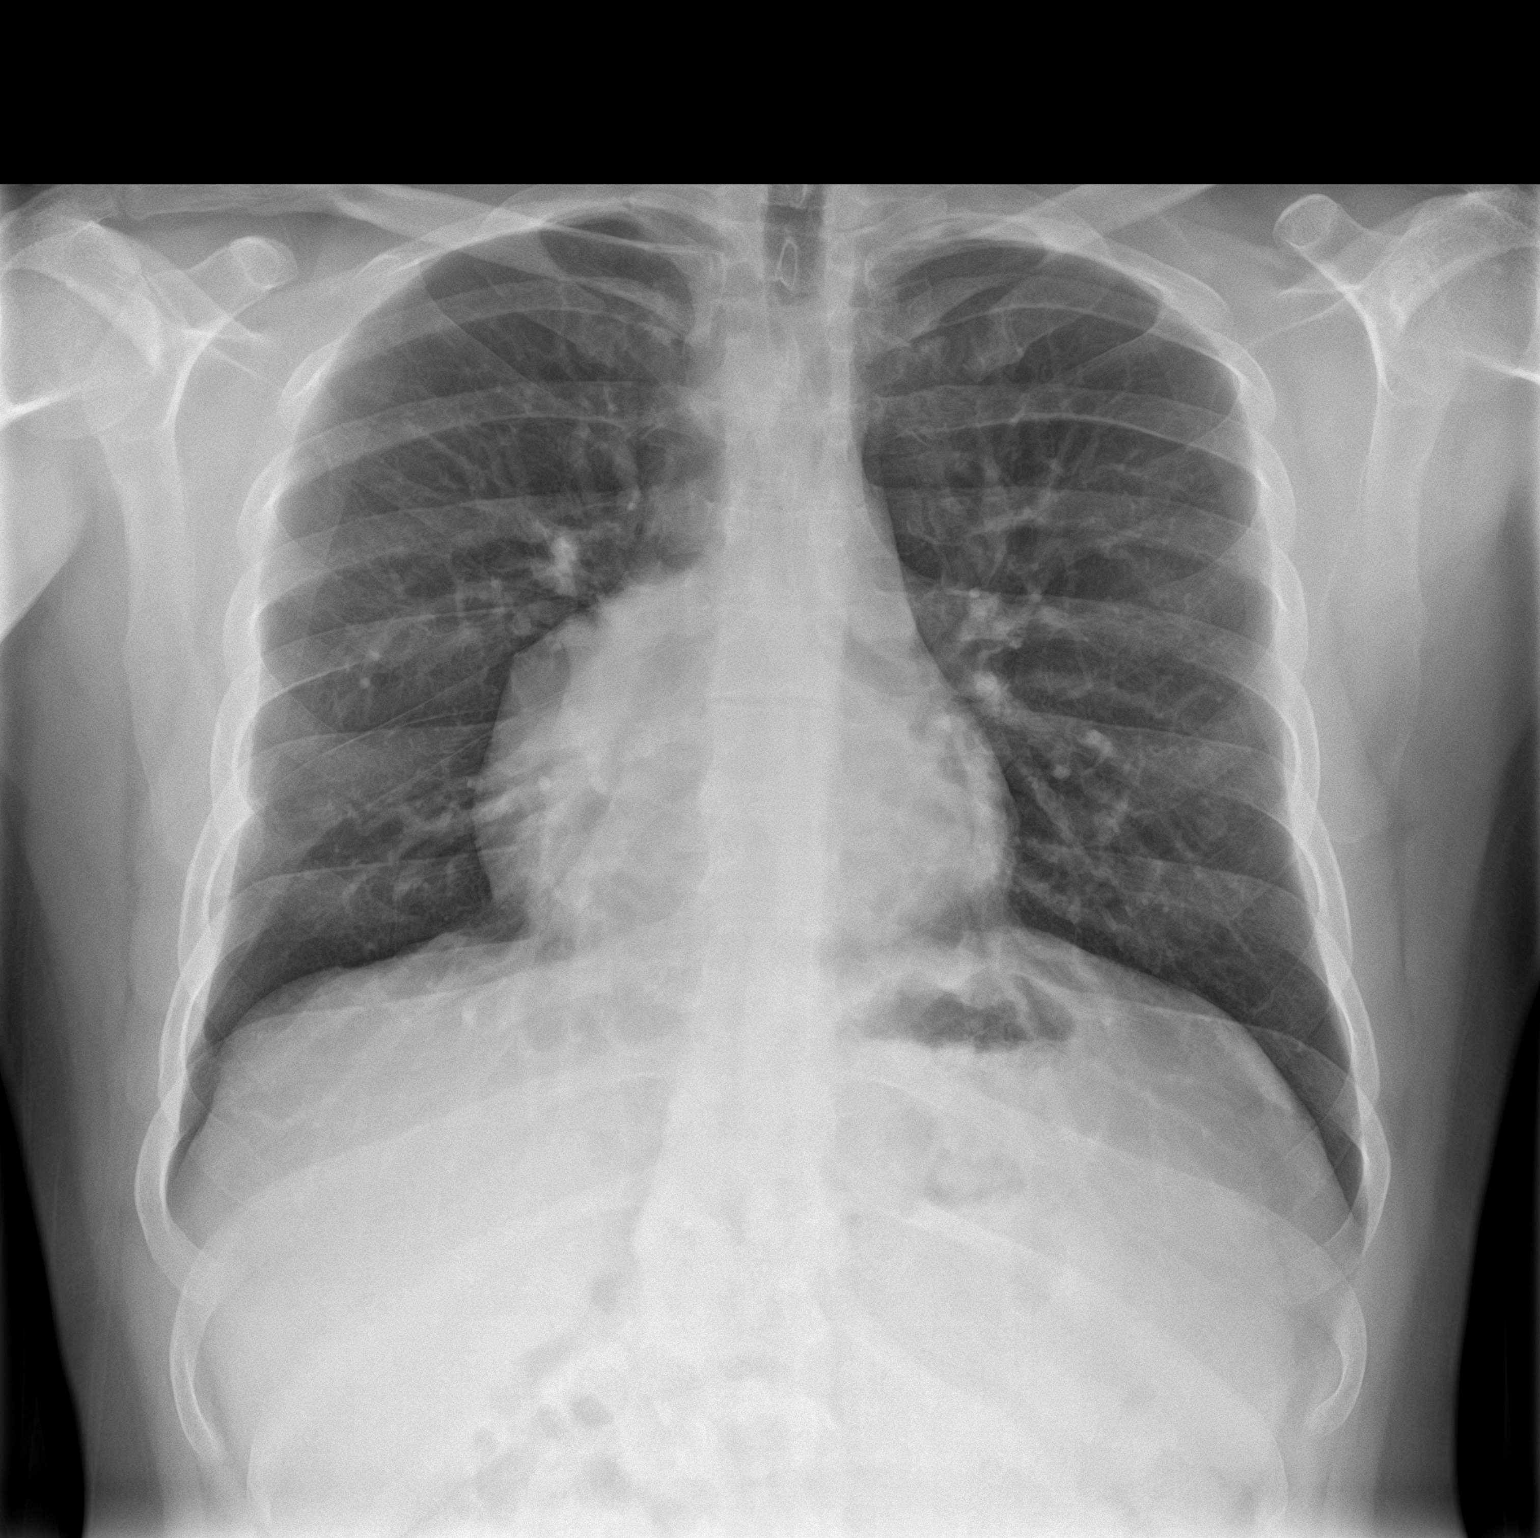

[chest lat]
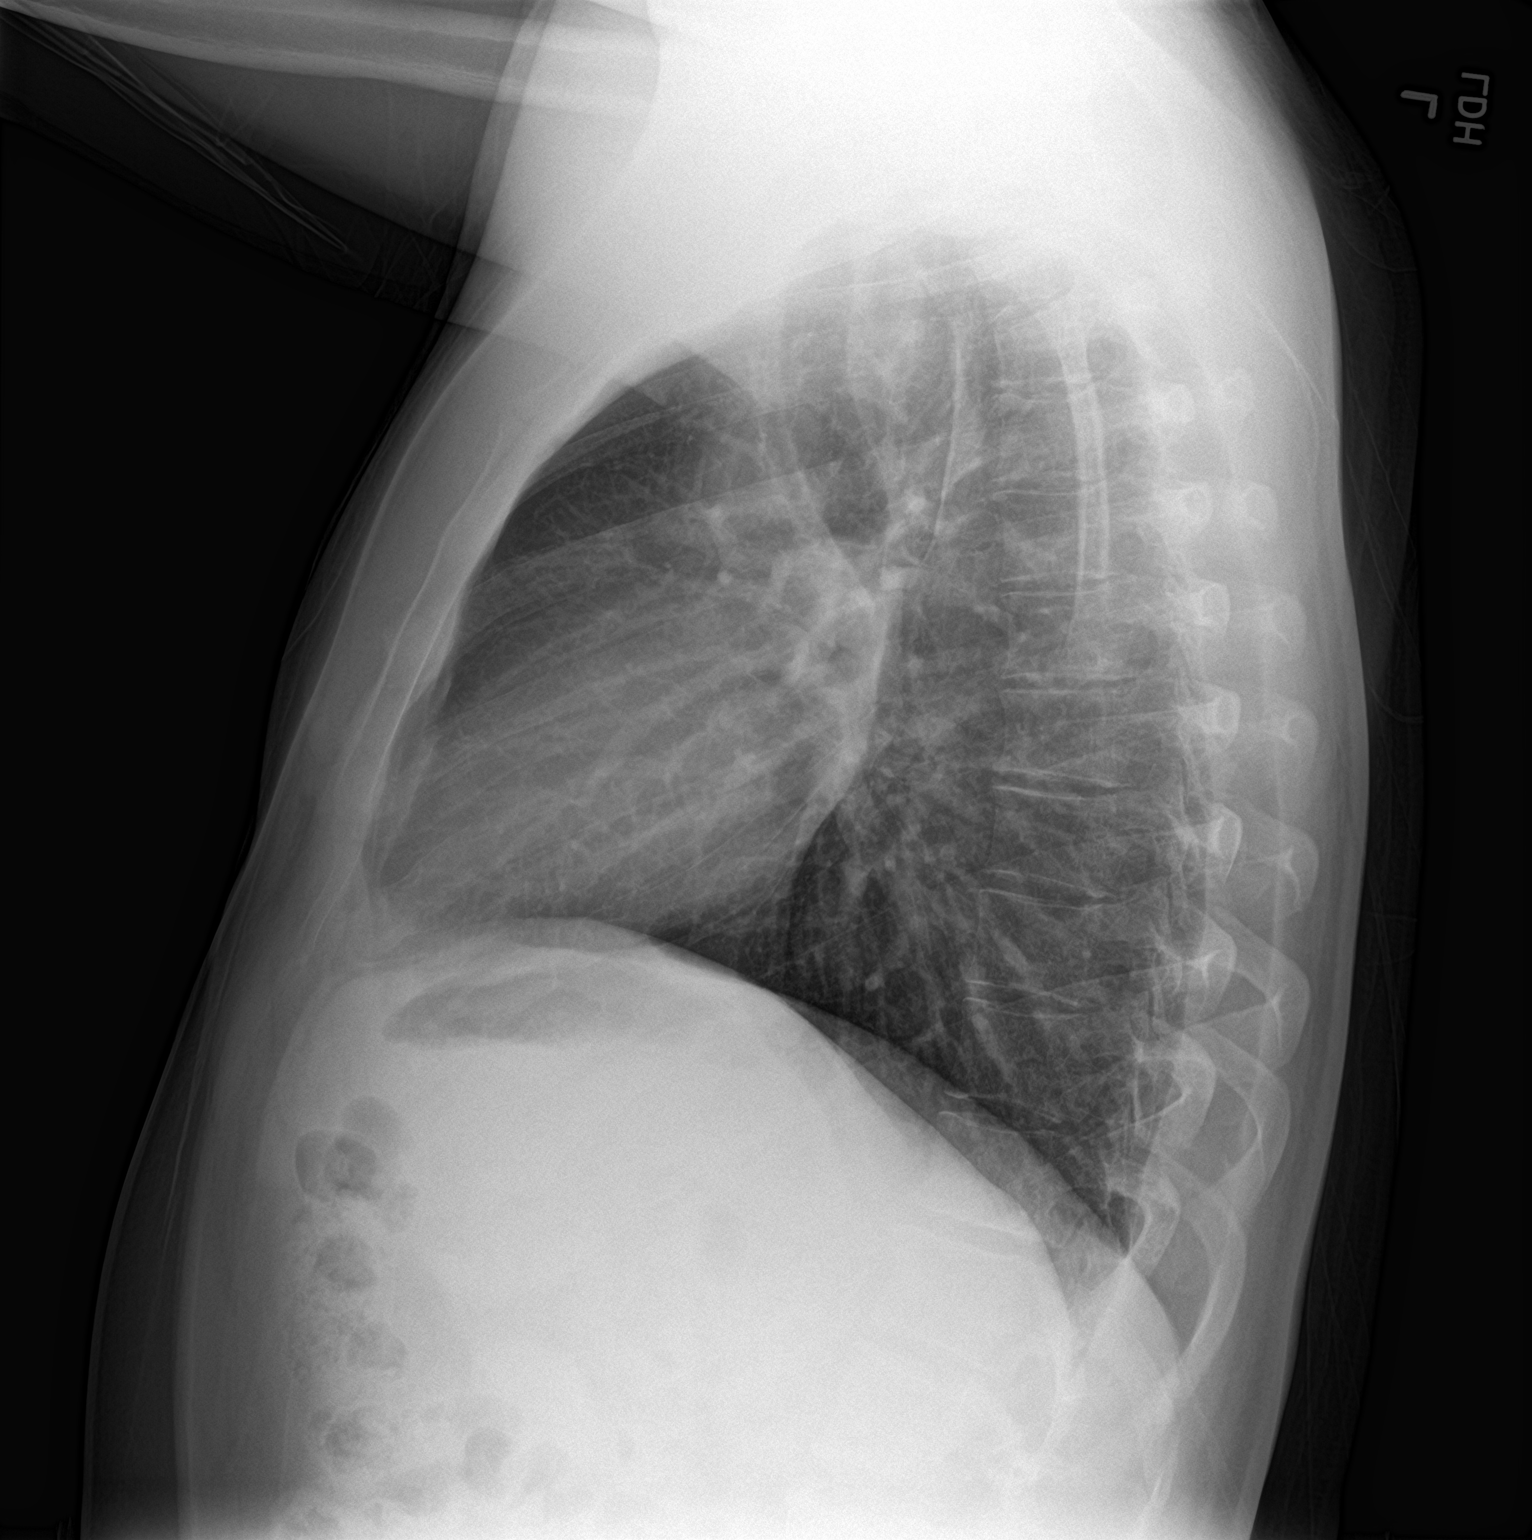

[2 of 2 positions shown; findings below may reference images not displayed]

FINDINGS: Prominent right convexity of the heart which is likely due to
positioning based on abdominal CT from earlier this year which
partially covers the mediastinum. There is a right aortic arch.
There is no edema, consolidation, effusion, or pneumothorax.
IMPRESSION: 1. No acute finding.
2. Right aortic arch.

## 2022-12-24 ENCOUNTER — Other Ambulatory Visit (HOSPITAL_COMMUNITY): Payer: Self-pay | Admitting: PEDIATRIC MEDICINE

## 2022-12-24 DIAGNOSIS — M25571 Pain in right ankle and joints of right foot: Secondary | ICD-10-CM

## 2023-01-26 ENCOUNTER — Other Ambulatory Visit (HOSPITAL_BASED_OUTPATIENT_CLINIC_OR_DEPARTMENT_OTHER): Payer: Self-pay | Admitting: Specialist

## 2023-01-26 DIAGNOSIS — S99911D Unspecified injury of right ankle, subsequent encounter: Secondary | ICD-10-CM

## 2023-01-27 ENCOUNTER — Ambulatory Visit (HOSPITAL_BASED_OUTPATIENT_CLINIC_OR_DEPARTMENT_OTHER)
Admission: RE | Admit: 2023-01-27 | Discharge: 2023-01-27 | Disposition: A | Discharge status: Home / Self Care | Payer: 59 | Source: Ambulatory Visit | Admitting: Radiology

## 2023-01-27 ENCOUNTER — Ambulatory Visit: Payer: Medicaid Other | Attending: Specialist | Admitting: Specialist

## 2023-01-27 ENCOUNTER — Other Ambulatory Visit: Payer: Self-pay

## 2023-01-27 ENCOUNTER — Encounter (HOSPITAL_BASED_OUTPATIENT_CLINIC_OR_DEPARTMENT_OTHER): Payer: Self-pay | Admitting: Specialist

## 2023-01-27 VITALS — Resp 16 | Ht 64.0 in

## 2023-01-27 DIAGNOSIS — S99911D Unspecified injury of right ankle, subsequent encounter: Secondary | ICD-10-CM | POA: Insufficient documentation

## 2023-01-27 DIAGNOSIS — S86391A Other injury of muscle(s) and tendon(s) of peroneal muscle group at lower leg level, right leg, initial encounter: Secondary | ICD-10-CM

## 2023-01-27 DIAGNOSIS — M25571 Pain in right ankle and joints of right foot: Secondary | ICD-10-CM | POA: Insufficient documentation

## 2023-01-27 DIAGNOSIS — M7671 Peroneal tendinitis, right leg: Secondary | ICD-10-CM | POA: Insufficient documentation

## 2023-01-27 NOTE — Progress Notes (Signed)
[facility] MEDICINE    Cody Horn  30-Nov-1985  [id number]    This note will be dictated.     Past medical history, past surgical history, family history, social history, medications, allergies and review of systems were gone over with the patient and noted on the chart.     PHYSICAL EXAMINATION: Throughout the physical examination, the patient was noted to be alert, awake, oriented and relaxed. Examination of ankle jerk reflexes was within normal limits. Tests of coordination reveal normal rapid alternating movements. Inspection of the foot reveals no significant atrophy. Normal tone was noted. Lower extremities were without significant malalignment. Sensation was intact throughout. Strength was tested in four planes and was within normal limits.  Has good dorsalis pedis and tibialis posterior pulses. The toes are pink and warm with less than capillary refill.     For surgery patients: The surgery was discussed at length with the patient. Alternatives to treatment were discussed as well. The risks of the planned procedure include, but are not limited to the possibility of infection, nerve damage, tendon damage, wound healing problems, incomplete relief of pain, and need for specialized footwear in the future. The risks of smoking were discussed as well. The postoperative course was discussed with the patient. Informed consent was obtained. All questions were answered, and we told the patient to call before surgery if they had any questions about the operation.     Surgery patients were ordered a CBC,BMP/CMP and EKG.
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
## 2023-02-19 ENCOUNTER — Other Ambulatory Visit: Payer: Self-pay

## 2023-02-19 ENCOUNTER — Emergency Department (HOSPITAL_COMMUNITY): Payer: BC Managed Care – PPO

## 2023-02-19 ENCOUNTER — Emergency Department
Admission: EM | Admit: 2023-02-19 | Discharge: 2023-02-20 | Disposition: A | Discharge status: Home / Self Care | Payer: BC Managed Care – PPO | Attending: Family | Admitting: Family

## 2023-02-19 ENCOUNTER — Encounter (HOSPITAL_COMMUNITY): Payer: Self-pay

## 2023-02-19 DIAGNOSIS — M25579 Pain in unspecified ankle and joints of unspecified foot: Secondary | ICD-10-CM

## 2023-02-19 DIAGNOSIS — M25571 Pain in right ankle and joints of right foot: Secondary | ICD-10-CM | POA: Insufficient documentation

## 2023-02-19 MED ORDER — HYDROCODONE 5 MG-ACETAMINOPHEN 325 MG TABLET
1.0000 | ORAL_TABLET | ORAL | Status: AC
Start: 2023-02-20 — End: 2023-02-19
  Administered 2023-02-19: 1 via ORAL
  Filled 2023-02-19: qty 1
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
## 2023-02-20 NOTE — ED Nurses Note (Signed)

## 2023-04-19 ENCOUNTER — Ambulatory Visit
Admission: RE | Admit: 2023-04-19 | Discharge: 2023-04-19 | Disposition: A | Discharge status: Home / Self Care | Payer: Self-pay | Source: Ambulatory Visit | Attending: Physician Assistant | Admitting: Physician Assistant

## 2023-04-19 ENCOUNTER — Other Ambulatory Visit: Payer: Self-pay

## 2023-04-19 DIAGNOSIS — M7671 Peroneal tendinitis, right leg: Secondary | ICD-10-CM | POA: Insufficient documentation

## 2023-04-20 ENCOUNTER — Other Ambulatory Visit (HOSPITAL_BASED_OUTPATIENT_CLINIC_OR_DEPARTMENT_OTHER): Payer: Self-pay | Admitting: Specialist

## 2023-04-20 ENCOUNTER — Ambulatory Visit (HOSPITAL_COMMUNITY)

## 2023-04-20 ENCOUNTER — Ambulatory Visit: Payer: Self-pay | Attending: Specialist | Admitting: Specialist

## 2023-04-20 ENCOUNTER — Encounter (HOSPITAL_BASED_OUTPATIENT_CLINIC_OR_DEPARTMENT_OTHER): Payer: Self-pay | Admitting: Specialist

## 2023-04-20 VITALS — Ht 64.0 in | Wt 179.0 lb

## 2023-04-20 DIAGNOSIS — M7671 Peroneal tendinitis, right leg: Secondary | ICD-10-CM

## 2023-04-20 DIAGNOSIS — Z01818 Encounter for other preprocedural examination: Secondary | ICD-10-CM | POA: Insufficient documentation

## 2023-04-20 DIAGNOSIS — M19071 Primary osteoarthritis, right ankle and foot: Secondary | ICD-10-CM

## 2023-04-20 DIAGNOSIS — S86319A Strain of muscle(s) and tendon(s) of peroneal muscle group at lower leg level, unspecified leg, initial encounter: Secondary | ICD-10-CM

## 2023-04-20 DIAGNOSIS — M25371 Other instability, right ankle: Secondary | ICD-10-CM

## 2023-04-20 DIAGNOSIS — S86311A Strain of muscle(s) and tendon(s) of peroneal muscle group at lower leg level, right leg, initial encounter: Secondary | ICD-10-CM

## 2023-04-20 DIAGNOSIS — M25372 Other instability, left ankle: Secondary | ICD-10-CM

## 2023-04-20 LAB — BASIC METABOLIC PANEL
ANION GAP: 8 mmol/L (ref 4–13)
BUN/CREA RATIO: 14 (ref 6–22)
BUN: 14 mg/dL (ref 8–25)
CALCIUM: 9.8 mg/dL (ref 8.6–10.2)
CHLORIDE: 108 mmol/L (ref 96–111)
CO2 TOTAL: 23 mmol/L (ref 22–30)
CREATININE: 0.98 mg/dL (ref 0.75–1.35)
ESTIMATED GFR - MALE: >90 mL/min/BSA (ref >=60)
GLUCOSE: 105 mg/dL (ref 65–125)
POTASSIUM: 4.2 mmol/L (ref 3.5–5.1)
SODIUM: 139 mmol/L (ref 136–145)

## 2023-04-20 LAB — CBC WITH DIFF
BASOPHIL #: <0.1 10*3/uL (ref <=0.20)
BASOPHIL %: 1 %
EOSINOPHIL #: 0.15 10*3/uL (ref <=0.50)
EOSINOPHIL %: 1.8 %
HCT: 47.1 % (ref 38.9–52.0)
HGB: 15.8 g/dL (ref 13.4–17.5)
IMMATURE GRANULOCYTE #: <0.1 10*3/uL (ref <0.10)
IMMATURE GRANULOCYTE %: 0.7 % (ref 0.0–1.0)
LYMPHOCYTE #: 1.89 10*3/uL (ref 1.00–4.80)
LYMPHOCYTE %: 22.7 %
MCH: 29.5 pg (ref 26.0–32.0)
MCHC: 33.5 g/dL (ref 31.0–35.5)
MCV: 88 fL (ref 78.0–100.0)
MONOCYTE #: 0.7 10*3/uL (ref 0.20–1.10)
MONOCYTE %: 8.4 %
MPV: 9.4 fL (ref 8.7–12.5)
NEUTROPHIL #: 5.44 10*3/uL (ref 1.50–7.70)
NEUTROPHIL %: 65.4 %
PLATELETS: 278 10*3/uL (ref 150–400)
RBC: 5.35 10*6/uL (ref 4.50–6.10)
RDW-CV: 12.4 % (ref 11.5–15.5)
WBC: 8.3 10*3/uL (ref 3.7–11.0)

## 2023-04-21 NOTE — Progress Notes (Signed)
 PATIENT NAME: Cody Horn, Cody Horn   HOSPITAL NUMBER:  [id number]  DATE OF SERVICE: 04/20/2023  DATE OF BIRTH:  May 05, 1985    PROGRESS NOTE.    SUBJECTIVE:  Kalonji came in for followup after his MRI of his ankle.  His complaints are unchanged.  My exam is previously entirely unchanged.  He said the brace helped for a while.  He said actually it just kind of dug into the lateral ankle which made it more uncomfortable, however, it did help him with some of his symptoms.  He came in for followup.    OBJECTIVE:  Today when I examined him, my exam is entirely unchanged.    ASSESSMENT AND PLAN:  I went over his MRI in detail with him and his wife present.  I basically showed him he has subluxing peroneal tendons.  He has a tear in the peroneus brevis tear.  It is a long split tear.  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  Today, I discussed with him operative and nonoperative interventions.  He wants to proceed with surgery.  The surgery actually will be a right ankle arthroscopic debridement, lateral ligament reconstruction using an internal brace.  We will look at the peroneal tendon.  We will have to do a repair of the subluxing peroneal tendons, possible groove deepening procedure.  If we do this to get his peroneal tendons back behind his ankle, we would do a repair of the split in the peroneal longus tendon.  I told him this will put him nonweightbearing for 3 weeks in a splint followed by 3 weeks full weightbearing in a boot.  After that, he will do a BOA brace and physical therapy.  He is going to schedule sometime in the future.    The surgery was discussed at length with the patient. Alternatives to treatment were discussed as well. The risks of the planned procedure include, but are not limited to the possibility of infection, nerve damage, tendon damage, wound healing problems, incomplete relief of pain, and need for specialized footwear in the future. The risks  of smoking were discussed as well. The postoperative course was discussed with the patient. Informed consent was obtained. All questions were answered, and we told the patient to call before surgery if they had any questions about the operation. CBC, BMP/CMP and EKG were ordered.  Indication.        Lester Carolina, MD                DD:  04/20/2023 17:03:43  DT:  04/21/2023 18:32:49 FP  D#:  [id number]

## 2023-05-02 ENCOUNTER — Telehealth (HOSPITAL_BASED_OUTPATIENT_CLINIC_OR_DEPARTMENT_OTHER): Payer: Self-pay | Admitting: Specialist

## 2023-05-02 NOTE — Telephone Encounter (Signed)
 Spoke to patient regarding missing PCP clearance that is required for surgery with Dr. Allan Ishihara on 05/17/23. Patient states he has an appt scheduled on 05/04/23 and will have it faxed over. Fax number confirmed.       Ludivina Safe, BSN, RN   Orthopaedic Nurse Navigator  [facility]

## 2023-05-09 ENCOUNTER — Telehealth (HOSPITAL_BASED_OUTPATIENT_CLINIC_OR_DEPARTMENT_OTHER): Payer: Self-pay | Admitting: Specialist

## 2023-05-09 NOTE — Telephone Encounter (Signed)
 Spoke to patient regarding missing PCP clearance that is required for surgery with Dr. Allan Ishihara on 05/17/23. Patient states he completed visit and will reach out to he PCP's office to fax it over. Patient states he has our fax number. Will f/u accordingly.       Ludivina Safe, BSN, RN   Orthopaedic Nurse Navigator  [facility]

## 2023-05-11 ENCOUNTER — Encounter (HOSPITAL_COMMUNITY): Payer: Self-pay | Admitting: Specialist

## 2023-05-12 ENCOUNTER — Other Ambulatory Visit (HOSPITAL_COMMUNITY): Payer: Self-pay | Admitting: Specialist

## 2023-05-12 ENCOUNTER — Ambulatory Visit: Attending: Specialist

## 2023-05-12 ENCOUNTER — Other Ambulatory Visit: Payer: Self-pay

## 2023-05-12 ENCOUNTER — Other Ambulatory Visit (HOSPITAL_BASED_OUTPATIENT_CLINIC_OR_DEPARTMENT_OTHER): Payer: Self-pay | Admitting: Specialist

## 2023-05-12 DIAGNOSIS — Z01818 Encounter for other preprocedural examination: Secondary | ICD-10-CM

## 2023-05-12 NOTE — Nursing Note (Signed)
 Patient presented to the office for a pre op EKG. EKG complete. No intervention needed at this time.    Mellissa Kohut, RN

## 2023-05-12 NOTE — Discharge Instructions (Signed)
 MAJOR FOOT/ANKLE PROCEDURE   WHAT TO EXPECT AT HOME  DR. HASSELMAN     YOUR RECOVERY     This care sheet is a general guide on how to best care for yourself following surgery. Everyone recovers at a different pace, however, following the steps below will help to ensure you recover as quickly and safely as possible. Please note, it is normal to expect your ankle/foot to feel stiff and sore around your incision areas after surgery.     INSTRUCTIONS & CARE AT HOME:     Follow up care is a key part of your treatment and safety. Be sure to schedule and attend all appointments. Please call your doctor if you are experiencing problems.      ACTIVITY          NON-WEIGHTBEARING on the operative leg. Must use crutches, walker or knee scooter at all times for the first 2-3 weeks after surgery.          Strict elevation of operative leg above heart/hip level at all times with the exception of meals and when you are using the bathroom.           Elevation helps to reduce both pain and swelling and is a key factor in your recovery.           NO DRIVING     DIET           Resume your regular diet as tolerated.          Drink plenty of water.           High fiber foods, such as fresh fruit and green vegetables, help reduce constipation that may occur from taking pain medications.      MEDICATIONS           Resume your regular medications as instructed.           Take pain medication only as prescribed. See below for details.           For constipation, you may take an over-the-counter stool softener (such as MiraLAX) as needed.           IMPORTANT while Non-Weightbearing: If you are not already taking anticoagulants/blood thinners such as Coumadin, Pradaxa, Eliquis, Xarelto, Lovenox, Arixtra, etc.- then you must take a baby aspirin 81 mg twice a day until your first post-op visit to help prevent blood clots.     Please note: We will not refill pain medication on the weekend or after 3 pm Monday-Thursday or after 12 noon  on Fridays     INCISION CARE           It is normal to have some bloody drainage on your dressing and/or splint postoperatively. Please call if you notice continuous, increased, or unusual (color/smell) drainage.           Keep your post op dressing/splint clean, dry and intact until your first post op appointment which is usually 3 weeks after surgery. DO NOT REMOVE.           DO NOT get your dressing/splint wet or insert anything into it.      Please call the office with any questions or concerns regarding your dressing/splint.     EXERCISE          You will not start any formal therapy/exercise until your surgeon instructs you to do so.      ICE AND ELEVATION  While awake, please use ice packs behind the knee continuously for the first 24-72hrs and keep your dressing/splint dry.           After this period, you may continue to ice behind the knee throughout the day to help reduce pain and swelling.           Elevation helps to reduce both pain and swelling and is an important factor in your recovery.      OTHER INSTRUCTIONS          No alcoholic beverages 24hrs after surgery or while taking pain medication.           No driving or operating complex machinery until advised to do so or while taking pain medication.           Do not make important decisions for 24 hours after surgery or while taking pain medications.      WHEN TO CALL FOR HELP?     CALL 911- ANYTIME you think you may need emergency care. Call or seek immediate medical attention if:          You passed out (lost consciousness).          You have chest pain, are short of breath, or you cough up blood.      CALL YOUR SURGEON- NOW or seek immediate medical care if:           Chills or fever of 102 degrees or above.          New, excessive, or unusual drainage on your dressing/splint.          Swelling, pain, redness, and/or warmth in the calf of your operative leg.           Uncontrollable pain that is not relieved by taking  prescribed pain medication, elevating your leg, or applying ice.           Any increased discoloration, numbness or tingling in the operative leg. (You will see your toes/foot/ankle will go reddish/purple if the foot is hanging down- this is a normal phenomenon. Elevate the leg for 15 minutes and this should go away).          After you are permitted to change your dressing, please call if you experience any redness, swelling or draining from incisions.      Call our office at [phone] during office hours. After hours, call the [facility] at [phone]     Please note: We will not refill pain medication on the weekend or after 3 pm Monday-Thursday or after 12 noon on Fridays        POST-SURGERY PAIN RELIEF  Dr. Natale Lay     Discomfort/pain is a normal and expected outcome of surgery. The goal is to minimize pain. Please understand a pain level of 0 is not a realistic goal immediately following surgery.   You may take Acetaminophen (Tylenol) 1000mg  3 times a day. We prefer you do not take NSAIDs, such as Ibuprofen, for 10 days after surgery (unless we have prescribed you Mobic). After 10 days, consider adding over-the-counter Ibuprofen (Advil/Motrin) in between the Tylenol doses as needed, in 3-hour increments (i.e. Take Tylenol and wait 3 hours, then take Advil/Motrin/Mobic and wait 3 hours, repeat). If you are without significant pain, medications can be taken less frequently. Additionally, do not take NSAIDs/Ibuprofen if you are on a prescribed blood thinner such as Coumadin, Pradaxa, Eliquis, Xarelto, Lovenox, or Arixtra. Do not take any additional NSAIDs/Ibuprofen  if you have been prescribed Mobic. You may take ibuprofen while on a low-dose Aspirin.   For severe pain, a narcotic/opioid medicine may be prescribed for a short period of time (usually Oxycodone/Roxicodone). Please take all narcotic medications only as prescribed. Tylenol may be taken with Oxycodone since it does not contain  acetaminophen in it. IF you are prescribed a narcotic that already contains acetaminophen in it (Percocet/Vicodin), do not take additional Tylenol with it. There are no negative interactions between the prescribed narcotics and ibuprofen, so they can be taken at the same time. You may also be prescribed Gabapentin (Neurontin) 300mg  3 times a day for a longer period. This is not a narcotic and is prescribed to help manage pain and reduce narcotic/opioid use.   Our referred method for minimizing post-operative discomfort is to take Gabapentin and alternate Tylenol and Ibuprofen (as applicable) every 3 hours. The narcotic pain medication should be used for supplement breakthrough pain, not as the primary method of pain relief. Additionally, please adhere to a strict schedule of elevating and ice behind the knee as this is an essential step in pain relief. Pain or discomfort following surgery should progressively subside with each day, however, if you received a regional nerve block you may not begin noticing pain until the block wears off (usually 1-7 days after surgery). This is normal, please follow the pain management routine advised above.  If, however, you experience uncontrollable pain, please call our office at [phone] during office hours. After hours, call the [facility] at [phone].    Please note: We will not refill pain medication on the weekend or after 3 pm Monday-Thursday or after 12 noon on Fridays.

## 2023-05-15 LAB — ECG 12-LEAD
Atrial Rate: 94 {beats}/min
Calculated P Axis: 39 degrees
Calculated R Axis: 0 degrees
Calculated T Axis: 40 degrees
PR Interval: 128 ms
QRS Duration: 90 ms
QT Interval: 334 ms
QTC Calculation: 417 ms
Ventricular rate: 94 {beats}/min

## 2023-05-16 ENCOUNTER — Telehealth (HOSPITAL_BASED_OUTPATIENT_CLINIC_OR_DEPARTMENT_OTHER): Payer: Self-pay | Admitting: Specialist

## 2023-05-16 NOTE — Telephone Encounter (Signed)
 Spoke to patient directly. Patient notified of the following information for their surgery on 05/17/23:    - Surgery is scheduled for 1130. Please arrive to the hospital on the 2nd floor at 0930 (2hrs prior to surgical time).   - Must have transportation home after surgery.   - Nothing to eat or drink after midnight. This includes no smoking, no tobacco products, no vaping, no gum. NO GATORADE/ELECTROLYTE DRINK.    Patient stated understanding and had no further questions.

## 2023-05-17 ENCOUNTER — Ambulatory Visit (HOSPITAL_COMMUNITY)
Admission: RE | Admit: 2023-05-17 | Discharge: 2023-05-17 | Disposition: A | Discharge status: Home / Self Care | Source: Ambulatory Visit

## 2023-05-17 ENCOUNTER — Ambulatory Visit (HOSPITAL_COMMUNITY): Admitting: Anesthesiology

## 2023-05-17 ENCOUNTER — Ambulatory Visit (HOSPITAL_COMMUNITY)

## 2023-05-17 ENCOUNTER — Ambulatory Visit (HOSPITAL_COMMUNITY): Admitting: Specialist

## 2023-05-17 ENCOUNTER — Encounter (HOSPITAL_COMMUNITY): Payer: Self-pay | Admitting: Specialist

## 2023-05-17 ENCOUNTER — Other Ambulatory Visit: Payer: Self-pay

## 2023-05-17 ENCOUNTER — Encounter (HOSPITAL_COMMUNITY)
Admission: RE | Disposition: A | Discharge status: Home / Self Care | Payer: Self-pay | Source: Ambulatory Visit | Attending: Specialist

## 2023-05-17 ENCOUNTER — Other Ambulatory Visit (HOSPITAL_BASED_OUTPATIENT_CLINIC_OR_DEPARTMENT_OTHER): Payer: Self-pay | Admitting: Specialist

## 2023-05-17 ENCOUNTER — Ambulatory Visit (HOSPITAL_BASED_OUTPATIENT_CLINIC_OR_DEPARTMENT_OTHER): Admitting: Anesthesiology

## 2023-05-17 ENCOUNTER — Ambulatory Visit
Admission: RE | Admit: 2023-05-17 | Discharge: 2023-05-17 | Disposition: A | Discharge status: Home / Self Care | Payer: Self-pay | Source: Ambulatory Visit | Attending: Specialist | Admitting: Specialist

## 2023-05-17 DIAGNOSIS — M25511 Pain in right shoulder: Secondary | ICD-10-CM

## 2023-05-17 DIAGNOSIS — S86391A Other injury of muscle(s) and tendon(s) of peroneal muscle group at lower leg level, right leg, initial encounter: Secondary | ICD-10-CM

## 2023-05-17 DIAGNOSIS — M25371 Other instability, right ankle: Secondary | ICD-10-CM

## 2023-05-17 DIAGNOSIS — S86311A Strain of muscle(s) and tendon(s) of peroneal muscle group at lower leg level, right leg, initial encounter: Secondary | ICD-10-CM

## 2023-05-17 DIAGNOSIS — F32A Depression, unspecified: Secondary | ICD-10-CM | POA: Insufficient documentation

## 2023-05-17 DIAGNOSIS — M24471 Recurrent dislocation, right ankle: Secondary | ICD-10-CM

## 2023-05-17 DIAGNOSIS — R0683 Snoring: Secondary | ICD-10-CM | POA: Insufficient documentation

## 2023-05-17 DIAGNOSIS — G8918 Other acute postprocedural pain: Secondary | ICD-10-CM

## 2023-05-17 DIAGNOSIS — S86311S Strain of muscle(s) and tendon(s) of peroneal muscle group at lower leg level, right leg, sequela: Secondary | ICD-10-CM

## 2023-05-17 DIAGNOSIS — I10 Essential (primary) hypertension: Secondary | ICD-10-CM | POA: Insufficient documentation

## 2023-05-17 DIAGNOSIS — M19171 Post-traumatic osteoarthritis, right ankle and foot: Secondary | ICD-10-CM

## 2023-05-17 DIAGNOSIS — M19071 Primary osteoarthritis, right ankle and foot: Secondary | ICD-10-CM

## 2023-05-17 DIAGNOSIS — R52 Pain, unspecified: Secondary | ICD-10-CM

## 2023-05-17 SURGERY — ARTHROSCOPY ANKLE
Anesthesia: Monitor Anesthesia Care | Laterality: Right | Wound class: Clean Wound: Uninfected operative wounds in which no inflammation occurred

## 2023-05-17 MED ORDER — MIDAZOLAM 1 MG/ML INJECTION WRAPPER
2.0000 mg | Freq: Once | INTRAMUSCULAR | Status: DC | PRN
Start: 2023-05-17 — End: 2023-05-17
  Administered 2023-05-17: 2 mg via INTRAVENOUS
  Filled 2023-05-17: qty 2

## 2023-05-17 MED ORDER — MIDAZOLAM 1 MG/ML INJECTION WRAPPER
Freq: Once | INTRAMUSCULAR | Status: DC | PRN
Start: 2023-05-17 — End: 2023-05-17
  Administered 2023-05-17: 2 mg via INTRAVENOUS

## 2023-05-17 MED ORDER — FENTANYL (PF) 50 MCG/ML INJECTION WRAPPER
INJECTION | INTRAMUSCULAR | Status: AC
Start: 2023-05-17 — End: 2023-05-17
  Filled 2023-05-17: qty 2

## 2023-05-17 MED ORDER — PROCHLORPERAZINE EDISYLATE 10 MG/2 ML (5 MG/ML) INJECTION SOLUTION
5.0000 mg | Freq: Once | INTRAMUSCULAR | Status: DC | PRN
Start: 2023-05-17 — End: 2023-05-17

## 2023-05-17 MED ORDER — GABAPENTIN 300 MG CAPSULE
300.0000 mg | ORAL_CAPSULE | Freq: Once | ORAL | Status: AC
Start: 2023-05-17 — End: 2023-05-17
  Administered 2023-05-17: 300 mg via ORAL
  Filled 2023-05-17: qty 1

## 2023-05-17 MED ORDER — PROPOFOL 10 MG/ML IV BOLUS
INJECTION | Freq: Once | INTRAVENOUS | Status: DC | PRN
Start: 2023-05-17 — End: 2023-05-17
  Administered 2023-05-17: 100 mg via INTRAVENOUS

## 2023-05-17 MED ORDER — LACTATED RINGERS INTRAVENOUS SOLUTION
INTRAVENOUS | Status: DC
Start: 2023-05-17 — End: 2023-05-17

## 2023-05-17 MED ORDER — MIDAZOLAM 1 MG/ML INJECTION WRAPPER
INTRAMUSCULAR | Status: AC
Start: 2023-05-17 — End: 2023-05-17
  Filled 2023-05-17: qty 2

## 2023-05-17 MED ORDER — IPRATROPIUM 0.5 MG-ALBUTEROL 3 MG (2.5 MG BASE)/3 ML NEBULIZATION SOLN
3.0000 mL | INHALATION_SOLUTION | Freq: Once | RESPIRATORY_TRACT | Status: DC | PRN
Start: 2023-05-17 — End: 2023-05-17
  Administered 2023-05-17: 3 mL via RESPIRATORY_TRACT
  Filled 2023-05-17: qty 3

## 2023-05-17 MED ORDER — PROPOFOL 10 MG/ML IV - CHI
INTRAVENOUS | Status: DC | PRN
Start: 2023-05-17 — End: 2023-05-17
  Administered 2023-05-17: 0 ug/kg/min via INTRAVENOUS
  Administered 2023-05-17: 100 ug/kg/min via INTRAVENOUS

## 2023-05-17 MED ORDER — MELOXICAM 15 MG TABLET
15.0000 mg | ORAL_TABLET | Freq: Once | ORAL | Status: AC
Start: 2023-05-17 — End: 2023-05-17
  Administered 2023-05-17: 15 mg via ORAL
  Filled 2023-05-17: qty 1

## 2023-05-17 MED ORDER — FENTANYL (PF) 50 MCG/ML INJECTION WRAPPER
50.0000 ug | INJECTION | Freq: Once | INTRAMUSCULAR | Status: DC | PRN
Start: 2023-05-17 — End: 2023-05-17

## 2023-05-17 MED ORDER — ACETAMINOPHEN 500 MG TABLET
1000.0000 mg | ORAL_TABLET | Freq: Once | ORAL | Status: AC
Start: 2023-05-17 — End: 2023-05-17
  Administered 2023-05-17: 1000 mg via ORAL
  Filled 2023-05-17: qty 2

## 2023-05-17 MED ORDER — ASPIRIN 81 MG TABLET,DELAYED RELEASE
81.0000 mg | DELAYED_RELEASE_TABLET | Freq: Two times a day (BID) | ORAL | Status: AC
Start: 2023-05-17 — End: 2023-06-16

## 2023-05-17 MED ORDER — SODIUM CHLORIDE 0.9 % (FLUSH) INJECTION SYRINGE
3.0000 mL | INJECTION | INTRAMUSCULAR | Status: DC | PRN
Start: 2023-05-17 — End: 2023-05-17
  Administered 2023-05-17: 3 mL

## 2023-05-17 MED ORDER — DEXTROSE 5% IN WATER (D5W) FLUSH BAG - 250 ML
INTRAVENOUS | Status: DC | PRN
Start: 2023-05-17 — End: 2023-05-17

## 2023-05-17 MED ORDER — CEPHALEXIN 500 MG CAPSULE
500.0000 mg | ORAL_CAPSULE | Freq: Four times a day (QID) | ORAL | 0 refills | Status: AC
Start: 2023-05-17 — End: 2023-05-20
  Filled 2023-05-17: qty 12, 3d supply, fill #0

## 2023-05-17 MED ORDER — ONDANSETRON HCL (PF) 4 MG/2 ML INJECTION SOLUTION
4.0000 mg | Freq: Once | INTRAMUSCULAR | Status: DC | PRN
Start: 2023-05-17 — End: 2023-05-17

## 2023-05-17 MED ORDER — SODIUM CHLORIDE 0.9 % (FLUSH) INJECTION SYRINGE
3.0000 mL | INJECTION | Freq: Three times a day (TID) | INTRAMUSCULAR | Status: DC
Start: 2023-05-17 — End: 2023-05-17

## 2023-05-17 MED ORDER — PROPOFOL 10 MG/ML IV BOLUS
INJECTION | INTRAVENOUS | Status: AC
Start: 2023-05-17 — End: 2023-05-17
  Filled 2023-05-17: qty 100

## 2023-05-17 MED ORDER — ACETAMINOPHEN 500 MG TABLET
500.0000 mg | ORAL_TABLET | Freq: Three times a day (TID) | ORAL | Status: AC
Start: 2023-05-17 — End: 2023-06-16

## 2023-05-17 MED ORDER — BUPIVACAINE (PF) 0.5 % (5 MG/ML) INJECTION SOLUTION
Freq: Once | INTRAMUSCULAR | Status: AC | PRN
Start: 2023-05-17 — End: 2023-05-17
  Administered 2023-05-17: 10 mL

## 2023-05-17 MED ORDER — HYDROMORPHONE (PF) 0.5 MG/0.5 ML INJECTION SYRINGE
0.5000 mg | INJECTION | INTRAMUSCULAR | Status: DC | PRN
Start: 2023-05-17 — End: 2023-05-17

## 2023-05-17 MED ORDER — HYDROMORPHONE (PF) 0.5 MG/0.5 ML INJECTION SYRINGE
INJECTION | INTRAMUSCULAR | Status: AC
Start: 2023-05-17 — End: 2023-05-17
  Filled 2023-05-17: qty 0.5

## 2023-05-17 MED ORDER — GABAPENTIN 300 MG CAPSULE
300.0000 mg | ORAL_CAPSULE | Freq: Three times a day (TID) | ORAL | 0 refills | Status: AC
Start: 2023-05-17 — End: 2023-06-16
  Filled 2023-05-17: qty 90, 30d supply, fill #0

## 2023-05-17 MED ORDER — FENTANYL (PF) 50 MCG/ML INJECTION WRAPPER
100.0000 ug | INJECTION | Freq: Once | INTRAMUSCULAR | Status: DC | PRN
Start: 2023-05-17 — End: 2023-05-17
  Administered 2023-05-17: 100 ug via INTRAVENOUS
  Filled 2023-05-17: qty 2

## 2023-05-17 MED ORDER — HYDRALAZINE 20 MG/ML INJECTION SOLUTION
5.0000 mg | INTRAMUSCULAR | Status: DC | PRN
Start: 2023-05-17 — End: 2023-05-17

## 2023-05-17 MED ORDER — BUPIVACAINE LIPOSOME(PF) 1.3 %(13.3 MG/ML) SUSPENSION FOR INFILTRATION
10.0000 mL | Freq: Once | Status: DC
Start: 2023-05-17 — End: 2023-05-17
  Filled 2023-05-17: qty 10

## 2023-05-17 MED ORDER — MIDAZOLAM 1 MG/ML INJECTION WRAPPER
1.0000 mg | Freq: Once | INTRAMUSCULAR | Status: DC | PRN
Start: 2023-05-17 — End: 2023-05-17

## 2023-05-17 MED ORDER — ONDANSETRON 4 MG DISINTEGRATING TABLET
4.0000 mg | ORAL_TABLET | Freq: Three times a day (TID) | ORAL | 0 refills | Status: AC | PRN
Start: 2023-05-17 — End: ?
  Filled 2023-05-17: qty 9, 3d supply, fill #0

## 2023-05-17 MED ORDER — SODIUM CHLORIDE 0.9% FLUSH BAG - 250 ML
INTRAVENOUS | Status: DC | PRN
Start: 2023-05-17 — End: 2023-05-17

## 2023-05-17 MED ORDER — SODIUM CHLORIDE 0.9 % IRRIGATION SOLUTION
Freq: Once | Status: DC | PRN
Start: 2023-05-17 — End: 2023-05-17
  Administered 2023-05-17: 500 mL

## 2023-05-17 MED ORDER — FENTANYL (PF) 50 MCG/ML INJECTION WRAPPER
INJECTION | Freq: Once | INTRAMUSCULAR | Status: DC | PRN
Start: 2023-05-17 — End: 2023-05-17
  Administered 2023-05-17 (×2): 50 ug via INTRAVENOUS

## 2023-05-17 MED ORDER — OXYCODONE 5 MG TABLET
5.0000 mg | ORAL_TABLET | ORAL | 0 refills | Status: AC | PRN
Start: 2023-05-17 — End: 2023-05-20
  Filled 2023-05-17: qty 18, 3d supply, fill #0

## 2023-05-17 MED ORDER — PROPOFOL 10 MG/ML IV BOLUS
INJECTION | INTRAVENOUS | Status: AC
Start: 2023-05-17 — End: 2023-05-17
  Filled 2023-05-17: qty 50

## 2023-05-17 MED ORDER — FENTANYL (PF) 50 MCG/ML INJECTION WRAPPER
25.0000 ug | INJECTION | INTRAMUSCULAR | Status: DC | PRN
Start: 2023-05-17 — End: 2023-05-17

## 2023-05-17 MED ORDER — BUPIVACAINE LIPOSOME(PF) 1.3 %(13.3 MG/ML) SUSPENSION FOR INFILTRATION
Freq: Once | Status: AC | PRN
Start: 2023-05-17 — End: 2023-05-17
  Administered 2023-05-17: 10 mL

## 2023-05-17 MED ORDER — CEFAZOLIN 2 GRAM INJECTION - 100MG/ML FOR IV PUSH
2.0000 g | Freq: Once | INTRAMUSCULAR | Status: AC
Start: 2023-05-17 — End: 2023-05-17
  Administered 2023-05-17: 2 g via INTRAVENOUS
  Filled 2023-05-17: qty 6.06

## 2023-05-17 SURGICAL SUPPLY — 34 items
ANCHOR SUT 3.5MM SWIVELOCK SHLDR BIOCOMP 15.8MM ×1 IMPLANT
ANCHOR SUT ARTHX DX FIBERTAK NEEDLE STRL LF ×2 IMPLANT
APPL 70% ISPRP 2% CHG 26ML CHLRPRP HI-LT ORNG PREP STRL LF  DISP CLR (MED SURG SUPPLIES) ×1 IMPLANT
BANDAGE ESMARK 9FTX4IN STRL SYN COMPRESS LF (WOUND CARE SUPPLY) ×1 IMPLANT
BANDAGE HOOK LOCK 5YDX6IN NONST 2 HOOK CLSR KNIT ELAS LPLK PLSTR COTTON COMPRESS LF  REUSE (WOUND CARE SUPPLY) ×1 IMPLANT
BLADE SHAVER 7CM 3.5MM COOLCUT TORPEDO SM JNT DISP (ENDOSCOPIC SUPPLIES) ×2 IMPLANT
BURR SHAVER 3MM OVAL 10 FLUTE (ENDOSCOPIC SUPPLIES) IMPLANT
COVER RIGID STRL LF  CNVRT LIGHT HNDL PLASTIC DISP GRN (MED SURG SUPPLIES) ×1 IMPLANT
CUFF TOURNIQUET RD 18X4IN COLOR CUF CYL 2 PORT BLADDER QC LOW PROF STRL LF  DISP (MED SURG SUPPLIES) ×1 IMPLANT
CUFF TOURNIQUET YW 24X4IN COLOR CUF CYL 2 PORT 1 BLADDER QC LOW PROF 40IN STRL LF  DISP (MED SURG SUPPLIES) IMPLANT
CUP MED 2OZ PLASTIC GRAD STRL LF  DISP (MED SURG SUPPLIES) ×2 IMPLANT
DRAPE 2 INCS FILM ANTIMIC 23X17IN IOBN STRL SURG (DRAPE/PACKS/SHEETS/OR TOWEL) ×1 IMPLANT
DRAPE 76X55IN 3 QT HALYARD LF  STRL DISP SURG (DRAPE/PACKS/SHEETS/OR TOWEL) ×1 IMPLANT
DRAPE SURGICAL LOWER EXTREMITY LF STRL DISP (DRAPE/PACKS/SHEETS/OR TOWEL) ×1 IMPLANT
DRESS PETRO 9X5IN CURAD XR GAUZE NONADH OCL OVRWRAP FN MESH LF  STRL WHT (WOUND CARE SUPPLY) ×1 IMPLANT
GOWN SURG XL STD LGTH L4 HKLP CLSR RGLN SLEEVE TWL STRL LF  DISP WHT AERO CR PRFRM FBRC (DRAPE/PACKS/SHEETS/OR TOWEL) ×1 IMPLANT
KIT ARTHRO FIX INTERNALBRACE SWIVELOCK STD 4.75MM 3.5MM BIOCOMP RADOPQ (OTHER) ×1 IMPLANT
KIT ENDOS INSTR 1.35-1.6MM DRILL GUIDE GW DISP FIBERTAK DX SUT ANCH (ENDOSCOPIC SUPPLIES) ×1 IMPLANT
KIT RM TURNOVER STPC DISP (DRAPE/PACKS/SHEETS/OR TOWEL) ×1 IMPLANT
KIT SYRG INFUS SET LL CAP TOURNIQUET S2 AW ACP 19GA DISP LF  ALC (MED SURG SUPPLIES) ×1 IMPLANT
LABEL STRL GENERAL_C3333UHCG 100SH/BX (MED SURG SUPPLIES) ×1 IMPLANT
NEEDLE HYPO  18GA 1.5IN TW ECLIPSE POLYPROP REG BVL LL PVT SHIELD MECH DEHP-FR PNK STRL LF  DISP (MED SURG SUPPLIES) IMPLANT
NEEDLE HYPO 23GA 1.5IN TW ECLIPSE BVL ORNT PVT SHIELD LF (MED SURG SUPPLIES) ×1 IMPLANT
PAD ABDOMINAL 9X5IN LF  STRL DISP (WOUND CARE SUPPLY) ×2 IMPLANT
PADDING CAST 4YDX4IN WEBRIL COTTON STRL LF (ORTHOPEDICS (NOT IMPLANTS)) ×6 IMPLANT
PENCIL SMOKEVAC COAT PSHBTN LF (MED SURG SUPPLIES) IMPLANT
PROBE ARTHRO ASPIRATE ABLATOR APOLLORF SM JNT 50D (ENDOSCOPIC SUPPLIES) ×1 IMPLANT
PUMP TUBING 13FT CONT WV III DUALWAVE ARTHRO STRL DISP (MED SURG SUPPLIES) ×1 IMPLANT
SPONGE GAUZE 4X4IN MDCHC COTTON 16 PLY USP TY VII LF  STRL DISP (WOUND CARE SUPPLY) ×2 IMPLANT
STKNT ORTHO 60X6IN COTTON 1 PLY STRL (ORTHOPEDICS (NOT IMPLANTS)) ×1 IMPLANT
TAP 3.5MM CANN AO SURG BONE STRL LF DISP (SURGICAL INSTRUMENTS) ×1 IMPLANT
TIP SUCT ARGYLE CURITY FRZR 12FR BRSS PLASTIC NI REM OBTURATOR NCDTV HNDL PLBL STRL LF  DISP (ENDOSCOPIC SUPPLIES) ×1 IMPLANT
TRAY CUSTOM ORTHO MINOR  - ~~LOC~~ (CUSTOM TRAYS & PACK) ×1 IMPLANT
TUBING SUCT CLR 6FT .25IN MEDIVAC NCDTV PLASTIC MAXIGRIP MALE / MALE CONN STRL LF  DISP (MED SURG SUPPLIES) ×4 IMPLANT

## 2023-05-17 NOTE — Anesthesia Transfer of Care (Signed)
 ANESTHESIA TRANSFER OF CARE   Cody Horn is a 38 y.o. ,male, Weight: 81.2 kg (179 lb)   had Procedure(s) with comments:  ARTHROSCOPIC DEBRIDEMENT, LATERAL LIGAMENT RECONSTRUCTION, REPAIR DISLOCATING PERONEAL TENDONS, REPAIR CHRONIC PERONEAL TENDON TEAR. - ANESTHESIA  BLOCK   ARTHREX ACP, BATTERY DRILL/SAW, SCOPE, INTERNAL BRACE, FIBERTACKS  performed  05/17/23   Primary Service: Ezzie Holstein, MD    Past Medical History:   Diagnosis Date   . Anger    . anxiety    . depression    . History of ADHD    . HTN (hypertension)       Allergy History as of 05/17/23        No Known Allergies                  I completed my transfer of care / handoff to the receiving personnel during which we discussed:  Access, Airway, All key/critical aspects of case discussed, Analgesia, Antibiotics, Expectation of post procedure, Fluids/Product, Gave opportunity for questions and acknowledgement of understanding, Labs and PMHx      Post Location: PACU                                                           Last OR Temp: Temperature: 36.3 C (97.3 F)  ABG:  POTASSIUM   Date Value Ref Range Status   04/20/2023 4.2 3.5 - 5.1 mmol/L Final     KETONES   Date Value Ref Range Status   05/28/2021 Negative Negative mg/dL Final     CALCIUM   Date Value Ref Range Status   04/20/2023 9.8 8.6 - 10.2 mg/dL Final     Comment:     Gadolinium-containing contrast can interfere with calcium measurement.       Calculated P Axis   Date Value Ref Range Status   05/12/2023 39 degrees Final     Calculated R Axis   Date Value Ref Range Status   05/12/2023 0 degrees Final     Calculated T Axis   Date Value Ref Range Status   05/12/2023 40 degrees Final     Airway:* No LDAs found *  Blood pressure 138/82, pulse 88, temperature 36.3 C (97.3 F), resp. rate 16, height 1.626 m (5\' 4" ), weight 81.2 kg (179 lb), SpO2 99%.

## 2023-05-17 NOTE — Anesthesia Procedure Notes (Signed)
 Cody Horn    Block: Peripheral Block    Performed By:   Authorizing Provider:  Evelyn Hire, MD  Performing Provider:  Evelyn Hire, MD       Sedation  The patient was continuously monitored throughout the procedure and in recovery. I was in attendance and supervised the sedation (during the start and stop times listed below) and remained immediately available until the patient returned to pre-procedure baseline.  Evelyn Hire, MD 05/17/2023, 11:00      Blocks  Right Adductor femoral by anesthesia At Bedside  Type of Block: single shot       Diagnosis: shoulder pain   Indication: Requested by surgeon and Acute post operative pain   Pt location: at Bedside    Pre anesthesia checklist: H&P updated and consent obtained, Patient positioned, Patient monitors applied, Timeout performed:, Site verified, Emergency drugs and equipment available and anesthesia consent given  Technique(See MAR for doses)  Approach:  Peripheral Block   Preprocedure hand washing was performed sterile field maintained      Sterile Skin Prep : Aseptic technique    Skin prepped with: Chlorhexidine gluconate    Skin Local      Needle  Needle type: Stimuplex     Needle Gauge: 20G   Needle length:  Needle localization: ultrasound guidance  Number of attempts: 1      Catheter          Site      Medications  Medications:  BUPivacaine PF (MARCAINE) 0.5% injection - Infiltration   10 mL - 05/17/2023 11:00:00 AM  Assessment  Injection assessment: negative aspiration for heme and local visualized surrounding nerve on ultrasound          Events     Patient tolerance of procedure: tolerated well, no immediate complications

## 2023-05-17 NOTE — Anesthesia Postprocedure Evaluation (Signed)
 Anesthesia Post Op Evaluation    Patient: Cody Horn  Procedure(s) with comments:  ARTHROSCOPIC DEBRIDEMENT, LATERAL LIGAMENT RECONSTRUCTION, REPAIR DISLOCATING PERONEAL TENDONS, REPAIR CHRONIC PERONEAL TENDON TEAR. - ANESTHESIA  BLOCK   ARTHREX ACP, BATTERY DRILL/SAW, SCOPE, INTERNAL BRACE, FIBERTACKS    Last Vitals:Temperature: 36.3 C (97.3 F) (05/17/23 1343)  Heart Rate: 82 (05/17/23 1345)  BP (Non-Invasive): (!) 129/101 (05/17/23 1345)  Respiratory Rate: (!) 22 (05/17/23 1345)  SpO2: 100 % (05/17/23 1345)    No notable events documented.    Patient is sufficiently recovered from the effects of anesthesia to participate in the evaluation and has returned to their pre-procedure level.  Patient location during evaluation: PACU       Patient participation: complete - patient participated  Level of consciousness: awake and alert and responsive to verbal stimuli    Pain management: adequate  Airway patency: patent    Anesthetic complications: no  Cardiovascular status: acceptable  Respiratory status: acceptable  Hydration status: acceptable  Patient post-procedure temperature: Pt Normothermic   PONV Status: Absent

## 2023-05-17 NOTE — Brief Op Note (Signed)
 [place]  [facility]                                                     BRIEF OPERATIVE NOTE    Patient Name: Cody Horn, Cody Horn Number: [id number]  Date of Service: 05/17/2023   Date of Birth: 02/05/1985    All elements must be documented.    Pre-Operative Diagnosis:  Right ankle arthritis, ankle instability, dislocating peroneal tendons and peroneal tendon tear   Post-Operative Diagnosis:  Same  Procedure(s)/Description:  Right ankle arthroscopic debridement with lateral ligament reconstruction, repair of dislocating peroneal tendons and repair of chronic peroneal tendon tear    Attending Surgeon: Dr Gorman Laughter T. Kris No, MD   Assistant(s): Abundio Abts PA-C     Anesthesia Type: Monitored Anesthesia Care (MAC) / Block  Estimated Blood Less:  Minimal  Complications (not routinely expected or not inherent to difficulty/nature of procedure):  None    Specimens/ Cultures:  None           Disposition: PACU - hemodynamically stable.  Condition: stable    Abundio Abts, PA-C

## 2023-05-17 NOTE — Anesthesia Procedure Notes (Signed)
 Cody Horn    Block: Peripheral Block    Performed By:   Authorizing Provider:  Evelyn Hire, MD  Performing Provider:  Evelyn Hire, MD       Sedation        Blocks  Right sciatic Popliteal Fossa by anesthesia At Bedside  Type of Block: single shot    Ultrasound was used to identify the nerve structure(s) along with needle placement during the procedure  Image:  Images available in PACS    Indication: Requested by surgeon and Acute post operative pain   Pt location: at Bedside    Pre anesthesia checklist: H&P updated and consent obtained, Patient positioned, Patient monitors applied, Timeout performed:, Site verified, Emergency drugs and equipment available and anesthesia consent given  Technique(See MAR for doses)  Approach:  Peripheral Block   Preprocedure hand washing was performed sterile field maintained          Skin prepped with: Chlorhexidine gluconate    Skin Local      Needle  Needle type: Stimuplex     Needle Gauge: 20G   Needle length:  Needle localization: ultrasound guidance  Number of attempts: 1      Catheter          Site      Medications  Medications:  BUPivacaine liposomal (PF) (EXPAREL) 133 mg/10 mL local injection - Local Infiltration   10 mL - 05/17/2023 10:59:00 AM  BUPivacaine PF (MARCAINE) 0.5% injection - Infiltration   10 mL - 05/17/2023 10:59:00 AM  Assessment  Injection assessment: negative aspiration for heme and local visualized surrounding nerve on ultrasound          Events     Patient tolerance of procedure: tolerated well, no immediate complications

## 2023-05-17 NOTE — Interval H&P Note (Signed)
 [facility]      H&P UPDATE FORM                                                                                  Cody Horn, Cody Horn, 38 y.o. male  Date of Admission:  05/17/2023  Date of Birth:  26-Nov-1985    05/17/2023    STOP: IF H&P IS GREATER THAN 30 DAYS FROM SURGICAL DAY COMPLETE NEW H&P IS REQUIRED.     H & P updated the day of the procedure.  1.  H&P completed within 30 days of surgical procedure and has been reviewed within 24 hours of admission but prior to surgery or a procedure requiring anesthesia services, the patient has been examined, and no change has occured in the patients condition since the H&P was completed.       Change in medications: No              Comments:     2.  Patient continues to be appropriate candidate for planned surgical procedure. YES    Abundio Abts, PA-C

## 2023-05-17 NOTE — Anesthesia Preprocedure Evaluation (Addendum)
 ANESTHESIA PRE-OP EVALUATION  Planned Procedure: ARTHROSCOPIC DEBRIDEMENT, LATERAL LIGAMENT RECONSTRUCTION, REPAIR DISLOCATING PERONEAL TENDONS, REPAIR CHRONIC PERONEAL TENDON TEAR. (Right)  Review of Systems                   Pulmonary   sleep apnea (Not diagnosed but patient is snoring while awake and supine.),   Cardiovascular    Hypertension ,No peripheral edema,        GI/Hepatic/Renal           Endo/Other         Neuro/Psych/MS    depression     Cancer                        Physical Assessment      Airway       Mallampati: III    TM distance: <3 FB    Mouth Opening: fair.  Facial hair  Beard  No endotracheal tube present      Dental       Dentition intact             Pulmonary    Breath sounds clear to auscultation  (-) no rhonchi, no decreased breath sounds, no wheezes, no rales and no stridor     Cardiovascular    Rhythm: regular  Rate: Normal  (-) no friction rub, carotid bruit is not present, no peripheral edema and no murmur     Other findings              Plan  ASA 3     Planned anesthesia type: MAC                     Additional Plans: Pre-op Block    Intravenous induction     Anesthesia issues/risks discussed are: Dental Injuries, Eye /Visual Loss, Post-op Pain Management, Post-op Cognitive Dysfunction, Cardiac Events/MI, Post-op Agitation/Tantrum, Post-op Intubation/Ventilation, Stroke, Intraoperative Awareness/ Recall, Blood Loss, Aspiration, Difficult Airway, Sore Throat and PONV.  Anesthetic plan and risks discussed with patient           Patient's NPO status is appropriate for Anesthesia.           Plan discussed with CRNA.

## 2023-05-18 NOTE — OR Surgeon (Signed)
 PATIENT NAME: Cody Horn, Cody Horn   HOSPITAL NUMBER:  [id number]  DATE OF SERVICE: 05/17/2023  DATE OF BIRTH:  December 20, 1985    OPERATIVE REPORT    PREOPERATIVE DIAGNOSES:  1. Right ankle arthritis.  2. Right chronic ankle instability.  3. Right chronically dislocating peroneal tendon.  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    POSTOPERATIVE DIAGNOSES:  1. Right ankle arthritis.  2. Right chronic ankle instability.  3. Right chronically dislocating peroneal tendon.  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    NAME OF PROCEDURE:  1. Right ankle arthroscopy with extensive debridement.  2. Right ankle ligament reconstruction with a secondary graft reconstruction.  3. Repair of the dislocating peroneal tendon without a groove deepening procedure.   4. Repair of a chronic peroneal tendon tear.    SURGEON:  Ezzie Holstein, MD.    ASSISTANT:  Abundio Abts, PA-C.    ANESTHESIA:  Popiteal block with general.    ESTIMATED BLOOD LOSS:  Minimal.    COMPLICATIONS:  None.    INDICATIONS FOR PROCEDURE:  This is a pleasant male with the above-mentioned chronic deformity.  Operative and nonoperative interventions were explained in detail.  Informed consent was obtained.    DESCRIPTION OF PROCEDURE:  The patient was brought to the operating room and placed supine on the operating table.  After the induction of appropriate anesthesia, the right leg was prepped and draped in standard surgical fashion.  The right leg was Esmarched and a calf tourniquet inflated to 250 mmHg.  At this point, an Arthrex ankle distractor was applied and medial and lateral arthroscopic portals were created.  A full and extensive debridement of the ankle to include the anterior medial, anterior lateral, posterior medial and posterior lateral gutters was completed.  Once a full and extensive debridement of the ankle was completed arthroscopically, the arthroscope was removed.  At this point, a lateral incision was made over the ankle and dissection was  carried down.  The anterior talofibular ligament and calcaneofibular ligament were removed off the anterior edge of the fibula.  The anterior edge of the fibula was prepared.  At this point the anterior talofibular ligaments and calcaneofibular ligaments were reapproximated to the anterior edge of the fibula using Arthrex FiberTak.  Once the anterior talofibular ligaments and calcaneofibular ligaments were reconstructed, then attention was turned to the secondary repair.  An Arthrex internal brace was placed from the lateral wall of the talus into the fibula and secured at each end with Bio-Tenodesis screws.  The extensor digitorum brevis fascia was now harvested and used as a secondary graft and pulled over the primary repair to complete a secondary reconstruction of the lateral ligament.  The ankle was tested with an anterior drawer and talar tilt and noted to be stable.  Once this was completed then the incision was carried posteriorly to expose the peroneal retinaculum and the peroneal tendons.  The peroneal tendons were opened and both the peroneus longus and brevis tendons were exposed.  The peroneus brevis tendon had a long peroneal splint within the tendon itself.  The splint was debrided and the peroneal tendon repaired by tubularization with a 2-0 Vicryl suture.  Once this was completed, then the peroneal retinaculum itself was taken down.  The peroneal retinaculum was noted to be markedly attenuated allowing subluxing of the peroneal tendon.  The posterior aspect of the fibula was debrided down to good tissue using Arthrex FiberTak and the peroneal retinaculum was reconstructed.  At the completion of  the reconstruction, the peroneals were taken through a full range of motion and noted to no longer sublux over the fibula.  At this point, all wounds were copiously irrigated and closed with Vicryl and nylon sutures.  Sterile dressings were applied.  A postop splint was applied.  The patient was sent to the  recovery room in good condition.      I, Dr. Ezzie Holstein, was physically present throughout the entire case and assisted by Abundio Abts, PA-C, throughout the entire procedure.    ASSISTANT PARTICIPATION AND MEDICAL NECESSITY: The nature of this procedure required an assistant for the successful completion of this surgery. The presence of the PA-C/Assistant is utilized for crucial parts of this procedure such as incision, retraction, closure and an additional pair of skilled hands used throughout the entire surgical process. I was present for the entire case.    The patient will be nonweightbearing in a splint for 3 weeks.  I will see him back in the office in 3 weeks and take down the dressings.  I will check the wounds.  No need for x-rays on the first visit.  If everything looks good, I will put him in a removable boot and he can begin weightbearing as tolerated in a removable boot.  At 6 weeks I will see him back.  I will reexamine him.  If everything looks good, I will get him out of the boot, into physical therapy and in a BOA brace.        Ezzie Holstein, MD                DD:  05/17/2023 16:53:14  DT:  05/18/2023 05:24:38 LL  D#:  [id number]

## 2023-06-09 ENCOUNTER — Other Ambulatory Visit: Payer: Self-pay

## 2023-06-09 ENCOUNTER — Ambulatory Visit: Payer: Self-pay | Attending: Physician Assistant | Admitting: Physician Assistant

## 2023-06-09 ENCOUNTER — Encounter (HOSPITAL_BASED_OUTPATIENT_CLINIC_OR_DEPARTMENT_OTHER): Payer: Self-pay | Admitting: Physician Assistant

## 2023-06-09 VITALS — Ht 64.0 in

## 2023-06-09 DIAGNOSIS — M7671 Peroneal tendinitis, right leg: Secondary | ICD-10-CM | POA: Insufficient documentation

## 2023-06-09 DIAGNOSIS — Z9889 Other specified postprocedural states: Secondary | ICD-10-CM

## 2023-06-22 NOTE — Progress Notes (Signed)
 Cody Horn presents to the office s/p right ankle arthroscopic debridement lateral ligament reconstruction repair of dislocating peroneal tendons  done by Dr. Allan Horn on May 17, 2023. He reports pain 0/10. He has been nonweightbearing in his splint.  He has been taking aspirin  for deep vein thrombosis prophylaxis denies any shortness of breath or calf pain    Physical Exam: Cody Horn is a pleasant 38 y.o. male in no acute distress. Dressing was removed, sutures were removed. Incision is appears to be healing well with good reapproximation and shows no evidence of infection, separation, or keloid formation. Swelling is mild.The foot and ankle are in good alignment. Patient is able to dorsiflex +5.  The ankle is stable on exam.. Calf is soft and non-tender. Distal sensation and pulses are intact.     Imaging: X-rays were not obtained    Assessment and Plan: It was discussed with Cody Horn that he is doing well. He will now be weight-bearing as tolerated in a boot which we will place him into he had to help stabilize the ankle while the ligaments are healing.  He will return to work on June 2nd light duty taking breaks as needed.  He should continue to elevate and ice.  He can shower with soap and water , no soaking until incisions are fully healed. No lotions or creams  on the incision until fully healed.  Can do active plantar and dorsiflexion exercises of the ankle. Pain medication not refilled.   He will return in 3 weeks to see Dr Cody Horn at that time no x-rays will be obtained. All of their questions were answered to their satisfaction    This visit was rendered independently without a supervising/collaborating physician on site.

## 2023-06-27 NOTE — Addendum Note (Signed)
 Addendum  created 06/27/23 0913 by Evelyn Hire, MD    Intraprocedure Staff edited

## 2023-06-28 NOTE — Addendum Note (Signed)
 Addendum  created 06/28/23 1004 by Evelyn Hire, MD    Attestation recorded in [place], Intraprocedure Attestations filed

## 2023-07-13 ENCOUNTER — Ambulatory Visit (HOSPITAL_BASED_OUTPATIENT_CLINIC_OR_DEPARTMENT_OTHER): Payer: Self-pay | Admitting: Specialist
